# Patient Record
Sex: Female | Born: 1935 | Race: White | Hispanic: No | Smoking: Never smoker
Health system: Southern US, Community
[De-identification: ages and names within clinical notes are randomized; demographics above are authoritative.]

## PROBLEM LIST (undated history)

## (undated) DIAGNOSIS — R51 Headache: Secondary | ICD-10-CM

## (undated) DIAGNOSIS — F419 Anxiety disorder, unspecified: Secondary | ICD-10-CM

## (undated) DIAGNOSIS — E039 Hypothyroidism, unspecified: Secondary | ICD-10-CM

## (undated) DIAGNOSIS — Z9889 Other specified postprocedural states: Secondary | ICD-10-CM

## (undated) DIAGNOSIS — R42 Dizziness and giddiness: Secondary | ICD-10-CM

## (undated) DIAGNOSIS — R112 Nausea with vomiting, unspecified: Secondary | ICD-10-CM

## (undated) DIAGNOSIS — K219 Gastro-esophageal reflux disease without esophagitis: Secondary | ICD-10-CM

## (undated) DIAGNOSIS — J45909 Unspecified asthma, uncomplicated: Secondary | ICD-10-CM

## (undated) DIAGNOSIS — I10 Essential (primary) hypertension: Secondary | ICD-10-CM

## (undated) DIAGNOSIS — Z972 Presence of dental prosthetic device (complete) (partial): Secondary | ICD-10-CM

## (undated) DIAGNOSIS — M1990 Unspecified osteoarthritis, unspecified site: Secondary | ICD-10-CM

## (undated) HISTORY — PX: CARDIAC CATHETERIZATION: SHX172

## (undated) HISTORY — PX: TONSILLECTOMY: SUR1361

## (undated) HISTORY — PX: JOINT REPLACEMENT: SHX530

---

## 2004-03-23 ENCOUNTER — Ambulatory Visit: Payer: Self-pay | Admitting: Unknown Physician Specialty

## 2004-04-27 ENCOUNTER — Ambulatory Visit: Payer: Self-pay | Admitting: Unknown Physician Specialty

## 2004-04-28 ENCOUNTER — Ambulatory Visit: Payer: Self-pay | Admitting: Unknown Physician Specialty

## 2004-06-28 ENCOUNTER — Ambulatory Visit: Payer: Self-pay | Admitting: Unknown Physician Specialty

## 2004-09-29 ENCOUNTER — Ambulatory Visit: Payer: Self-pay | Admitting: Unknown Physician Specialty

## 2006-07-03 ENCOUNTER — Ambulatory Visit: Payer: Self-pay | Admitting: Specialist

## 2006-08-01 ENCOUNTER — Ambulatory Visit: Payer: Self-pay | Admitting: Unknown Physician Specialty

## 2006-11-05 ENCOUNTER — Ambulatory Visit: Payer: Self-pay | Admitting: Specialist

## 2006-11-05 ENCOUNTER — Other Ambulatory Visit: Payer: Self-pay

## 2006-11-20 ENCOUNTER — Inpatient Hospital Stay: Payer: Self-pay | Admitting: Specialist

## 2006-11-23 ENCOUNTER — Encounter: Payer: Self-pay | Admitting: Internal Medicine

## 2007-04-29 ENCOUNTER — Ambulatory Visit: Payer: Self-pay | Admitting: Unknown Physician Specialty

## 2007-05-29 ENCOUNTER — Ambulatory Visit: Payer: Self-pay | Admitting: Unknown Physician Specialty

## 2007-06-06 ENCOUNTER — Ambulatory Visit: Payer: Self-pay | Admitting: Unknown Physician Specialty

## 2007-08-06 ENCOUNTER — Ambulatory Visit: Payer: Self-pay | Admitting: Unknown Physician Specialty

## 2008-03-13 IMAGING — CR DG KNEE 1-2V*R*
1 series · 2 of 2 positions shown · non-contrast
Comparison: none

REASON FOR EXAM: post op
COMMENTS:   Bedside (portable):Y

PROCEDURE:     DXR - DXR KNEE RIGHT AP AND LATERAL  - November 20, 2006 [DATE]
RESULT:     Comparison: No available comparison exam.

[Series 1: view not recorded · 0.17mm/px · 2 of 2 slices shown]
[im 1/2]
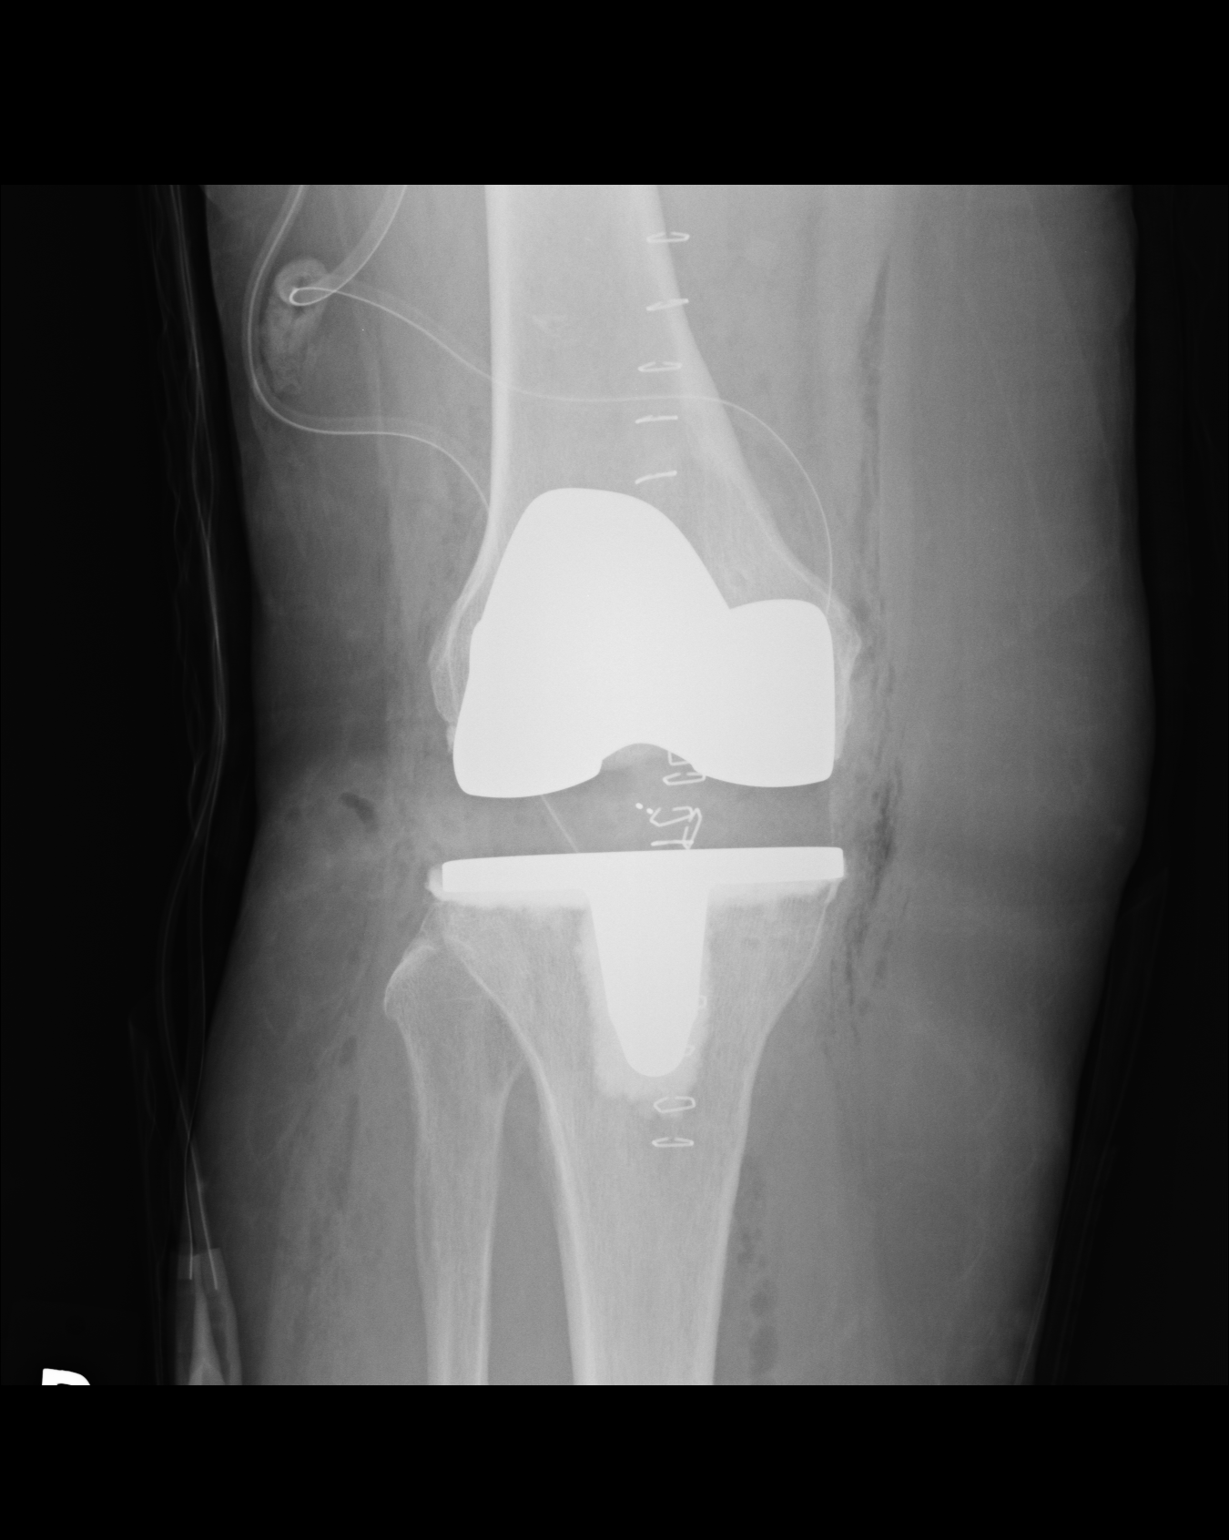
[im 2/2]
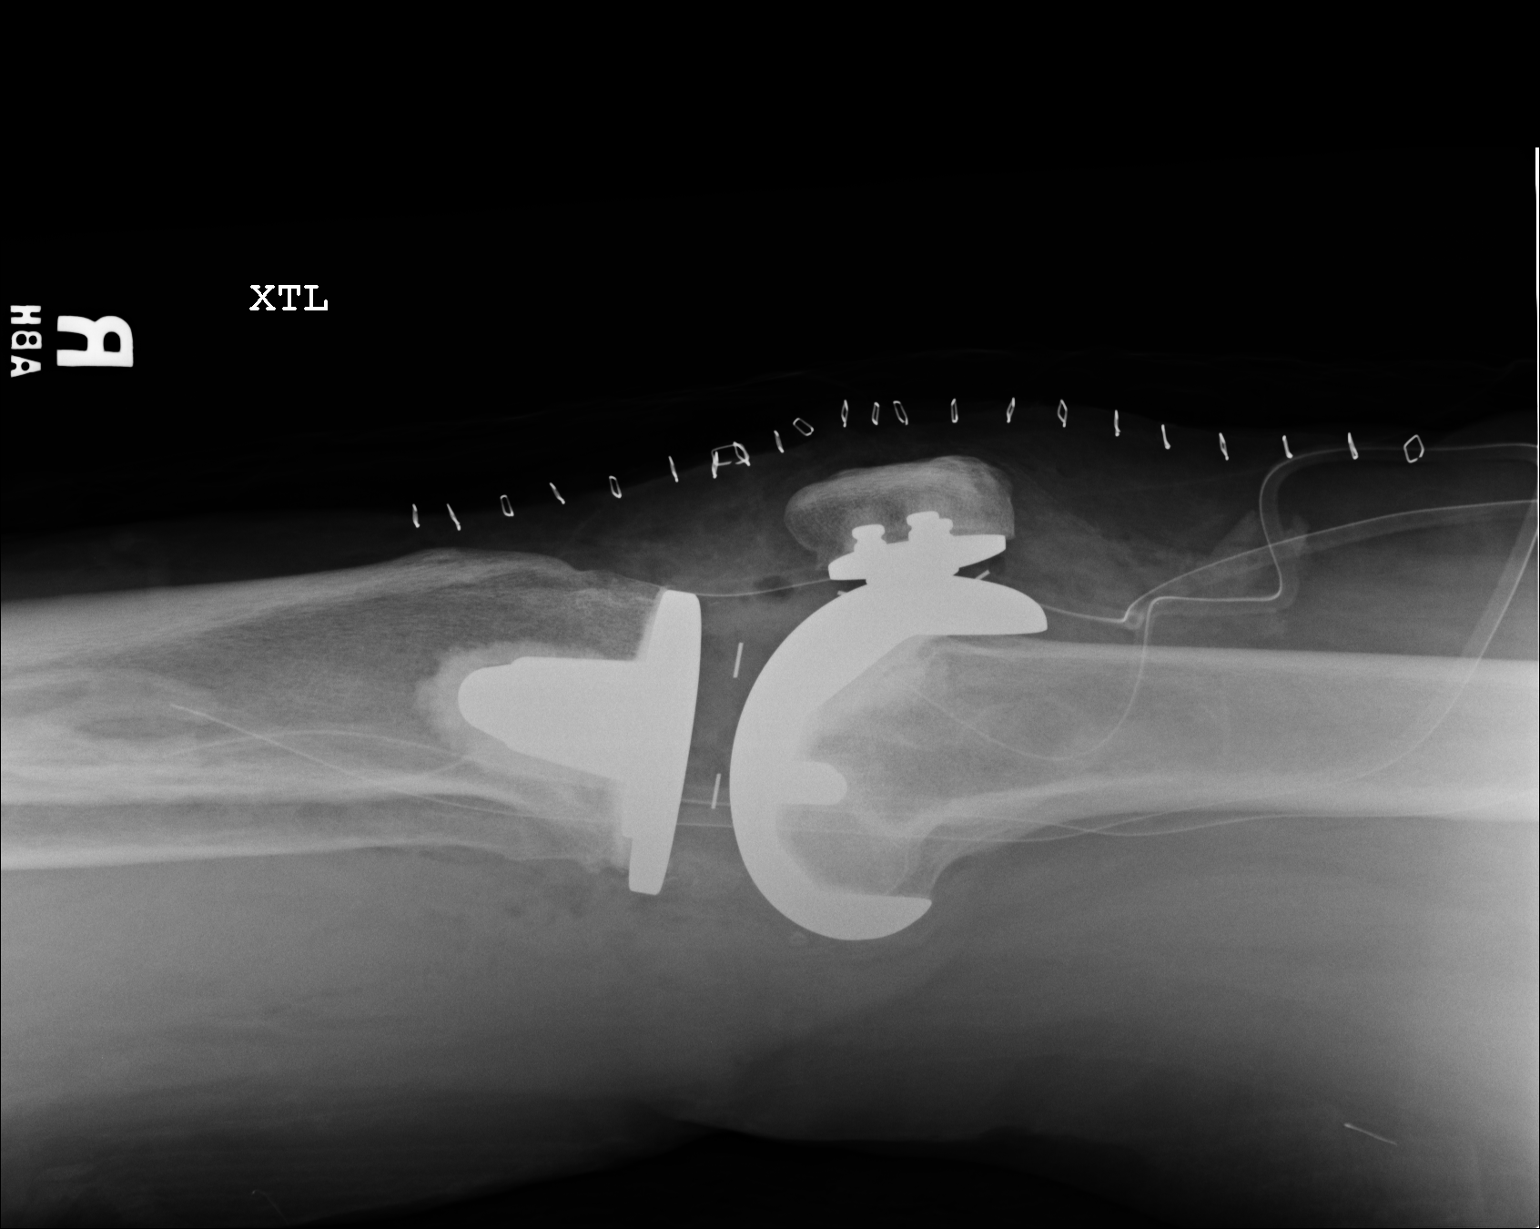

[2 of 2 positions shown; findings below may reference images not displayed]

FINDINGS: Two views of the right knee were obtained.

Right total knee arthroplasty is seen with hardware in good position and
alignment. Associated postoperative changes are noted with soft tissue gas,
2 surgical drains, and skin staples.
IMPRESSION: 1. Right total knee arthroplasty with associated post operative changes.

## 2008-08-11 ENCOUNTER — Ambulatory Visit: Payer: Self-pay | Admitting: Unknown Physician Specialty

## 2008-10-20 ENCOUNTER — Ambulatory Visit: Payer: Self-pay | Admitting: Unknown Physician Specialty

## 2008-10-22 ENCOUNTER — Ambulatory Visit: Payer: Self-pay | Admitting: Unknown Physician Specialty

## 2009-10-11 ENCOUNTER — Ambulatory Visit: Payer: Self-pay | Admitting: Unknown Physician Specialty

## 2010-10-18 ENCOUNTER — Ambulatory Visit: Payer: Self-pay | Admitting: Unknown Physician Specialty

## 2011-07-25 ENCOUNTER — Ambulatory Visit: Payer: Self-pay | Admitting: Physical Medicine and Rehabilitation

## 2011-10-19 ENCOUNTER — Ambulatory Visit: Payer: Self-pay | Admitting: Internal Medicine

## 2011-12-26 ENCOUNTER — Ambulatory Visit: Payer: Self-pay | Admitting: Specialist

## 2011-12-26 DIAGNOSIS — I10 Essential (primary) hypertension: Secondary | ICD-10-CM

## 2011-12-26 LAB — URINALYSIS, COMPLETE
Bacteria: NONE SEEN
Bilirubin,UR: NEGATIVE
Blood: NEGATIVE
Glucose,UR: NEGATIVE mg/dL (ref 0–75)
Ketone: NEGATIVE
Specific Gravity: 1.016 (ref 1.003–1.030)
Squamous Epithelial: 2
WBC UR: 22 /HPF (ref 0–5)

## 2011-12-26 LAB — CBC
HCT: 38.3 % (ref 35.0–47.0)
HGB: 13.2 g/dL (ref 12.0–16.0)
MCH: 33 pg (ref 26.0–34.0)
MCHC: 34.6 g/dL (ref 32.0–36.0)
MCV: 96 fL (ref 80–100)
RBC: 4.01 10*6/uL (ref 3.80–5.20)

## 2011-12-26 LAB — BASIC METABOLIC PANEL
Calcium, Total: 9.1 mg/dL (ref 8.5–10.1)
Chloride: 101 mmol/L (ref 98–107)
Co2: 29 mmol/L (ref 21–32)
Osmolality: 274 (ref 275–301)
Potassium: 4.1 mmol/L (ref 3.5–5.1)
Sodium: 135 mmol/L — ABNORMAL LOW (ref 136–145)

## 2012-01-02 ENCOUNTER — Inpatient Hospital Stay: Payer: Self-pay | Admitting: Specialist

## 2012-01-03 LAB — CBC WITH DIFFERENTIAL/PLATELET
Basophil #: 0 10*3/uL (ref 0.0–0.1)
Basophil %: 0.1 %
Eosinophil #: 0 10*3/uL (ref 0.0–0.7)
HCT: 30.3 % — ABNORMAL LOW (ref 35.0–47.0)
HGB: 10.4 g/dL — ABNORMAL LOW (ref 12.0–16.0)
Lymphocyte #: 1.5 10*3/uL (ref 1.0–3.6)
MCH: 32.6 pg (ref 26.0–34.0)
MCHC: 34.4 g/dL (ref 32.0–36.0)
MCV: 95 fL (ref 80–100)
Monocyte #: 1.2 x10 3/mm — ABNORMAL HIGH (ref 0.2–0.9)
Neutrophil #: 11.2 10*3/uL — ABNORMAL HIGH (ref 1.4–6.5)
RDW: 12.7 % (ref 11.5–14.5)

## 2012-01-03 LAB — BASIC METABOLIC PANEL
Anion Gap: 10 (ref 7–16)
Calcium, Total: 8.2 mg/dL — ABNORMAL LOW (ref 8.5–10.1)
Chloride: 97 mmol/L — ABNORMAL LOW (ref 98–107)
Glucose: 126 mg/dL — ABNORMAL HIGH (ref 65–99)
Osmolality: 269 (ref 275–301)
Potassium: 3.8 mmol/L (ref 3.5–5.1)
Sodium: 132 mmol/L — ABNORMAL LOW (ref 136–145)

## 2012-01-04 ENCOUNTER — Encounter: Payer: Self-pay | Admitting: Internal Medicine

## 2012-01-04 LAB — HEMOGLOBIN: HGB: 9.6 g/dL — ABNORMAL LOW (ref 12.0–16.0)

## 2012-01-14 ENCOUNTER — Encounter: Payer: Self-pay | Admitting: Internal Medicine

## 2012-03-22 ENCOUNTER — Ambulatory Visit: Payer: Self-pay | Admitting: Specialist

## 2012-04-01 ENCOUNTER — Ambulatory Visit: Payer: Self-pay | Admitting: Specialist

## 2012-04-01 LAB — CBC WITH DIFFERENTIAL/PLATELET
Basophil #: 0 10*3/uL (ref 0.0–0.1)
Basophil %: 0.6 %
Eosinophil #: 0.2 10*3/uL (ref 0.0–0.7)
HCT: 37.8 % (ref 35.0–47.0)
Lymphocyte %: 33 %
MCHC: 33.3 g/dL (ref 32.0–36.0)
Neutrophil %: 55.6 %
Platelet: 250 10*3/uL (ref 150–440)
RDW: 13.4 % (ref 11.5–14.5)
WBC: 7.4 10*3/uL (ref 3.6–11.0)

## 2012-04-01 LAB — BASIC METABOLIC PANEL
Anion Gap: 6 — ABNORMAL LOW (ref 7–16)
BUN: 20 mg/dL — ABNORMAL HIGH (ref 7–18)
Chloride: 102 mmol/L (ref 98–107)
Co2: 27 mmol/L (ref 21–32)
Creatinine: 1.28 mg/dL (ref 0.60–1.30)
EGFR (African American): 47 — ABNORMAL LOW
Sodium: 135 mmol/L — ABNORMAL LOW (ref 136–145)

## 2012-04-09 ENCOUNTER — Ambulatory Visit: Payer: Self-pay | Admitting: Specialist

## 2012-04-14 HISTORY — PX: SHOULDER ARTHROSCOPY WITH OPEN ROTATOR CUFF REPAIR: SHX6092

## 2012-10-21 ENCOUNTER — Ambulatory Visit: Payer: Self-pay | Admitting: Internal Medicine

## 2012-12-11 ENCOUNTER — Ambulatory Visit: Payer: Self-pay | Admitting: Physical Medicine and Rehabilitation

## 2012-12-25 ENCOUNTER — Other Ambulatory Visit: Payer: Self-pay | Admitting: Neurosurgery

## 2013-01-04 NOTE — Pre-Procedure Instructions (Signed)
Cynthia Mahoney  01/04/2013   Your procedure is scheduled on:  August 27  Report to [facility] at 06:30 AM.  Call this number if you have problems the morning of surgery: [phone]   Remember:   Do not eat food or drink liquids after midnight.   Take these medicines the morning of surgery with A SIP OF WATER: Eye drops, Gabapentin, Hydrocodone (if needed), Levothyroxine, Lorazepam (if needed), Metoprolol, Omeprazole,    STOP Multiple Vitamin, Meloxicam, Magnesium, Calcium, Asprin today  Do not take Aspirin, Aleve, Naproxen, Advil, Ibuprofen, Vitamin, Herbs, or Supplements starting today  Do not wear jewelry, make-up or nail polish.  Do not wear lotions, powders, or perfumes. You may wear deodorant.  Do not shave 48 hours prior to surgery. Men may shave face and neck.  Do not bring valuables to the hospital.  [facility] is not responsible                   for any belongings or valuables.  Contacts, dentures or bridgework may not be worn into surgery.  Leave suitcase in the car. After surgery it may be brought to your room.  For patients admitted to the hospital, checkout time is 11:00 AM the day of  discharge.   Special Instructions: Shower using CHG 2 nights before surgery and the night before surgery.  If you shower the day of surgery use CHG.  Use special wash - you have one bottle of CHG for all showers.  You should use approximately 1/3 of the bottle for each shower.   Please read over the following fact sheets that you were given: Pain Booklet, Coughing and Deep Breathing, Blood Transfusion Information and Surgical Site Infection Prevention

## 2013-01-06 ENCOUNTER — Encounter (HOSPITAL_COMMUNITY): Payer: Self-pay

## 2013-01-06 ENCOUNTER — Encounter (HOSPITAL_COMMUNITY)
Admission: RE | Admit: 2013-01-06 | Discharge: 2013-01-06 | Disposition: A | Discharge status: Home / Self Care | Payer: Medicare Other | Source: Ambulatory Visit | Attending: Neurosurgery | Admitting: Neurosurgery

## 2013-01-06 ENCOUNTER — Ambulatory Visit (HOSPITAL_COMMUNITY)
Admission: RE | Admit: 2013-01-06 | Discharge: 2013-01-06 | Disposition: A | Discharge status: Home / Self Care | Payer: Medicare Other | Source: Ambulatory Visit | Attending: Anesthesiology | Admitting: Anesthesiology

## 2013-01-06 DIAGNOSIS — I10 Essential (primary) hypertension: Secondary | ICD-10-CM | POA: Insufficient documentation

## 2013-01-06 HISTORY — DX: Anxiety disorder, unspecified: F41.9

## 2013-01-06 HISTORY — DX: Other specified postprocedural states: Z98.890

## 2013-01-06 HISTORY — DX: Hypothyroidism, unspecified: E03.9

## 2013-01-06 HISTORY — DX: Unspecified osteoarthritis, unspecified site: M19.90

## 2013-01-06 HISTORY — DX: Headache: R51

## 2013-01-06 HISTORY — DX: Nausea with vomiting, unspecified: R11.2

## 2013-01-06 HISTORY — DX: Gastro-esophageal reflux disease without esophagitis: K21.9

## 2013-01-06 HISTORY — DX: Essential (primary) hypertension: I10

## 2013-01-06 LAB — BASIC METABOLIC PANEL
CO2: 27 mEq/L (ref 19–32)
Calcium: 9.9 mg/dL (ref 8.4–10.5)
GFR calc Af Amer: 65 mL/min — ABNORMAL LOW (ref >90)
GFR calc non Af Amer: 56 mL/min — ABNORMAL LOW (ref >90)
Sodium: 131 mEq/L — ABNORMAL LOW (ref 135–145)

## 2013-01-06 LAB — TYPE AND SCREEN
ABO/RH(D): A POS
Antibody Screen: NEGATIVE

## 2013-01-06 LAB — CBC
MCV: 91.5 fL (ref 78.0–100.0)
Platelets: 324 10*3/uL (ref 150–400)
RBC: 4.11 MIL/uL (ref 3.87–5.11)
WBC: 8.1 10*3/uL (ref 4.0–10.5)

## 2013-01-06 LAB — SURGICAL PCR SCREEN: Staphylococcus aureus: POSITIVE — AB

## 2013-01-06 LAB — ABO/RH: ABO/RH(D): A POS

## 2013-01-07 ENCOUNTER — Other Ambulatory Visit (HOSPITAL_COMMUNITY): Payer: Medicare Other

## 2013-01-07 ENCOUNTER — Encounter (HOSPITAL_COMMUNITY): Payer: Self-pay | Admitting: Pharmacy Technician

## 2013-01-07 ENCOUNTER — Other Ambulatory Visit: Payer: Self-pay | Admitting: Neurosurgery

## 2013-01-07 MED ORDER — DEXTROSE 5 % IV SOLN: 2.0000 g | INTRAVENOUS | Status: DC

## 2013-01-08 ENCOUNTER — Inpatient Hospital Stay (HOSPITAL_COMMUNITY): Payer: Medicare Other | Admitting: *Deleted

## 2013-01-08 ENCOUNTER — Inpatient Hospital Stay (HOSPITAL_COMMUNITY): Payer: Medicare Other

## 2013-01-08 ENCOUNTER — Inpatient Hospital Stay (HOSPITAL_COMMUNITY)
Admission: RE | Admit: 2013-01-08 | Discharge: 2013-01-14 | DRG: 460 | Disposition: A | Discharge status: Skilled Nursing Facility | Payer: Medicare Other | Source: Ambulatory Visit | Attending: Neurosurgery | Admitting: Neurosurgery

## 2013-01-08 ENCOUNTER — Encounter (HOSPITAL_COMMUNITY): Admission: RE | Payer: Self-pay | Source: Ambulatory Visit

## 2013-01-08 ENCOUNTER — Encounter (HOSPITAL_COMMUNITY)
Admission: RE | Disposition: A | Discharge status: Skilled Nursing Facility | Payer: Self-pay | Source: Ambulatory Visit | Attending: Neurosurgery

## 2013-01-08 ENCOUNTER — Encounter (HOSPITAL_COMMUNITY): Payer: Self-pay | Admitting: *Deleted

## 2013-01-08 ENCOUNTER — Inpatient Hospital Stay (HOSPITAL_COMMUNITY): Admission: RE | Admit: 2013-01-08 | Payer: Medicare Other | Source: Ambulatory Visit | Admitting: Neurosurgery

## 2013-01-08 DIAGNOSIS — F411 Generalized anxiety disorder: Secondary | ICD-10-CM | POA: Diagnosis present

## 2013-01-08 DIAGNOSIS — M48062 Spinal stenosis, lumbar region with neurogenic claudication: Principal | ICD-10-CM | POA: Diagnosis present

## 2013-01-08 DIAGNOSIS — E039 Hypothyroidism, unspecified: Secondary | ICD-10-CM | POA: Diagnosis present

## 2013-01-08 DIAGNOSIS — I10 Essential (primary) hypertension: Secondary | ICD-10-CM | POA: Diagnosis present

## 2013-01-08 DIAGNOSIS — Z96659 Presence of unspecified artificial knee joint: Secondary | ICD-10-CM

## 2013-01-08 DIAGNOSIS — K219 Gastro-esophageal reflux disease without esophagitis: Secondary | ICD-10-CM | POA: Diagnosis present

## 2013-01-08 DIAGNOSIS — M129 Arthropathy, unspecified: Secondary | ICD-10-CM | POA: Diagnosis present

## 2013-01-08 DIAGNOSIS — Q762 Congenital spondylolisthesis: Secondary | ICD-10-CM

## 2013-01-08 SURGERY — POSTERIOR LUMBAR FUSION 1 LEVEL
Anesthesia: General

## 2013-01-08 SURGERY — POSTERIOR LUMBAR FUSION 1 LEVEL
Anesthesia: General | Site: Back | Wound class: Clean

## 2013-01-08 MED ORDER — ONDANSETRON HCL 4 MG/2ML IJ SOLN: 4.0000 mg | Freq: Four times a day (QID) | INTRAMUSCULAR | Status: DC | PRN

## 2013-01-08 MED ORDER — GABAPENTIN 300 MG PO CAPS: 300.0000 mg | ORAL_CAPSULE | Freq: Two times a day (BID) | ORAL | Status: DC

## 2013-01-08 MED ORDER — OXYCODONE-ACETAMINOPHEN 5-325 MG PO TABS
1.0000 | ORAL_TABLET | ORAL | Status: DC | PRN
  Administered 2013-01-09 – 2013-01-13 (×5): 2 via ORAL
  Administered 2013-01-13: 1 via ORAL
  Administered 2013-01-14 (×4): 2 via ORAL
  Filled 2013-01-08 (×11): qty 2

## 2013-01-08 MED ORDER — LACTATED RINGERS IV SOLN
INTRAVENOUS | Status: DC
  Administered 2013-01-08: 08:00:00 via INTRAVENOUS

## 2013-01-08 MED ORDER — TRIAMTERENE-HCTZ 37.5-25 MG PO CAPS
1.0000 | ORAL_CAPSULE | Freq: Every day | ORAL | Status: DC
  Administered 2013-01-08 – 2013-01-14 (×7): 1 via ORAL
  Filled 2013-01-08 (×7): qty 1

## 2013-01-08 MED ORDER — ARTIFICIAL TEARS OP OINT
TOPICAL_OINTMENT | OPHTHALMIC | Status: DC | PRN
  Administered 2013-01-08: 1 via OPHTHALMIC

## 2013-01-08 MED ORDER — ACETAMINOPHEN 650 MG RE SUPP: 650.0000 mg | RECTAL | Status: DC | PRN

## 2013-01-08 MED ORDER — HYDROMORPHONE HCL PF 1 MG/ML IJ SOLN
INTRAMUSCULAR | Status: DC | PRN
  Administered 2013-01-08: 1 mg via INTRAVENOUS

## 2013-01-08 MED ORDER — SODIUM CHLORIDE 0.9 % IJ SOLN: 9.0000 mL | INTRAMUSCULAR | Status: DC | PRN

## 2013-01-08 MED ORDER — MIDAZOLAM HCL 5 MG/5ML IJ SOLN
INTRAMUSCULAR | Status: DC | PRN
  Administered 2013-01-08: 2 mg via INTRAVENOUS

## 2013-01-08 MED ORDER — PHENYLEPHRINE HCL 10 MG/ML IJ SOLN
INTRAMUSCULAR | Status: DC | PRN
  Administered 2013-01-08: 40 ug via INTRAVENOUS
  Administered 2013-01-08: 80 ug via INTRAVENOUS

## 2013-01-08 MED ORDER — DIPHENHYDRAMINE HCL 12.5 MG/5ML PO ELIX: 12.5000 mg | ORAL_SOLUTION | Freq: Four times a day (QID) | ORAL | Status: DC | PRN

## 2013-01-08 MED ORDER — BACITRACIN ZINC 500 UNIT/GM EX OINT
TOPICAL_OINTMENT | CUTANEOUS | Status: DC | PRN
  Administered 2013-01-08: 1 via TOPICAL

## 2013-01-08 MED ORDER — CALCIUM CARBONATE 600 MG PO TABS
600.0000 mg | ORAL_TABLET | Freq: Every day | ORAL | Status: DC
  Filled 2013-01-08: qty 1

## 2013-01-08 MED ORDER — ZOLPIDEM TARTRATE 5 MG PO TABS
5.0000 mg | ORAL_TABLET | Freq: Every evening | ORAL | Status: DC | PRN
  Administered 2013-01-08 – 2013-01-13 (×6): 5 mg via ORAL
  Filled 2013-01-08 (×6): qty 1

## 2013-01-08 MED ORDER — ALUM & MAG HYDROXIDE-SIMETH 200-200-20 MG/5ML PO SUSP
30.0000 mL | Freq: Four times a day (QID) | ORAL | Status: DC | PRN
  Administered 2013-01-08 – 2013-01-13 (×4): 30 mL via ORAL
  Filled 2013-01-08 (×4): qty 30

## 2013-01-08 MED ORDER — BUPIVACAINE LIPOSOME 1.3 % IJ SUSP
20.0000 mL | Freq: Once | INTRAMUSCULAR | Status: DC
  Filled 2013-01-08: qty 20

## 2013-01-08 MED ORDER — LEVOTHYROXINE SODIUM 88 MCG PO TABS
88.0000 ug | ORAL_TABLET | Freq: Every day | ORAL | Status: DC
  Administered 2013-01-09 – 2013-01-14 (×6): 88 ug via ORAL
  Filled 2013-01-08 (×7): qty 1

## 2013-01-08 MED ORDER — GABAPENTIN 600 MG PO TABS
600.0000 mg | ORAL_TABLET | Freq: Every day | ORAL | Status: DC
  Administered 2013-01-09 – 2013-01-14 (×6): 600 mg via ORAL
  Filled 2013-01-08 (×6): qty 1

## 2013-01-08 MED ORDER — THROMBIN 20000 UNITS EX SOLR
CUTANEOUS | Status: DC | PRN
  Administered 2013-01-08 (×2): via TOPICAL

## 2013-01-08 MED ORDER — LACTATED RINGERS IV SOLN
INTRAVENOUS | Status: DC | PRN
  Administered 2013-01-08 (×2): via INTRAVENOUS

## 2013-01-08 MED ORDER — POTASSIUM CHLORIDE ER 10 MEQ PO TBCR
10.0000 meq | EXTENDED_RELEASE_TABLET | Freq: Every day | ORAL | Status: DC
  Administered 2013-01-09 – 2013-01-14 (×6): 10 meq via ORAL
  Filled 2013-01-08 (×7): qty 1

## 2013-01-08 MED ORDER — DORZOLAMIDE HCL-TIMOLOL MAL 2-0.5 % OP SOLN
1.0000 [drp] | Freq: Two times a day (BID) | OPHTHALMIC | Status: DC
  Administered 2013-01-08 – 2013-01-14 (×9): 1 [drp] via OPHTHALMIC
  Filled 2013-01-08: qty 10

## 2013-01-08 MED ORDER — LIDOCAINE HCL (CARDIAC) 20 MG/ML IV SOLN
INTRAVENOUS | Status: DC | PRN
  Administered 2013-01-08: 50 mg via INTRAVENOUS

## 2013-01-08 MED ORDER — SIMVASTATIN 20 MG PO TABS
20.0000 mg | ORAL_TABLET | Freq: Every day | ORAL | Status: DC
  Administered 2013-01-09 – 2013-01-13 (×5): 20 mg via ORAL
  Filled 2013-01-08 (×6): qty 1

## 2013-01-08 MED ORDER — DOCUSATE SODIUM 100 MG PO CAPS
100.0000 mg | ORAL_CAPSULE | Freq: Two times a day (BID) | ORAL | Status: DC
  Administered 2013-01-08 – 2013-01-14 (×12): 100 mg via ORAL
  Filled 2013-01-08 (×10): qty 1

## 2013-01-08 MED ORDER — DIAZEPAM 5 MG PO TABS
ORAL_TABLET | ORAL | Status: AC
  Filled 2013-01-08: qty 1

## 2013-01-08 MED ORDER — PHENOL 1.4 % MT LIQD
1.0000 | OROMUCOSAL | Status: DC | PRN
Start: 2013-01-08 — End: 2013-01-14

## 2013-01-08 MED ORDER — ONDANSETRON HCL 4 MG/2ML IJ SOLN
4.0000 mg | INTRAMUSCULAR | Status: DC | PRN
  Administered 2013-01-09: 4 mg via INTRAVENOUS
  Filled 2013-01-08: qty 2

## 2013-01-08 MED ORDER — HYDROMORPHONE HCL PF 1 MG/ML IJ SOLN
INTRAMUSCULAR | Status: AC
  Filled 2013-01-08: qty 1

## 2013-01-08 MED ORDER — FENTANYL CITRATE 0.05 MG/ML IJ SOLN
INTRAMUSCULAR | Status: DC | PRN
  Administered 2013-01-08: 100 ug via INTRAVENOUS
  Administered 2013-01-08 (×2): 50 ug via INTRAVENOUS
  Administered 2013-01-08: 100 ug via INTRAVENOUS
  Administered 2013-01-08 (×4): 50 ug via INTRAVENOUS
  Administered 2013-01-08: 100 ug via INTRAVENOUS

## 2013-01-08 MED ORDER — CALCIUM CARBONATE 1250 (500 CA) MG PO TABS
1.0000 | ORAL_TABLET | Freq: Every day | ORAL | Status: DC
  Administered 2013-01-09 – 2013-01-14 (×6): 500 mg via ORAL
  Filled 2013-01-08 (×7): qty 1

## 2013-01-08 MED ORDER — ADULT MULTIVITAMIN W/MINERALS CH
1.0000 | ORAL_TABLET | Freq: Every day | ORAL | Status: DC
  Administered 2013-01-09 – 2013-01-14 (×6): 1 via ORAL
  Filled 2013-01-08 (×7): qty 1

## 2013-01-08 MED ORDER — BUPIVACAINE LIPOSOME 1.3 % IJ SUSP
INTRAMUSCULAR | Status: DC | PRN
  Administered 2013-01-08: 20 mL

## 2013-01-08 MED ORDER — DEXAMETHASONE SODIUM PHOSPHATE 4 MG/ML IJ SOLN
INTRAMUSCULAR | Status: DC | PRN
  Administered 2013-01-08: 4 mg via INTRAVENOUS

## 2013-01-08 MED ORDER — VANCOMYCIN HCL IN DEXTROSE 1-5 GM/200ML-% IV SOLN
INTRAVENOUS | Status: AC
  Administered 2013-01-08: 1000 mg via INTRAVENOUS
  Filled 2013-01-08: qty 200

## 2013-01-08 MED ORDER — NALOXONE HCL 0.4 MG/ML IJ SOLN: 0.4000 mg | INTRAMUSCULAR | Status: DC | PRN

## 2013-01-08 MED ORDER — PANTOPRAZOLE SODIUM 40 MG PO TBEC
40.0000 mg | DELAYED_RELEASE_TABLET | Freq: Every day | ORAL | Status: DC
  Administered 2013-01-09 – 2013-01-14 (×6): 40 mg via ORAL
  Filled 2013-01-08 (×5): qty 1

## 2013-01-08 MED ORDER — GLYCOPYRROLATE 0.2 MG/ML IJ SOLN
INTRAMUSCULAR | Status: DC | PRN
  Administered 2013-01-08: 0.6 mg via INTRAVENOUS

## 2013-01-08 MED ORDER — 0.9 % SODIUM CHLORIDE (POUR BTL) OPTIME
TOPICAL | Status: DC | PRN
  Administered 2013-01-08: 1000 mL

## 2013-01-08 MED ORDER — DIAZEPAM 5 MG PO TABS
5.0000 mg | ORAL_TABLET | Freq: Four times a day (QID) | ORAL | Status: DC | PRN
  Administered 2013-01-08 – 2013-01-13 (×7): 5 mg via ORAL
  Filled 2013-01-08 (×6): qty 1

## 2013-01-08 MED ORDER — VANCOMYCIN HCL IN DEXTROSE 750-5 MG/150ML-% IV SOLN
750.0000 mg | Freq: Two times a day (BID) | INTRAVENOUS | Status: DC
  Administered 2013-01-08 – 2013-01-09 (×3): 750 mg via INTRAVENOUS
  Filled 2013-01-08 (×5): qty 150

## 2013-01-08 MED ORDER — SODIUM CHLORIDE 0.9 % IR SOLN
Status: DC | PRN
  Administered 2013-01-08: 12:00:00

## 2013-01-08 MED ORDER — LATANOPROST 0.005 % OP SOLN
1.0000 [drp] | Freq: Every day | OPHTHALMIC | Status: DC
  Administered 2013-01-08 – 2013-01-13 (×5): 1 [drp] via OPHTHALMIC
  Filled 2013-01-08: qty 2.5

## 2013-01-08 MED ORDER — GABAPENTIN 300 MG PO CAPS
300.0000 mg | ORAL_CAPSULE | Freq: Every day | ORAL | Status: DC
  Administered 2013-01-09 – 2013-01-13 (×5): 300 mg via ORAL
  Filled 2013-01-08 (×7): qty 1

## 2013-01-08 MED ORDER — ONDANSETRON HCL 4 MG/2ML IJ SOLN
INTRAMUSCULAR | Status: DC | PRN
  Administered 2013-01-08: 4 mg via INTRAVENOUS

## 2013-01-08 MED ORDER — METOPROLOL TARTRATE 25 MG PO TABS
25.0000 mg | ORAL_TABLET | Freq: Two times a day (BID) | ORAL | Status: DC
  Administered 2013-01-08 – 2013-01-14 (×11): 25 mg via ORAL
  Filled 2013-01-08 (×13): qty 1

## 2013-01-08 MED ORDER — MORPHINE SULFATE (PF) 1 MG/ML IV SOLN
INTRAVENOUS | Status: AC
  Filled 2013-01-08: qty 25

## 2013-01-08 MED ORDER — HYDROMORPHONE HCL PF 1 MG/ML IJ SOLN
0.2500 mg | INTRAMUSCULAR | Status: DC | PRN
  Administered 2013-01-08 (×4): 0.5 mg via INTRAVENOUS

## 2013-01-08 MED ORDER — ROCURONIUM BROMIDE 100 MG/10ML IV SOLN
INTRAVENOUS | Status: DC | PRN
  Administered 2013-01-08: 10 mg via INTRAVENOUS
  Administered 2013-01-08: 40 mg via INTRAVENOUS
  Administered 2013-01-08: 25 mg via INTRAVENOUS
  Administered 2013-01-08: 10 mg via INTRAVENOUS

## 2013-01-08 MED ORDER — MENTHOL 3 MG MT LOZG
1.0000 | LOZENGE | OROMUCOSAL | Status: DC | PRN
  Administered 2013-01-09: 3 mg via ORAL
  Filled 2013-01-08: qty 9

## 2013-01-08 MED ORDER — ACETAMINOPHEN 325 MG PO TABS: 650.0000 mg | ORAL_TABLET | ORAL | Status: DC | PRN

## 2013-01-08 MED ORDER — PROPOFOL 10 MG/ML IV BOLUS
INTRAVENOUS | Status: DC | PRN
  Administered 2013-01-08: 130 mg via INTRAVENOUS

## 2013-01-08 MED ORDER — DIPHENHYDRAMINE HCL 50 MG/ML IJ SOLN: 12.5000 mg | Freq: Four times a day (QID) | INTRAMUSCULAR | Status: DC | PRN

## 2013-01-08 MED ORDER — LACTATED RINGERS IV SOLN
INTRAVENOUS | Status: DC
  Administered 2013-01-08: 21:00:00 via INTRAVENOUS

## 2013-01-08 MED ORDER — MORPHINE SULFATE (PF) 1 MG/ML IV SOLN
INTRAVENOUS | Status: DC
  Administered 2013-01-08: 17:00:00 via INTRAVENOUS
  Administered 2013-01-08: 6 mg via INTRAVENOUS
  Administered 2013-01-08: 16.5 mg via INTRAVENOUS
  Administered 2013-01-09: 3 mg via INTRAVENOUS
  Administered 2013-01-09: 6 mg via INTRAVENOUS
  Administered 2013-01-09: 3 mg via INTRAVENOUS
  Filled 2013-01-08: qty 25

## 2013-01-08 MED ORDER — NEOSTIGMINE METHYLSULFATE 1 MG/ML IJ SOLN
INTRAMUSCULAR | Status: DC | PRN
  Administered 2013-01-08: 4 mg via INTRAVENOUS

## 2013-01-08 MED ORDER — HYDROCODONE-ACETAMINOPHEN 5-325 MG PO TABS
1.0000 | ORAL_TABLET | ORAL | Status: DC | PRN
  Administered 2013-01-08: 1 via ORAL
  Administered 2013-01-09 – 2013-01-12 (×11): 2 via ORAL
  Administered 2013-01-13: 1 via ORAL
  Administered 2013-01-13: 2 via ORAL
  Filled 2013-01-08 (×3): qty 2
  Filled 2013-01-08: qty 1
  Filled 2013-01-08 (×10): qty 2

## 2013-01-08 MED ORDER — MORPHINE SULFATE 2 MG/ML IJ SOLN
1.0000 mg | INTRAMUSCULAR | Status: DC | PRN
  Administered 2013-01-09: 2 mg via INTRAVENOUS
  Filled 2013-01-08: qty 1

## 2013-01-08 SURGICAL SUPPLY — 72 items
BAG DECANTER FOR FLEXI CONT (MISCELLANEOUS) ×2 IMPLANT
BENZOIN TINCTURE PRP APPL 2/3 (GAUZE/BANDAGES/DRESSINGS) ×2 IMPLANT
BLADE SURG ROTATE 9660 (MISCELLANEOUS) IMPLANT
BRUSH SCRUB EZ PLAIN DRY (MISCELLANEOUS) ×2 IMPLANT
BUR ACORN 6.0 (BURR) ×2 IMPLANT
BUR ACORN 7.5 PRECISION (BURR) ×2 IMPLANT
BUR MATCHSTICK NEURO 3.0 LAGG (BURR) ×2 IMPLANT
CANISTER SUCTION 2500CC (MISCELLANEOUS) ×2 IMPLANT
CAP REVERE LOCKING (Cap) ×8 IMPLANT
CLOTH BEACON ORANGE TIMEOUT ST (SAFETY) ×2 IMPLANT
CONT SPEC 4OZ CLIKSEAL STRL BL (MISCELLANEOUS) ×4 IMPLANT
COVER BACK TABLE 24X17X13 BIG (DRAPES) IMPLANT
COVER TABLE BACK 60X90 (DRAPES) ×2 IMPLANT
DRAPE C-ARM 42X72 X-RAY (DRAPES) ×4 IMPLANT
DRAPE LAPAROTOMY 100X72X124 (DRAPES) ×2 IMPLANT
DRAPE POUCH INSTRU U-SHP 10X18 (DRAPES) ×2 IMPLANT
DRAPE PROXIMA HALF (DRAPES) ×2 IMPLANT
DRAPE SURG 17X23 STRL (DRAPES) ×8 IMPLANT
ELECT BLADE 4.0 EZ CLEAN MEGAD (MISCELLANEOUS) ×2
ELECT REM PT RETURN 9FT ADLT (ELECTROSURGICAL) ×2
ELECTRODE BLDE 4.0 EZ CLN MEGD (MISCELLANEOUS) ×1 IMPLANT
ELECTRODE REM PT RTRN 9FT ADLT (ELECTROSURGICAL) ×1 IMPLANT
EVACUATOR 3/16  PVC DRAIN (DRAIN) ×1
EVACUATOR 3/16 PVC DRAIN (DRAIN) ×1 IMPLANT
GAUZE SPONGE 4X4 16PLY XRAY LF (GAUZE/BANDAGES/DRESSINGS) ×2 IMPLANT
GLOVE BIO SURGEON STRL SZ8.5 (GLOVE) ×4 IMPLANT
GLOVE BIOGEL M 6.5 STRL (GLOVE) ×2 IMPLANT
GLOVE BIOGEL PI IND STRL 6.5 (GLOVE) ×1 IMPLANT
GLOVE BIOGEL PI IND STRL 8.5 (GLOVE) ×1 IMPLANT
GLOVE BIOGEL PI INDICATOR 6.5 (GLOVE) ×1
GLOVE BIOGEL PI INDICATOR 8.5 (GLOVE) ×1
GLOVE ECLIPSE 6.5 STRL STRAW (GLOVE) ×2 IMPLANT
GLOVE EXAM NITRILE LRG STRL (GLOVE) IMPLANT
GLOVE EXAM NITRILE MD LF STRL (GLOVE) ×2 IMPLANT
GLOVE EXAM NITRILE XL STR (GLOVE) IMPLANT
GLOVE EXAM NITRILE XS STR PU (GLOVE) IMPLANT
GLOVE SS BIOGEL STRL SZ 8 (GLOVE) ×2 IMPLANT
GLOVE SUPERSENSE BIOGEL SZ 8 (GLOVE) ×2
GLOVE SURG SS PI 8.0 STRL IVOR (GLOVE) ×6 IMPLANT
GOWN BRE IMP SLV AUR LG STRL (GOWN DISPOSABLE) ×4 IMPLANT
GOWN BRE IMP SLV AUR XL STRL (GOWN DISPOSABLE) ×4 IMPLANT
GOWN STRL REIN 2XL LVL4 (GOWN DISPOSABLE) ×2 IMPLANT
KIT BASIN OR (CUSTOM PROCEDURE TRAY) ×2 IMPLANT
KIT ROOM TURNOVER OR (KITS) ×2 IMPLANT
NEEDLE HYPO 21X1.5 SAFETY (NEEDLE) ×2 IMPLANT
NEEDLE HYPO 22GX1.5 SAFETY (NEEDLE) ×2 IMPLANT
NS IRRIG 1000ML POUR BTL (IV SOLUTION) ×2 IMPLANT
PACK FOAM VITOSS 10CC (Orthopedic Implant) ×2 IMPLANT
PACK LAMINECTOMY NEURO (CUSTOM PROCEDURE TRAY) ×2 IMPLANT
PAD ARMBOARD 7.5X6 YLW CONV (MISCELLANEOUS) ×6 IMPLANT
PATTIES SURGICAL .5 X1 (DISPOSABLE) IMPLANT
PATTIES SURGICAL 1X1 (DISPOSABLE) ×2 IMPLANT
PUTTY 10ML ACTIFUSE ABX (Putty) ×2 IMPLANT
ROD CURVED REVERE 6.35X50MM (Rod) ×4 IMPLANT
SCREW 7.5X50MM (Screw) IMPLANT
SCREW REVERE 6.5X50MM (Screw) ×8 IMPLANT
SPACER SUSTAIN O 10X26 8MM (Spacer) ×4 IMPLANT
SPONGE GAUZE 4X4 12PLY (GAUZE/BANDAGES/DRESSINGS) ×2 IMPLANT
SPONGE LAP 4X18 X RAY DECT (DISPOSABLE) IMPLANT
SPONGE NEURO XRAY DETECT 1X3 (DISPOSABLE) IMPLANT
SPONGE SURGIFOAM ABS GEL 100 (HEMOSTASIS) ×2 IMPLANT
STRIP CLOSURE SKIN 1/2X4 (GAUZE/BANDAGES/DRESSINGS) ×2 IMPLANT
SUT VIC AB 1 CT1 18XBRD ANBCTR (SUTURE) ×2 IMPLANT
SUT VIC AB 1 CT1 8-18 (SUTURE) ×2
SUT VIC AB 2-0 CP2 18 (SUTURE) ×4 IMPLANT
SYR 20CC LL (SYRINGE) ×2 IMPLANT
SYR 20ML ECCENTRIC (SYRINGE) ×2 IMPLANT
TAPE CLOTH SURG 4X10 WHT LF (GAUZE/BANDAGES/DRESSINGS) ×2 IMPLANT
TOWEL OR 17X24 6PK STRL BLUE (TOWEL DISPOSABLE) ×2 IMPLANT
TOWEL OR 17X26 10 PK STRL BLUE (TOWEL DISPOSABLE) ×2 IMPLANT
TRAY FOLEY CATH 14FRSI W/METER (CATHETERS) ×2 IMPLANT
WATER STERILE IRR 1000ML POUR (IV SOLUTION) ×2 IMPLANT

## 2013-01-08 NOTE — Transfer of Care (Signed)
Immediate Anesthesia Transfer of Care Note  Patient: Cynthia Mahoney  Procedure(s) Performed: Procedure(s) with comments: Lumbar two-three, Lumbar three-four, Lumbar four-five laminectomies with Lumbar four-five posterior lumbar interbody fusion with interbody prothesis posterior lateral arthrodesis and posterior nonsegmental instrumentation (N/A) - Lumbar two-three, Lumbar three-four, Lumbar four-five laminectomies with Lumbar four-five posterior lumbar interbody fusion with interbody prothesis posterior lateral arthrodesis and posterior nonsegmental instrumentation  Patient Location: PACU  Anesthesia Type:General  Level of Consciousness: patient cooperative and responds to stimulation  Airway & Oxygen Therapy: Patient Spontanous Breathing and Patient connected to nasal cannula oxygen  Post-op Assessment: Report given to PACU RN and Post -op Vital signs reviewed and stable  Post vital signs: Reviewed and stable  Complications: No apparent anesthesia complications

## 2013-01-08 NOTE — Op Note (Signed)
Brief history: The patient is a 78 year old white female who complains of back and leg pain consistent with neurogenic claudication. She was worked up with a lumbar MRI and lumbar x-rays which demonstrated multilevel spinal stenosis with a spondylolisthesis at L4-5. I discussed the various treatment options with the patient including surgery. The patient has weighed the risks, benefits, and alternatives surgery and decided proceed with a lumbar decompression, instrumentation, and fusion.  Preoperative diagnosis: L2-3, L3-4 and L4-5 spinal stenosis, L4-5 spondylolisthesis, L4-5 Degenerative disc disease, spinal stenosis compressing both the L3, L4 and L5 nerve roots; lumbago; lumbar radiculopathy, neurogenic claudication  Postoperative diagnosis: The same  Procedure: L2, L3 and L4 laminectomy/foraminotomies to decompress the bilateral L3, L4 and L5 nerve roots(the work required to do this was in addition to the work required to do the posterior lumbar interbody fusion because of the patient's spinal stenosis, facet arthropathy. Etc. requiring a wide decompression of the nerve roots.); L4-5 posterior lumbar interbody fusion with local morselized autograft bone and Actifusebone graft extender; insertion of interbody prosthesis at L4-5 (globus peek interbody prosthesis); posterior nonsegmental instrumentation from L4 to L5 with globus titanium pedicle screws and rods; posterior lateral arthrodesis at L4-5 with local morselized autograft bone and Vitoss bone graft extender.  Surgeon: Dr. Delma Officer  Asst.: None  Anesthesia: Gen. endotracheal  Estimated blood loss: 300 cc  Drains: One medium Hemovac  Complications: None  Description of procedure: The patient was brought to the operating room by the anesthesia team. General endotracheal anesthesia was induced. The patient was turned to the prone position on the Wilson frame. The patient's lumbosacral region was then prepared with Betadine scrub and  Betadine solution. Sterile drapes were applied.  I then injected the area to be incised with Marcaine with epinephrine solution. I then used the scalpel to make a linear midline incision over the L2-3, L3-4 and L4-5 interspace. I then used electrocautery to perform a bilateral subperiosteal dissection exposing the spinous process and lamina of L2-L5. We then obtained intraoperative radiograph to confirm our location. We then inserted the Verstrac retractor to provide exposure. I then used the scalpel to incise the interspinous ligament at L1-2, L2-3, L3-4 and L4-5. I then used Leksell nodule were to remove the spinous process of L2, L3 and L4.  I began the decompression by using the high speed drill to perform laminotomies at L2, L3 and L4 bilaterally. We then used the Kerrison punches to widen the laminotomy and removed the ligamentum flavum at L2-3, L3-4, and L4-5. We used the Kerrison punches to remove the medial facets at L2-3, L3-4 and L4-5. We performed wide foraminotomies about the bilateral L3, L4 and L5 nerve roots completing the decompression.  We now turned our attention to the posterior lumbar interbody fusion. I used a scalpel to incise the intervertebral disc at L4-5. I then performed a partial intervertebral discectomy at L4-5 using the pituitary forceps. We prepared the vertebral endplates at L4-5 for the fusion by removing the soft tissues with the curettes. We then used the trial spacers to pick the appropriate sized interbody prosthesis. We prefilled his prosthesis with a combination of local morselized autograft bone that we obtained during the decompression as well as Actifuse bone graft extender. We inserted the prefilled prosthesis into the interspace at L4-5. There was a good snug fit of the prosthesis in the interspace. We then filled and the remainder of the intervertebral disc space with local morselized autograft bone and Actifuse. This completed the posterior lumbar interbody  arthrodesis.  We now turned attention to the instrumentation. Under fluoroscopic guidance we cannulated the bilateral L4 and L5 pedicles with the bone probe. We then removed the bone probe. We then tapped the pedicle with a 5.5 millimeter tap. We then removed the tap. We probed inside the tapped pedicle with a ball probe to rule out cortical breaches. We then inserted a 6.5 x 50 millimeter pedicle screw into the L4 and L5 pedicles bilaterally under fluoroscopic guidance. We then palpated along the medial aspect of the pedicles to rule out cortical breaches. There were none. The nerve roots were not injured. We then connected the unilateral pedicle screws with a lordotic rod. We compressed the construct and secured the rod in place with the caps. We then tightened the caps appropriately. This completed the instrumentation from L4-5.  We now turned our attention to the posterior lateral arthrodesis at L4-5. We used the high-speed drill to decorticate the remainder of the facets, pars, transverse process at L4-5. We then applied a combination of local morselized autograft bone and Vitoss bone graft extender over these decorticated posterior lateral structures. This completed the posterior lateral arthrodesis.  We then obtained hemostasis using bipolar electrocautery. We irrigated the wound out with bacitracin solution. We inspected the thecal sac and nerve roots and noted they were well decompressed. We then removed the retractor. We placed a medium Hemovac drain in the epidural space and tunneled out through separate stab wound. We reapproximated patient's thoracolumbar fascia with interrupted #1 Vicryl suture. We reapproximated patient's subcutaneous tissue with interrupted 2-0 Vicryl suture. The reapproximated patient's skin with Steri-Strips and benzoin. The wound was then coated with bacitracin ointment. A sterile dressing was applied. The drapes were removed. The patient was subsequently returned to the  supine position where they were extubated by the anesthesia team. He was then transported to the post anesthesia care unit in stable condition. All sponge instrument and needle counts were reportedly correct at the end of this case.

## 2013-01-08 NOTE — Progress Notes (Signed)
Patient ID: Cynthia Mahoney, female   DOB: [birth year], 78 y.o.   MRN: [id number] Subjective:  The patient is mildly somnolent but easily arousable. She looks well. She is in no apparent distress.  Objective: Vital signs in last 24 hours: Temp:  [97.2 F (36.2 C)-98.8 F (37.1 C)] 98.8 F (37.1 C) (08/27 1542) Pulse Rate:  [66-82] 82 (08/27 1542) Resp:  [16-18] 18 (08/27 1542) BP: (135-141)/(70-78) 135/70 mmHg (08/27 1542) SpO2:  [100 %] 100 % (08/27 1542)  Intake/Output from previous day:   Intake/Output this shift: Total I/O In: 2200 [I.V.:2200] Out: 525 [Urine:325; Blood:200]  Physical exam the patient's, but easily arousable. She is moving her lower extremities well.  Lab Results:  Recent Labs  01/06/13 1418  WBC 8.1  HGB 13.1  HCT 37.6  PLT 324   BMET  Recent Labs  01/06/13 1418  NA 131*  K 4.0  CL 95*  CO2 27  GLUCOSE 89  BUN 14  CREATININE 0.97  CALCIUM 9.9    Studies/Results: Dg Lumbar Spine 2-3 Views  01/08/2013   *RADIOLOGY REPORT*  Clinical Data: Lumbar fusion  DG C-ARM 1-60 MIN,LUMBAR SPINE - 2-3 VIEW  Comparison: 12/24/2012  Findings: Two fluoroscopic spot images.  Bilateral pedicle screws at L4 and L5.  Graft markers project in the L4-5 interspace. Alignment is preserved.  IMPRESSION:  PLIF L4-5.   Original Report Authenticated By: D. Andria Rhein, MD   Dg Lumbar Spine 1 View  01/08/2013   *RADIOLOGY REPORT*  Clinical Data: L4-L5 PLIF.  L2-L5 laminectomy.  LUMBAR SPINE - 1 VIEW  Comparison: Lumbar spine 12/24/2012.  Findings: A single intraoperative cross-table lateral view of the lumbar spine is submitted for evaluation.  This demonstrates the soft tissue retractors in place posterior to L2-L3 and L3-L4.  A single surgical probe is in position with the tip posterior to the L4 pedicle.  IMPRESSION: 1.  Intraoperative localization, as above.   Original Report Authenticated By: Trudie Reed, M.D.   Dg C-arm 1-60 Min  01/08/2013   *RADIOLOGY REPORT*   Clinical Data: Lumbar fusion  DG C-ARM 1-60 MIN,LUMBAR SPINE - 2-3 VIEW  Comparison: 12/24/2012  Findings: Two fluoroscopic spot images.  Bilateral pedicle screws at L4 and L5.  Graft markers project in the L4-5 interspace. Alignment is preserved.  IMPRESSION:  PLIF L4-5.   Original Report Authenticated By: D. Andria Rhein, MD    Assessment/Plan: The patient is doing well.  LOS: 0 days     Cynthia Mahoney D 01/08/2013, 3:54 PM

## 2013-01-08 NOTE — H&P (Signed)
Subjective: Recent is a 78 year old white female who complains of back and leg pain consistent with neurogenic claudication. She has failed medical management and was worked up with a lumbar MRI. The MRI demonstrated multilevel spinal stenosis as well as a spondylolisthesis at L4-5. I discussed the various treatment options with the patient including surgery. She has weighed the risks, benefits, and alternatives surgery decided proceed with a decompressive laminectomy with fusion at an instrumentation L4-5.  Past Medical History  Diagnosis Date  . PONV (postoperative nausea and vomiting)   . Hypertension   . Hypothyroidism   . GERD (gastroesophageal reflux disease)   . Anxiety   . Headache(784.0)     stress  . Arthritis     Past Surgical History  Procedure Laterality Date  . Joint replacement Bilateral 2008, 2013    knee  . Shoulder arthroscopy with open rotator cuff repair Right 04/2012  . Tonsillectomy    . Cardiac catheterization      [facility]    Allergies  Allergen Reactions  . Amoxicillin Rash  . Erythromycin Rash    History  Substance Use Topics  . Smoking status: Never Smoker   . Smokeless tobacco: Never Used  . Alcohol Use: No    History reviewed. No pertinent family history. Prior to Admission medications   Medication Sig Start Date End Date Taking? Authorizing Provider  acetaminophen (TYLENOL) 325 MG tablet Take 650 mg by mouth daily as needed for pain.   Yes Historical Provider, MD  aspirin 81 MG tablet Take 81 mg by mouth daily.   Yes Historical Provider, MD  calcium carbonate (OS-CAL) 600 MG TABS tablet Take 600 mg by mouth daily.   Yes Historical Provider, MD  dorzolamide-timolol (COSOPT) 22.3-6.8 MG/ML ophthalmic solution Place 1 drop into both eyes 2 (two) times daily.   Yes Historical Provider, MD  fentaNYL (DURAGESIC - DOSED MCG/HR) 12 MCG/HR Place 1 patch onto the skin every 3 (three) days.   Yes Historical Provider, MD  gabapentin  (NEURONTIN) 300 MG capsule Take 300-600 mg by mouth 2 (two) times daily. Takes 600 mg in the morning and 300 mg in the evening   Yes Historical Provider, MD  HYDROcodone-acetaminophen (NORCO) 7.5-325 MG per tablet Take 1 tablet by mouth 2 (two) times daily as needed for pain. For pain   Yes Historical Provider, MD  latanoprost (XALATAN) 0.005 % ophthalmic solution Place 1 drop into both eyes at bedtime.   Yes Historical Provider, MD  levothyroxine (SYNTHROID, LEVOTHROID) 88 MCG tablet Take 88 mcg by mouth daily before breakfast.   Yes Historical Provider, MD  LORazepam (ATIVAN) 0.5 MG tablet Take 0.5 mg by mouth daily as needed. For anxiety   Yes Historical Provider, MD  magnesium oxide (MAG-OX) 400 MG tablet Take 400 mg by mouth daily.   Yes Historical Provider, MD  meloxicam (MOBIC) 7.5 MG tablet Take 7.5 mg by mouth 2 (two) times daily.   Yes Historical Provider, MD  metoprolol tartrate (LOPRESSOR) 25 MG tablet Take 25 mg by mouth 2 (two) times daily.   Yes Historical Provider, MD  Multiple Vitamin (MULTIVITAMIN WITH MINERALS) TABS tablet Take 1 tablet by mouth daily.   Yes Historical Provider, MD  Multiple Vitamins-Minerals (VISION FORMULA/LUTEIN PO) Take 1 tablet by mouth daily.   Yes Historical Provider, MD  omeprazole (PRILOSEC) 20 MG capsule Take 20 mg by mouth daily.   Yes Historical Provider, MD  potassium chloride (K-DUR) 10 MEQ tablet Take 10 mEq by mouth daily.  Yes Historical Provider, MD  pravastatin (PRAVACHOL) 40 MG tablet Take 40 mg by mouth at bedtime.    Yes Historical Provider, MD  triamterene-hydrochlorothiazide (DYAZIDE) 37.5-25 MG per capsule Take 1 capsule by mouth every morning.   Yes Historical Provider, MD  zolpidem (AMBIEN) 5 MG tablet Take 5 mg by mouth at bedtime as needed for sleep. For sleep   Yes Historical Provider, MD     Review of Systems  Positive ROS: As above  All other systems have been reviewed and were otherwise negative with the exception of those  mentioned in the HPI and as above.  Objective: Vital signs in last 24 hours: Temp:  [97.2 F (36.2 C)] 97.2 F (36.2 C) (08/27 0743) Pulse Rate:  [66] 66 (08/27 0743) Resp:  [16] 16 (08/27 0743) BP: (141)/(78) 141/78 mmHg (08/27 0743) SpO2:  [100 %] 100 % (08/27 0743)  General Appearance: Alert, cooperative, no distress, appears stated age Head: Normocephalic, without obvious abnormality, atraumatic Eyes: PERRL, conjunctiva/corneas clear, EOM's intact, fundi benign, both eyes      Ears: Normal TM's and external ear canals, both ears Throat: Lips, mucosa, and tongue normal; teeth and gums normal Neck: Supple, symmetrical, trachea midline, no adenopathy; thyroid: No enlargement/tenderness/nodules; no carotid bruit or JVD Back: Symmetric, no curvature, ROM normal, no CVA tenderness Lungs: Clear to auscultation bilaterally, respirations unlabored Heart: Regular rate and rhythm, S1 and S2 normal, no murmur, rub or gallop Abdomen: Soft, non-tender, bowel sounds active all four quadrants, no masses, no organomegaly Extremities: Extremities normal, atraumatic, no cyanosis or edema Pulses: 2+ and symmetric all extremities Skin: Skin color, texture, turgor normal, no rashes or lesions  NEUROLOGIC:   Mental status: alert and oriented, no aphasia, good attention span, Fund of knowledge/ memory ok Motor Exam - grossly normal Sensory Exam - grossly normal Reflexes:  Coordination - grossly normal Gait - grossly normal Balance - grossly normal Cranial Nerves: I: smell Not tested  II: visual acuity  OS: Normal    OD: Normal   II: visual fields Full to confrontation  II: pupils Equal, round, reactive to light  III,VII: ptosis None  III,IV,VI: extraocular muscles  Full ROM  V: mastication Normal  V: facial light touch sensation  Normal  V,VII: corneal reflex  Present  VII: facial muscle function - upper  Normal  VII: facial muscle function - lower Normal  VIII: hearing Not tested  IX:  soft palate elevation  Normal  IX,X: gag reflex Present  XI: trapezius strength  5/5  XI: sternocleidomastoid strength 5/5  XI: neck flexion strength  5/5  XII: tongue strength  Normal    Data Review Lab Results  Component Value Date   WBC 8.1 01/06/2013   HGB 13.1 01/06/2013   HCT 37.6 01/06/2013   MCV 91.5 01/06/2013   PLT 324 01/06/2013   Lab Results  Component Value Date   NA 131* 01/06/2013   K 4.0 01/06/2013   CL 95* 01/06/2013   CO2 27 01/06/2013   BUN 14 01/06/2013   CREATININE 0.97 01/06/2013   GLUCOSE 89 01/06/2013   No results found for this basename: INR, PROTIME    Assessment/Plan: L2-3, L3-4 and L4-5 spinal stenosis, L4-5 spinal listhesis lumbago lumbar radiculopathy neurogenic claudication: I discussed the situation with the patient. I reviewed her MR scan with her and pointed out the abnormalities. We have discussed the various treatment options including surgery. I described the surgical treatment option of a decompressive laminectomy with a agitation and fusion at  L4-5. I've shown her surgical models. We have discussed the risks, benefits, alternatives, and likelihood of achieving our goals with surgery. I've answered all the patient's questions. She has decided to proceed with surgery.   Katy Brickell D 01/08/2013 10:35 AM

## 2013-01-08 NOTE — Preoperative (Signed)
Beta Blockers   Reason not to administer Beta Blockers:Not Applicable 

## 2013-01-08 NOTE — Progress Notes (Signed)
The patient gets a rash with amoxicillin...Marland KitchenMarland Kitchenthere was an order for Ancef 2gm, but it looks like it was d/c.  No new order for antibiotic noted....  DA

## 2013-01-08 NOTE — Progress Notes (Signed)
ANTIBIOTIC CONSULT NOTE - INITIAL  Pharmacy Consult for Vancomycin  Indication: post-op prophlaxis, pt with active drain  Allergies  Allergen Reactions  . Amoxicillin Rash  . Erythromycin Rash   Wt: 77.2 kg  Vital Signs: Temp: 96.8 F (36 C) (08/27 1709) BP: 133/78 mmHg (08/27 1702) Pulse Rate: 73 (08/27 1702) Intake/Output from previous day:   Intake/Output from this shift:  Total I/O In: 2200 [I.V.:2200] Out: 525 [Urine:325; Blood:200]  Labs:  Recent Labs  01/06/13 1418  WBC 8.1  HGB 13.1  PLT 324  CREATININE 0.97   The CrCl is unknown because both a height and weight (above a minimum accepted value) are required for this calculation. No results found for this basename: VANCOTROUGH, Leodis Binet, VANCORANDOM, GENTTROUGH, GENTPEAK, GENTRANDOM, TOBRATROUGH, TOBRAPEAK, TOBRARND, AMIKACINPEAK, AMIKACINTROU, AMIKACIN,  in the last 72 hours   Microbiology: Recent Results (from the past 720 hour(s))  SURGICAL PCR SCREEN     Status: Abnormal   Collection Time    01/06/13  2:18 PM      Result Value Range Status   MRSA, PCR POSITIVE (*) NEGATIVE Final   Staphylococcus aureus POSITIVE (*) NEGATIVE Final   Comment:            The Xpert SA Assay (FDA     approved for NASAL specimens     in patients over 79 years of age),     is one component of     a comprehensive surveillance     program.  Test performance has     been validated by [employer] for patients greater     than or equal to 66 year old.     It is not intended     to diagnose infection nor to     guide or monitor treatment.    Medical History: Past Medical History  Diagnosis Date  . PONV (postoperative nausea and vomiting)   . Hypertension   . Hypothyroidism   . GERD (gastroesophageal reflux disease)   . Anxiety   . Headache(784.0)     stress  . Arthritis     Assessment: 78 y/o F post-op from spinal surgery to get vancomycin for surgical prophylaxis. Pt does have drain in place so vancomycin  will be continued until DC'd by the MD. WBC 8.1, Scr 0.97 with CrCl ~ 50-60. Afebrile.   Received Vancomycin 1000 mg IV x 1 at 1114 today  Goal of Therapy:  Vancomycin trough level 15-20 mcg/ml Prevent post-op infection  Plan:  -Vancomycin 750 mg IV q12h starting tonight at 2300 -Continue until MD stops/drain is removed -Trend WBC, temp, renal function  Thank you for allowing me to take part in this patient's care,  Abran Duke, PharmD Clinical Pharmacist Phone: [phone] Pager: [phone] 01/08/2013 6:38 PM

## 2013-01-08 NOTE — Anesthesia Postprocedure Evaluation (Signed)
  Anesthesia Post Note  Patient: Cynthia Mahoney  Procedure(s) Performed: Procedure(s) (LRB): Lumbar two-three, Lumbar three-four, Lumbar four-five laminectomies with Lumbar four-five posterior lumbar interbody fusion with interbody prothesis posterior lateral arthrodesis and posterior nonsegmental instrumentation (N/A)  Anesthesia type: GA  Patient location: PACU  Post pain: Pain level controlled  Post assessment: Post-op Vital signs reviewed  Last Vitals:  Filed Vitals:   01/08/13 1542  BP: 135/70  Pulse: 82  Temp: 37.1 C  Resp: 18    Post vital signs: Reviewed  Level of consciousness: sedated  Complications: No apparent anesthesia complications

## 2013-01-08 NOTE — Anesthesia Preprocedure Evaluation (Addendum)
Anesthesia Evaluation  Patient identified by MRN, date of birth, ID band Patient awake    Reviewed: Allergy & Precautions, H&P , NPO status , Patient's Chart, lab work & pertinent test results, reviewed documented beta blocker date and time   History of Anesthesia Complications (+) PONV  Airway Mallampati: II      Dental   Pulmonary neg pulmonary ROS,  breath sounds clear to auscultation        Cardiovascular hypertension, Pt. on medications and Pt. on home beta blockers Rhythm:Regular Rate:Normal     Neuro/Psych  Headaches, Anxiety    GI/Hepatic GERD-  ,  Endo/Other  Hypothyroidism   Renal/GU negative Renal ROS     Musculoskeletal   Abdominal   Peds  Hematology   Anesthesia Other Findings   Reproductive/Obstetrics                         Anesthesia Physical Anesthesia Plan  ASA: III  Anesthesia Plan: General   Post-op Pain Management:    Induction: Intravenous  Airway Management Planned: Oral ETT  Additional Equipment:   Intra-op Plan:   Post-operative Plan: Extubation in OR  Informed Consent: I have reviewed the patients History and Physical, chart, labs and discussed the procedure including the risks, benefits and alternatives for the proposed anesthesia with the patient or authorized representative who has indicated his/her understanding and acceptance.   Dental advisory given  Plan Discussed with: CRNA, Anesthesiologist and Surgeon  Anesthesia Plan Comments:         Anesthesia Quick Evaluation

## 2013-01-09 LAB — CBC
HCT: 27.3 % — ABNORMAL LOW (ref 36.0–46.0)
Hemoglobin: 9.5 g/dL — ABNORMAL LOW (ref 12.0–15.0)
MCV: 91 fL (ref 78.0–100.0)
RDW: 13 % (ref 11.5–15.5)
WBC: 8.6 10*3/uL (ref 4.0–10.5)

## 2013-01-09 LAB — BASIC METABOLIC PANEL
BUN: 16 mg/dL (ref 6–23)
Chloride: 97 mEq/L (ref 96–112)
Creatinine, Ser: 1.02 mg/dL (ref 0.50–1.10)
Glucose, Bld: 149 mg/dL — ABNORMAL HIGH (ref 70–99)
Potassium: 4.1 mEq/L (ref 3.5–5.1)

## 2013-01-09 MED ORDER — MUPIROCIN 2 % EX OINT
1.0000 "application " | TOPICAL_OINTMENT | Freq: Two times a day (BID) | CUTANEOUS | Status: AC
  Administered 2013-01-09 – 2013-01-13 (×10): 1 via NASAL
  Filled 2013-01-09: qty 22

## 2013-01-09 MED ORDER — CHLORHEXIDINE GLUCONATE CLOTH 2 % EX PADS
6.0000 | MEDICATED_PAD | Freq: Every day | CUTANEOUS | Status: AC
  Administered 2013-01-09 – 2013-01-13 (×3): 6 via TOPICAL

## 2013-01-09 MED ORDER — MORPHINE SULFATE 2 MG/ML IJ SOLN: 2.0000 mg | INTRAMUSCULAR | Status: DC | PRN

## 2013-01-09 MED FILL — Heparin Sodium (Porcine) Inj 1000 Unit/ML: INTRAMUSCULAR | Qty: 30 | Status: AC

## 2013-01-09 MED FILL — Sodium Chloride IV Soln 0.9%: INTRAVENOUS | Qty: 1000 | Status: AC

## 2013-01-09 NOTE — Progress Notes (Signed)
Orthopedic Tech Progress Note Patient Details:  Cynthia Mahoney 11-08-1934 [id number]  Patient ID: Cynthia Mahoney, female   DOB: [birth year], 78 y.o.   MRN: [id number]   Cynthia Mahoney 01/09/2013, 9:59 AMLumbar corset completed by bio-tech.

## 2013-01-09 NOTE — Evaluation (Signed)
Physical Therapy Evaluation Patient Details Name: Cynthia Mahoney MRN: [id number] DOB: 16-May-1934 Today's Date: 01/09/2013 Time: 7829-5621 PT Time Calculation (min): 33 min  PT Assessment / Plan / Recommendation History of Present Illness  Lumbar two-three, Lumbar three-four, Lumbar four-five laminectomies with Lumbar four-five posterior lumbar interbody fusion   Clinical Impression  Patient is s/p surgery resulting in the deficits listed below (see PT Problem List). Patient will benefit from skilled PT to increase their independence and safety with mobility (while adhering to their precautions) to allow discharge to SNF.       PT Assessment  Patient needs continued PT services    Follow Up Recommendations  SNF    Does the patient have the potential to tolerate intense rehabilitation      Barriers to Discharge Decreased caregiver support      Equipment Recommendations  None recommended by PT    Recommendations for Other Services     Frequency Min 5X/week    Precautions / Restrictions Precautions Precautions: Fall;Back Lumbar corset brace  Restrictions Weight Bearing Restrictions: No   Pertinent Vitals/Pain Initially 4/10 prior to mobility, this increased to 10/10; she had been premedicated with morphine and PCA button, pain decreased when we donned the brace      Mobility  Bed Mobility Bed Mobility: Rolling Left;Left Sidelying to Sit Rolling Left: 4: Min guard;With rail Left Sidelying to Sit: 4: Min guard;HOB flat;With rails Details for Bed Mobility Assistance: cues for sequencing Transfers Transfers: Sit to Stand;Stand to Sit Sit to Stand: 4: Min assist;With upper extremity assist;From bed Stand to Sit: 4: Min assist;With upper extremity assist;To chair/3-in-1;With armrests Details for Transfer Assistance: cues for technique and body positioning as well as hand placement, minA for follow through Ambulation/Gait Ambulation/Gait Assistance: 4: Min guard;4: Min  assist Ambulation Distance (Feet): 110 Feet Assistive device: Rolling walker Ambulation/Gait Assistance Details: cues for tall posture and safe proximity to RW Gait Pattern: Step-through pattern;Decreased stride length Gait velocity: decr Stairs: No    Exercises     PT Diagnosis: Difficulty walking;Generalized weakness;Acute pain  PT Problem List: Decreased strength;Decreased activity tolerance;Decreased mobility;Pain;Decreased knowledge of precautions PT Treatment Interventions: DME instruction;Gait training;Functional mobility training;Therapeutic activities;Therapeutic exercise;Patient/family education     PT Goals(Current goals can be found in the care plan section) Acute Rehab PT Goals Patient Stated Goal: stronger, independent, no pain PT Goal Formulation: With patient Time For Goal Achievement: 01/16/13 Potential to Achieve Goals: Good  Visit Information  Last PT Received On: 01/09/13 Assistance Needed: +1 History of Present Illness: Lumbar two-three, Lumbar three-four, Lumbar four-five laminectomies with Lumbar four-five posterior lumbar interbody fusion        Prior Functioning  Home Living Family/patient expects to be discharged to:: Skilled nursing facility Living Arrangements: Alone Home Equipment: Dan Humphreys - 2 wheels;Cane - single point Prior Function Level of Independence: Independent Communication Communication: No difficulties    Cognition  Cognition Arousal/Alertness: Awake/alert Behavior During Therapy: WFL for tasks assessed/performed Overall Cognitive Status: Within Functional Limits for tasks assessed    Extremity/Trunk Assessment Upper Extremity Assessment Upper Extremity Assessment: Defer to OT evaluation Lower Extremity Assessment Lower Extremity Assessment: Generalized weakness   Balance    End of Session PT - End of Session Equipment Utilized During Treatment: Gait belt Activity Tolerance: Patient tolerated treatment well Patient left: in  chair;with call bell/phone within reach Nurse Communication: Mobility status  GP     Ludger Nutting 01/09/2013, 9:38 AM

## 2013-01-09 NOTE — Progress Notes (Signed)
Orthopedic Tech Progress Note Patient Details:  Cynthia Mahoney Sep 28, 1934 [id number]  Patient ID: [name], female   DOB: August 18, 1934, 78 y.o.   MRN: [id number]   Shawnie Pons 01/09/2013, 9:51 [facility] bio-tech for lumbar corset.

## 2013-01-09 NOTE — Evaluation (Signed)
Occupational Therapy Evaluation Patient Details Name: Cynthia Mahoney MRN: [id number] DOB: 12-Dec-1934 Today's Date: 01/09/2013 Time: 0912-0933 OT Time Calculation (min): 21 min  OT Assessment / Plan / Recommendation History of present illness 78 yo female s/p L2-5 laminectomies with L4-5 fusion currently with Aspen corset back brace   Clinical Impression   Pt with difficulty donning brace due to tight fit. Pt reports "it fit before surgery" Pt currently plans to d/c SNF due to lack of caregivers at d/c. Pt progressing well and limited by activity tolerance.    OT Assessment  Patient needs continued OT Services    Follow Up Recommendations  SNF ([facility])    Barriers to Discharge Decreased caregiver support    Equipment Recommendations  3 in 1 bedside comode    Recommendations for Other Services    Frequency  Min 2X/week    Precautions / Restrictions Precautions Precautions: Fall;Back Precaution Comments: back hand out provided in room Required Braces or Orthoses: Spinal Brace Spinal Brace: Lumbar corset;Applied in sitting position Restrictions Weight Bearing Restrictions: No   Pertinent Vitals/Pain Discomfort reported but minimal pain    ADL  Eating/Feeding: Modified independent Where Assessed - Eating/Feeding: Chair Grooming: Wash/dry hands;Teeth care;Wash/dry face;Supervision/safety Where Assessed - Grooming: Supported sitting Upper Body Dressing: Maximal assistance (don brace) Where Assessed - Upper Body Dressing: Unsupported sitting Lower Body Dressing: +1 Total assistance Where Assessed - Lower Body Dressing: Supported sit to Pharmacist, hospital: Minimal assistance Toilet Transfer Method: Sit to Barista: Regular height toilet Toileting - Clothing Manipulation and Hygiene: Minimal assistance Where Assessed - Engineer, mining and Hygiene: Sit to stand from 3-in-1 or toilet Equipment Used: Gait belt;Back brace;Rolling  walker Transfers/Ambulation Related to ADLs: Pt completed transfer with RW and verbal cues for hand placement . pt with kyphotic posture and cues for upright posture ADL Comments: pt completed bed mobilty and needed (A) to don brace brace. pt with very tight fitting brace at this time. Due to fit pt needed higher level of (A). Brace is  unable to be expanded. Pt reports brace feelinig good once applied and not too tight. Pt completed oral care in chair due to fatigue s/p short ambulation with RW education . Pt educated on back precautions throughout session    OT Diagnosis: Generalized weakness;Acute pain  OT Problem List: Decreased strength;Decreased activity tolerance;Impaired balance (sitting and/or standing);Decreased safety awareness;Decreased knowledge of use of DME or AE;Decreased knowledge of precautions;Pain OT Treatment Interventions: Self-care/ADL training;Therapeutic exercise;DME and/or AE instruction;Therapeutic activities;Patient/family education;Balance training   OT Goals(Current goals can be found in the care plan section) Acute Rehab OT Goals Patient Stated Goal: stronger, independent, no pain OT Goal Formulation: With patient Time For Goal Achievement: 01/23/13 Potential to Achieve Goals: Good ADL Goals Pt Will Perform Grooming: with modified independence;standing Pt Will Perform Upper Body Bathing: with modified independence;standing Pt Will Perform Lower Body Bathing: with modified independence;sit to/from stand;with adaptive equipment Pt Will Transfer to Toilet: with modified independence;regular height toilet  Visit Information  Last OT Received On: 01/09/13 Assistance Needed: +1 PT/OT Co-Evaluation/Treatment: Yes History of Present Illness: 78 yo female s/p L2-5 laminectomies with L4-5 fusion currently with Aspen corset back brace       Prior Functioning     Home Living Family/patient expects to be discharged to:: Skilled nursing facility Living Arrangements:  Alone Home Equipment: Walker - 2 wheels;Cane - single point Prior Function Level of Independence: Independent Communication Communication: No difficulties  Vision/Perception Vision - History Baseline Vision: Wears glasses all the time Patient Visual Report: No change from baseline   Cognition  Cognition Arousal/Alertness: Awake/alert Behavior During Therapy: WFL for tasks assessed/performed Overall Cognitive Status: Within Functional Limits for tasks assessed    Extremity/Trunk Assessment Upper Extremity Assessment Upper Extremity Assessment: Overall WFL for tasks assessed Lower Extremity Assessment Lower Extremity Assessment: Defer to PT evaluation Cervical / Trunk Assessment Cervical / Trunk Assessment: Kyphotic     Mobility Bed Mobility Bed Mobility: Rolling Left;Left Sidelying to Sit Rolling Left: 4: Min guard;With rail Left Sidelying to Sit: 4: Min guard;HOB flat;With rails Details for Bed Mobility Assistance: cues for sequencing Transfers Sit to Stand: 4: Min assist;With upper extremity assist;From bed Stand to Sit: 4: Min assist;With upper extremity assist;To chair/3-in-1;With armrests Details for Transfer Assistance: cues for technique and body positioning as well as hand placement, minA for follow through     Exercise     Balance     End of Session OT - End of Session Activity Tolerance: Patient tolerated treatment well Patient left: in chair;with call bell/phone within reach Nurse Communication: Mobility status;Precautions  GO     Harolyn Rutherford 01/09/2013, 10:36 AM Pager: [phone]

## 2013-01-09 NOTE — Progress Notes (Signed)
Patient ID: Cynthia Mahoney, female   DOB: 1935/02/27, 78 y.o.   MRN: [id number] Subjective:  The patient is alert and pleasant. She looks well. Her back is appropriately sore.  Objective: Vital signs in last 24 hours: Temp:  [96.8 F (36 C)-98.8 F (37.1 C)] 98.6 F (37 C) (08/28 0541) Pulse Rate:  [67-114] 98 (08/28 0541) Resp:  [14-36] 19 (08/28 0823) BP: (100-136)/(44-92) 100/47 mmHg (08/28 0541) SpO2:  [99 %-100 %] 99 % (08/28 0823) FiO2 (%):  [67 %] 67 % (08/28 0823) Weight:  [78.79 kg (173 lb 11.2 oz)] 78.79 kg (173 lb 11.2 oz) (08/27 2100)  Intake/Output from previous day: 08/27 0701 - 08/28 0700 In: 2200 [I.V.:2200] Out: 1165 [Urine:875; Drains:90; Blood:200] Intake/Output this shift:    Physical exam the patient is alert and oriented. Her lower extremity strength is grossly normal. Her dressing is clean and dry.  Lab Results:  Recent Labs  01/06/13 1418 01/09/13 0712  WBC 8.1 8.6  HGB 13.1 9.5*  HCT 37.6 27.3*  PLT 324 256   BMET  Recent Labs  01/06/13 1418 01/09/13 0712  NA 131* 133*  K 4.0 4.1  CL 95* 97  CO2 27 28  GLUCOSE 89 149*  BUN 14 16  CREATININE 0.97 1.02  CALCIUM 9.9 8.9    Studies/Results: Dg Lumbar Spine 2-3 Views  01/08/2013   *RADIOLOGY REPORT*  Clinical Data: Lumbar fusion  DG C-ARM 1-60 MIN,LUMBAR SPINE - 2-3 VIEW  Comparison: 12/24/2012  Findings: Two fluoroscopic spot images.  Bilateral pedicle screws at L4 and L5.  Graft markers project in the L4-5 interspace. Alignment is preserved.  IMPRESSION:  PLIF L4-5.   Original Report Authenticated By: Mahoney. Andria Rhein, MD   Dg Lumbar Spine 1 View  01/08/2013   *RADIOLOGY REPORT*  Clinical Data: L4-L5 PLIF.  L2-L5 laminectomy.  LUMBAR SPINE - 1 VIEW  Comparison: Lumbar spine 12/24/2012.  Findings: A single intraoperative cross-table lateral view of the lumbar spine is submitted for evaluation.  This demonstrates the soft tissue retractors in place posterior to L2-L3 and L3-L4.  A single  surgical probe is in position with the tip posterior to the L4 pedicle.  IMPRESSION: 1.  Intraoperative localization, as above.   Original Report Authenticated By: Trudie Reed, M.Mahoney.   Dg C-arm 1-60 Min  01/08/2013   *RADIOLOGY REPORT*  Clinical Data: Lumbar fusion  DG C-ARM 1-60 MIN,LUMBAR SPINE - 2-3 VIEW  Comparison: 12/24/2012  Findings: Two fluoroscopic spot images.  Bilateral pedicle screws at L4 and L5.  Graft markers project in the L4-5 interspace. Alignment is preserved.  IMPRESSION:  PLIF L4-5.   Original Report Authenticated By: Mahoney. Andria Rhein, MD    Assessment/Plan: Postop day #1: The patient is doing well. We will mobilize her with PT and OT. I'll discontinue the PCA pump and Foley catheter. We'll plan to remove the Hemovac tomorrow.  LOS: 1 day     Cynthia Mahoney 01/09/2013, 9:16 AM

## 2013-01-10 MED ORDER — OXYCODONE-ACETAMINOPHEN 10-325 MG PO TABS: 1.0000 | ORAL_TABLET | ORAL | Status: DC | PRN

## 2013-01-10 MED ORDER — DSS 100 MG PO CAPS: 100.0000 mg | ORAL_CAPSULE | Freq: Two times a day (BID) | ORAL | Status: DC

## 2013-01-10 MED ORDER — DIAZEPAM 5 MG PO TABS: 5.0000 mg | ORAL_TABLET | Freq: Four times a day (QID) | ORAL | Status: DC | PRN

## 2013-01-10 NOTE — Progress Notes (Signed)
Occupational Therapy Treatment Patient Details Name: Cynthia Mahoney MRN: [id number] DOB: 01/04/1935 Today's Date: 01/10/2013 Time: 4098-1191 OT Time Calculation (min): 35 min  OT Assessment / Plan / Recommendation  History of present illness 78 yo female s/p L2-5 laminectomies with L4-5 fusion currently with Aspen corset back brace   OT comments  Pt now hoping to return home with intermittent assistance.  Worked with pt on use of adaptive equipment to complete LB ADLs.  Pt demonstratesbsignificant difficulties with new learning.  She required max, step by step verbal instruction to don socks safely, and was unable to demonstrate safe technique, or use of AE even after just being shown/instructed.  Pt required five repetitions of practice donning socks to progress to needing min verbal cues.  Was not able to instruct her on other ADL activities due to amount of time and repetition this one task consumed.  She will likely continue to need SNF level rehab, although she is still hoping to discharge home at beginning of week.  She will need practice, reinforcement to be able to generalize info from hospital to home.   She is unsafe to discharge home at this time, with anything less than 24 hour assistance.   Follow Up Recommendations  SNF;Supervision/Assistance - 24 hour    Barriers to Discharge       Equipment Recommendations  3 in 1 bedside comode    Recommendations for Other Services    Frequency Min 2X/week   Progress towards OT Goals Progress towards OT goals: Progressing toward goals  Plan Discharge plan remains appropriate    Precautions / Restrictions Precautions Precautions: Fall;Back Precaution Comments: back hand out provided in room Required Braces or Orthoses: Spinal Brace Spinal Brace: Lumbar corset;Applied in sitting position Restrictions Weight Bearing Restrictions: No   Pertinent Vitals/Pain     ADL  ADL Comments: Pt now hoping to discharge home over weekend with  family's intermittent assist.  Pt able to recall 3/3 back precautions and demonstrates good awareness of precautions.  Pt. instructed in use of sock aid; however, was unable retain how to use it.  Required 5 trials of repetition donning/doffing socks with reacher and sock aid.  Initially pt required step by step cues (max verbal cues and instruction), progressing to min.  Pt making same errors repetetively, and unable to problem solve through options, nor recall what instruction she had just received from therapist.  Long discussion with she and sister that I do not feel she is able to go home at this time without 24 hour assistance, due to need for extensive practice with ADLs in order for her to retain information taught.   She does not have this level of assistance and agrees that she may likely need SNF.     OT Diagnosis:    OT Problem List:   OT Treatment Interventions:     OT Goals(current goals can now be found in the care plan section) ADL Goals Pt Will Perform Grooming: with modified independence;standing Pt Will Perform Upper Body Bathing: with modified independence;standing Pt Will Perform Lower Body Bathing: with modified independence;sit to/from stand;with adaptive equipment Pt Will Transfer to Toilet: with modified independence;regular height toilet  Visit Information  Last OT Received On: 01/10/13 Assistance Needed: +1 History of Present Illness: 78 yo female s/p L2-5 laminectomies with L4-5 fusion currently with Aspen corset back brace    Subjective Data      Prior Functioning       Cognition  Cognition Arousal/Alertness: Awake/alert Behavior  During Therapy: WFL for tasks assessed/performed Overall Cognitive Status: Impaired/Different from baseline Area of Impairment: Memory;Problem solving Memory: Decreased short-term memory Problem Solving: Difficulty sequencing;Requires verbal cues General Comments: Pt demonstrates significant difficulty learning new information -  requires max verbal cuing and significant amount of repetition to ensure carry over.     Mobility  Bed Mobility Bed Mobility: Not assessed Transfers Sit to Stand: 4: Min assist;With upper extremity assist;From chair/3-in-1;With armrests Stand to Sit: To chair/3-in-1;With upper extremity assist Details for Transfer Assistance: cues for technique and body positioning as well as hand placement, minA for follow through    Exercises      Balance     End of Session OT - End of Session Equipment Utilized During Treatment: Other (comment);Back brace (AE) Activity Tolerance: Patient tolerated treatment well Patient left: in chair;with call bell/phone within reach;with family/visitor present  GO     Monica Zahler, Ursula Alert M 01/10/2013, 5:40 PM

## 2013-01-10 NOTE — Progress Notes (Signed)
Physical Therapy Treatment Patient Details Name: Cynthia Mahoney MRN: [id number] DOB: 1934/11/17 Today's Date: 01/10/2013 Time: 1191-4782 PT Time Calculation (min): 31 min  PT Assessment / Plan / Recommendation  History of Present Illness 78 yo female s/p L2-5 laminectomies with L4-5 fusion currently with Aspen corset back brace   PT Comments   Progressing nicely today. Limited initially by incontinence needing assist to sit on 3in1 and with hygiene.   Follow Up Recommendations  SNF     Does the patient have the potential to tolerate intense rehabilitation     Barriers to Discharge        Equipment Recommendations  None recommended by PT    Recommendations for Other Services    Frequency Min 5X/week   Progress towards PT Goals Progress towards PT goals: Progressing toward goals  Plan Current plan remains appropriate    Precautions / Restrictions Precautions Precautions: Fall;Back Precaution Comments: back hand out provided in room Required Braces or Orthoses: Spinal Brace Spinal Brace: Lumbar corset;Applied in sitting position   Pertinent Vitals/Pain Reports minimal soreness    Mobility  Bed Mobility Bed Mobility: Not assessed Transfers Transfers: Sit to Stand;Stand to Sit Sit to Stand: 4: Min assist;With upper extremity assist;From chair/3-in-1;With armrests Stand to Sit: To chair/3-in-1;With upper extremity assist Details for Transfer Assistance: cues for technique and body positioning as well as hand placement, minA for follow through Ambulation/Gait Ambulation/Gait Assistance: 4: Min guard Ambulation Distance (Feet): 600 Feet Assistive device: Rolling walker Ambulation/Gait Assistance Details: cues for tall posture and no twisting when turning with RW Gait Pattern: Step-through pattern;Decreased stride length Gait velocity: decr      PT Goals (current goals can now be found in the care plan section)    Visit Information  Last PT Received On:  01/10/13 Assistance Needed: +1 History of Present Illness: 78 yo female s/p L2-5 laminectomies with L4-5 fusion currently with Aspen corset back brace    Subjective Data      Cognition  Cognition Arousal/Alertness: Awake/alert Behavior During Therapy: WFL for tasks assessed/performed Overall Cognitive Status: Within Functional Limits for tasks assessed    Balance     End of Session PT - End of Session Equipment Utilized During Treatment: Gait belt Activity Tolerance: Patient tolerated treatment well Patient left: in chair;with call bell/phone within reach;with family/visitor present Nurse Communication: Mobility status   GP     Ludger Nutting 01/10/2013, 3:38 PM

## 2013-01-10 NOTE — Plan of Care (Signed)
Dr Lovell Sheehan called in regards to iv vancomycin. Pt has no iv and refuses an iv. Can iv med be d/c or switch to oral. Waiting for md to call back.

## 2013-01-10 NOTE — Clinical Social Work Placement (Signed)
Clinical Social Work Department CLINICAL SOCIAL WORK PLACEMENT NOTE 01/10/2013  Patient:  KELTIE, LABELL  Account Number:  [id number] Admit date:  01/08/2013  Clinical Social Worker:  Macario Golds, LCSW  Date/time:  01/10/2013 03:00 PM  Clinical Social Work is seeking post-discharge placement for this patient at the following level of care:   SKILLED NURSING   (*CSW will update this form in Epic as items are completed)   01/10/2013  Patient/family provided with [facility] Department of Clinical Social Work's list of facilities offering this level of care within the geographic area requested by the patient (or if unable, by the patient's family).  01/10/2013  Patient/family informed of their freedom to choose among providers that offer the needed level of care, that participate in Medicare, Medicaid or managed care program needed by the patient, have an available bed and are willing to accept the patient.  01/10/2013  Patient/family informed of [facility]' ownership interest in [facility], as well as of the fact that they are under no obligation to receive care at this facility.  PASARR submitted to EDS on 01/10/2013 PASARR number received from [employer] on 01/10/2013  FL2 transmitted to all facilities in geographic area requested by pt/family on  01/10/2013 FL2 transmitted to all facilities within larger geographic area on   Patient informed that his/her managed care company has contracts with or will negotiate with  certain facilities, including the following:     Patient/family informed of bed offers received:  01/10/2013 Patient chooses bed at [facility] Physician recommends and patient chooses bed at    Patient to be transferred to  on   Patient to be transferred to facility by   The following physician request were entered in Epic:   Additional Comments: 08/29 - Patient accepted to [facility] for Tuesday pending insurance authorization

## 2013-01-10 NOTE — Clinical Social Work Note (Signed)
Clinical Social Work Department BRIEF PSYCHOSOCIAL ASSESSMENT 01/10/2013  Patient:  Cynthia Mahoney, Cynthia Mahoney     Account Number:  [id number]     Admit date:  01/08/2013  Clinical Social Worker:  Verl Blalock  Date/Time:  01/10/2013 04:00 PM  Referred by:  Physician  Date Referred:  01/09/2013 Referred for  SNF Placement   Other Referral:   Interview type:  Patient Other interview type:   Patient family at bedside    PSYCHOSOCIAL DATA Living Status:  ALONE Admitted from facility:   Level of care:   Primary support name:  Vanderschaaf,James  [phone] Primary support relationship to patient:  CHILD, ADULT Degree of support available:   Strong    CURRENT CONCERNS Current Concerns  Post-Acute Placement   Other Concerns:    SOCIAL WORK ASSESSMENT / PLAN Clinical Social Worker met with patient and family member at bedside to offer support and discuss patient plans at discharge.  Patient states that she lives at home alone and feels that she will only need a few days at [facility] but does not feel certain she is steady enough to return home alone. Patient family can assist over the weekend, however they are working during the week.  Patient has been to [facility] twice in the past and would like to return for a short stay if possible.  CSW initiated referral and spoke with facility regarding patient wishes.  [facility] has extended a bed offer.    Patient with [facility], in which authorization is needed prior to admission.  CSW has submitted all clinical information, however no return phone call today and the office is closed for the holiday weekend.  CSW has made patient aware of the possibility of a discharge as late as Tuesday if SNF remains the plan.  PT plans to visit with patient again tomorrow to see improvements.  Patient states that if she can return with [facility] she would like to use [place] and has all her equipment already at home - CM aware.  CSW will remain available for support and  to facilitate patient discharge needs once insurance authorization is obtained.   Assessment/plan status:  Psychosocial Support/Ongoing Assessment of Needs Other assessment/ plan:   Information/referral to community resources:   Clinical Social Worker offered patient additional options for discharge, however patient is aware of bed offer and option to return home - will notify staff on Saturday.    PATIENT'S/FAMILY'S RESPONSE TO PLAN OF CARE: Patient alert and oriented x3 sitting up in the chair. Patient with good family support and the motivation to get home as soon as possible.  Patient is understanding of social work role and is hopeful that she will progress enough over the weekend to return home.  Patient and her family appreciative of CSW support and concern.

## 2013-01-10 NOTE — Discharge Summary (Signed)
Physician Discharge Summary  Patient ID: Cynthia Mahoney MRN: [id number] DOB/AGE: 76-Apr-1936 76 y.o.  Admit date: 01/08/2013 Discharge date: 01/10/2013  Admission Diagnoses: L2-3, L3-4 and L4-5 spinal stenosis, lumbago, lumbar radiculopathy, neurogenic claudication, L4-5 spondylolisthesis  Discharge Diagnoses: The same Active Problems:   * No active hospital problems. *   Discharged Condition: good  Hospital Course: I performed at L2-3, L3-4 and L4-5 decompressive laminectomy with the L4-5 instrumentation and fusion on the patient on 01/08/2013. The surgery went well.  The patient's postoperative course was unremarkable. We had PT and OT to mobilize the patient. The patient requested transfer to [facility] facility. Arrangements were made. The patient was given oral and written discharge instructions. All her questions were answered.  Consults: PT, OT Significant Diagnostic Studies: None Treatments: L2-3, L3-4 and L4-5 decompressive laminectomy, L4-5 instrumentation and fusion. Discharge Exam: Blood pressure 107/55, pulse 84, temperature 98.4 F (36.9 C), temperature source Oral, resp. rate 20, height 5\' 3"  (1.6 m), weight 78.79 kg (173 lb 11.2 oz), SpO2 99.00%. Patient is alert and pleasant. Her strength is grossly normal in her lower extremity. She looks well.  Disposition: [facility]  Discharge Orders   Future Orders Complete By Expires   Call MD for:  difficulty breathing, headache or visual disturbances  As directed    Call MD for:  extreme fatigue  As directed    Call MD for:  hives  As directed    Call MD for:  persistant dizziness or light-headedness  As directed    Call MD for:  persistant nausea and vomiting  As directed    Call MD for:  redness, tenderness, or signs of infection (pain, swelling, redness, odor or green/yellow discharge around incision site)  As directed    Call MD for:  severe uncontrolled pain  As directed    Call MD for:   temperature >100.4  As directed    Diet - low sodium heart healthy  As directed    Discharge instructions  As directed    Comments:     Call [phone] for a followup appointment. Take a stool softener while you are using pain medications.   Driving Restrictions  As directed    Comments:     Do not drive for 2 weeks.   Increase activity slowly  As directed    Lifting restrictions  As directed    Comments:     Do not lift more than 5 pounds. No excessive bending or twisting.   May shower / Bathe  As directed    Comments:     He may shower after the pain she is removed 3 days after surgery. Leave the incision alone.   Remove dressing in 24 hours  As directed        Medication List    STOP taking these medications       acetaminophen 325 MG tablet  Commonly known as:  TYLENOL     fentaNYL 12 MCG/HR  Commonly known as:  DURAGESIC - dosed mcg/hr     HYDROcodone-acetaminophen 7.5-325 MG per tablet  Commonly known as:  NORCO     LORazepam 0.5 MG tablet  Commonly known as:  ATIVAN     meloxicam 7.5 MG tablet  Commonly known as:  MOBIC      TAKE these medications       aspirin 81 MG tablet  Take 81 mg by mouth daily.     calcium carbonate 600 MG Tabs tablet  Commonly known  as:  OS-CAL  Take 600 mg by mouth daily.     diazepam 5 MG tablet  Commonly known as:  VALIUM  Take 1 tablet (5 mg total) by mouth every 6 (six) hours as needed.     dorzolamide-timolol 22.3-6.8 MG/ML ophthalmic solution  Commonly known as:  COSOPT  Place 1 drop into both eyes 2 (two) times daily.     DSS 100 MG Caps  Take 100 mg by mouth 2 (two) times daily.     gabapentin 300 MG capsule  Commonly known as:  NEURONTIN  Take 300-600 mg by mouth 2 (two) times daily. Takes 600 mg in the morning and 300 mg in the evening     latanoprost 0.005 % ophthalmic solution  Commonly known as:  XALATAN  Place 1 drop into both eyes at bedtime.     levothyroxine 88 MCG tablet  Commonly known as:   SYNTHROID, LEVOTHROID  Take 88 mcg by mouth daily before breakfast.     magnesium oxide 400 MG tablet  Commonly known as:  MAG-OX  Take 400 mg by mouth daily.     metoprolol tartrate 25 MG tablet  Commonly known as:  LOPRESSOR  Take 25 mg by mouth 2 (two) times daily.     multivitamin with minerals Tabs tablet  Take 1 tablet by mouth daily.     omeprazole 20 MG capsule  Commonly known as:  PRILOSEC  Take 20 mg by mouth daily.     oxyCODONE-acetaminophen 10-325 MG per tablet  Commonly known as:  PERCOCET  Take 1 tablet by mouth every 4 (four) hours as needed for pain.     potassium chloride 10 MEQ tablet  Commonly known as:  K-DUR  Take 10 mEq by mouth daily.     pravastatin 40 MG tablet  Commonly known as:  PRAVACHOL  Take 40 mg by mouth at bedtime.     triamterene-hydrochlorothiazide 37.5-25 MG per capsule  Commonly known as:  DYAZIDE  Take 1 capsule by mouth every morning.     VISION FORMULA/LUTEIN PO  Take 1 tablet by mouth daily.     zolpidem 5 MG tablet  Commonly known as:  AMBIEN  Take 5 mg by mouth at bedtime as needed for sleep. For sleep         Signed: Cristi Loron 01/10/2013, 7:46 AM

## 2013-01-11 MED ORDER — BISACODYL 10 MG RE SUPP
10.0000 mg | Freq: Every day | RECTAL | Status: DC | PRN
  Administered 2013-01-11: 10 mg via RECTAL
  Filled 2013-01-11: qty 1

## 2013-01-11 MED ORDER — FLEET ENEMA 7-19 GM/118ML RE ENEM: 1.0000 | ENEMA | Freq: Every day | RECTAL | Status: DC | PRN

## 2013-01-11 NOTE — Progress Notes (Signed)
I ambulated the patient. Pt walked up and down the hallway. Returned to room, sitting in chair. Pt tolerated well. Call bell within reach. Fraser Din, RN 01/11/2013 8:57 AM

## 2013-01-11 NOTE — Progress Notes (Signed)
Doing well. Increasing activity  Temp:  [98.1 F (36.7 C)-99 F (37.2 C)] 98.2 F (36.8 C) (08/30 4540) Pulse Rate:  [82-91] 84 (08/30 0608) Resp:  [18-20] 20 (08/30 0608) BP: (105-141)/(41-74) 141/59 mmHg (08/30 0608) SpO2:  [95 %-100 %] 99 % (08/30 0608) Good strength and sensation Incision CDI  Plan: Await SNF

## 2013-01-11 NOTE — Progress Notes (Signed)
Physical Therapy Treatment Patient Details Name: Cynthia Mahoney MRN: [id number] DOB: 11-Nov-1934 Today's Date: 01/11/2013 Time: 1709-1750 PT Time Calculation (min): 41 min  PT Assessment / Plan / Recommendation  History of Present Illness 78 yo female s/p L2-5 laminectomies with L4-5 fusion currently with Aspen corset back brace   PT Comments   Pt completing dinner on arrival. Lengthy discussion re: discharge plans and pt continues to feel she will need time at SNF prior to home alone. She does continue to require education and frequent cues related to back precautions. Obtained information re: home layout and entered in [facility] in case home from acute becomes the goal.   Follow Up Recommendations  SNF (awaiting approval from insurance ?Tues)     Does the patient have the potential to tolerate intense rehabilitation     Barriers to Discharge        Equipment Recommendations  None recommended by PT    Recommendations for Other Services    Frequency Min 5X/week   Progress towards PT Goals Progress towards PT goals: Progressing toward goals  Plan Current plan remains appropriate    Precautions / Restrictions Precautions Precautions: Fall;Back Precaution Comments: pt able to state 2/3 back precautions; reviewed back handout Required Braces or Orthoses: Spinal Brace Spinal Brace: Lumbar corset;Applied in sitting position   Pertinent Vitals/Pain 6/10 back pain; patient repositioned for comfort     Mobility  Bed Mobility Bed Mobility: Rolling Left;Left Sidelying to Sit;Sitting - Scoot to [place] of Bed;Sit to Sidelying Left Rolling Left: 5: Supervision Left Sidelying to Sit: 4: Min guard;HOB flat Sitting - Scoot to Edge of Bed: 5: Supervision Sit to Sidelying Left: 4: Min guard;HOB flat Details for Bed Mobility Assistance: bed set at height to simulate her bed at home; practiced entering and exiting bed on her left side (as she would do at home); vc for sequencing and technique to  maintain back precuations Transfers Transfers: Sit to Stand;Stand to Sit Sit to Stand: 4: Min guard;With upper extremity assist;From elevated surface Stand to Sit: 4: Min guard;With upper extremity assist;To elevated surface Details for Transfer Assistance: x 3; bed & elevated commode; long discussion re: ways to modify her chair and sofa to make them higher (especially different materials she can add under the seat cushion to elevate) Ambulation/Gait Ambulation/Gait Assistance: 5: Supervision Ambulation Distance (Feet): 500 Feet Assistive device: Rolling walker Ambulation/Gait Assistance Details: cues for safe use of RW and upright posture; cues for no twisting to look for items behind her Gait Pattern: Step-through pattern;Antalgic Gait velocity: decr    Exercises     PT Diagnosis:    PT Problem List:   PT Treatment Interventions:     PT Goals (current goals can now be found in the care plan section) Acute Rehab PT Goals Patient Stated Goal: stronger, independent, no pain  Visit Information  Last PT Received On: 01/11/13 Assistance Needed: +1 History of Present Illness: 78 yo female s/p L2-5 laminectomies with L4-5 fusion currently with Aspen corset back brace    Subjective Data  Subjective: pt reports she hopes to go to SNF prior to d/c home. Patient Stated Goal: stronger, independent, no pain   Cognition  Cognition Arousal/Alertness: Awake/alert Behavior During Therapy: WFL for tasks assessed/performed Overall Cognitive Status: Within Functional Limits for tasks assessed    Balance     End of Session PT - End of Session Equipment Utilized During Treatment: Gait belt Activity Tolerance: Patient tolerated treatment well Patient left: in chair;with call bell/phone  within reach Nurse Communication: Mobility status   GP     Cynthia Mahoney 01/11/2013, 6:18 PM Pager [phone]

## 2013-01-12 NOTE — Progress Notes (Signed)
No new problems. Pain better controlled. Mobility improving. Patient considering home discharge with therapy versus skilled nursing facility.  Afebrile. Vital stable. Wound clean and dry. Motor and sensory function stable.  Progressing well. Continue efforts at mobilization. Possible discharge tomorrow if she wants to go home with home health versus probable skilled nursing facility Tuesday.

## 2013-01-13 NOTE — Progress Notes (Signed)
Patient was having lot of pain on lower extremities during the night. Pain meds, valium and warm packs helped some. Pt is upset that she couldn't sleep well.

## 2013-01-13 NOTE — Progress Notes (Signed)
Occupational Therapy Treatment Patient Details Name: Cynthia Mahoney MRN: [id number] DOB: 08/05/1934 Today's Date: 01/13/2013 Time: 6213-0865 OT Time Calculation (min): 9 min  OT Assessment / Plan / Recommendation  History of present illness 78 yo female s/p L2-5 laminectomies with L4-5 fusion currently with Aspen corset back brace   OT comments  Pt demonstrates ability to cross bil LE and ability to use AE. Pt must purchase AE prior to d/c home. Pt fatigues from AM activities and requesting to return to bed. Pt states I have been sitting for 1 hour and I need to lay down now to change positions. Pt demonstrates good carry over from mobility standpoint. Pt lives alone and continues to need education for reinforcement of precautions.    Follow Up Recommendations  SNF;Supervision/Assistance - 24 hour    Barriers to Discharge       Equipment Recommendations  3 in 1 bedside comode    Recommendations for Other Services    Frequency Min 2X/week   Progress towards OT Goals Progress towards OT goals: Progressing toward goals  Plan Discharge plan remains appropriate    Precautions / Restrictions Precautions Precautions: Fall;Back Precaution Comments: pt able to state 2/3 precautions and 3rd with cues Required Braces or Orthoses: Spinal Brace Spinal Brace: Lumbar corset;Applied in sitting position   Pertinent Vitals/Pain 5 out 10  RN in room providing pain medication    ADL  Lower Body Dressing: Modified independent Where Assessed - Lower Body Dressing: Supported sitting (able to cross bil LE) Toilet Transfer: Modified independent Toilet Transfer Method: Sit to stand Toilet Transfer Equipment: Raised toilet seat with arms (or 3-in-1 over toilet) Equipment Used: Back brace;Rolling walker Transfers/Ambulation Related to ADLs: Pt completed chair to bed transfer. Pt with poor recall of don doff back brace. Pt attempting to doff brace in standing.  ADL Comments: Pt in chair requesting to  return to supine. pt with excellent return demo of sock aid and demonstrates abiltiy to cross bil LE at this time. OT removed trial equipment from room. Pt completed chair to bed transfer. pt reports ambulating to and from bathroom alone per MD recommendations. Pt advised to continue to ask for (A) from staff. pt doff brace 100% correct. Pt completed bed mobility mod I. Pt positioned in side lying    OT Diagnosis:    OT Problem List:   OT Treatment Interventions:     OT Goals(current goals can now be found in the care plan section) Acute Rehab OT Goals Patient Stated Goal: stronger, independent, no pain OT Goal Formulation: With patient Time For Goal Achievement: 01/23/13 Potential to Achieve Goals: Good ADL Goals Pt Will Perform Grooming: with modified independence;standing Pt Will Perform Upper Body Bathing: with modified independence;standing Pt Will Perform Lower Body Bathing: with modified independence;sit to/from stand;with adaptive equipment Pt Will Transfer to Toilet: with modified independence;regular height toilet  Visit Information  Last OT Received On: 01/13/13 Assistance Needed: +1 History of Present Illness: 78 yo female s/p L2-5 laminectomies with L4-5 fusion currently with Aspen corset back brace    Subjective Data      Prior Functioning       Cognition  Cognition Arousal/Alertness: Awake/alert Behavior During Therapy: WFL for tasks assessed/performed Overall Cognitive Status: Impaired/Different from baseline Area of Impairment: Safety/judgement Memory: Decreased recall of precautions Safety/Judgement: Decreased awareness of safety General Comments: pt needs continued reinforcement of back brace and precautions    Mobility  Bed Mobility Bed Mobility: Sit to Supine Rolling Right: 5: Supervision Rolling  Left: 4: Min assist Right Sidelying to Sit: 4: Min assist;HOB flat Sitting - Scoot to Edge of Bed: 6: Modified independent (Device/Increase time) Sit to  Supine: 6: Modified independent (Device/Increase time);With rail Details for Bed Mobility Assistance: pt using bed rail to help with descend to bed surface Transfers Sit to Stand: 6: Modified independent (Device/Increase time);With upper extremity assist;From chair/3-in-1 Stand to Sit: 6: Modified independent (Device/Increase time);With upper extremity assist;To bed Details for Transfer Assistance: cues to keep brace don until completely sitting    Exercises      Balance     End of Session OT - End of Session Activity Tolerance: Patient tolerated treatment well Patient left: in chair;with call bell/phone within reach;with bed alarm set Nurse Communication: Mobility status;Precautions  GO     Harolyn Rutherford 01/13/2013, 1:45 PM Pager: [phone]

## 2013-01-13 NOTE — Progress Notes (Signed)
Physical Therapy Treatment Patient Details Name: Cynthia Mahoney MRN: [id number] DOB: 1935/04/21 Today's Date: 01/13/2013 Time: 8295-6213 PT Time Calculation (min): 29 min  PT Assessment / Plan / Recommendation  History of Present Illness 78 yo female s/p L2-5 laminectomies with L4-5 fusion currently with Aspen corset back brace   PT Comments   Pt very upset after a long, painful night with little sleep. Unable to tolerate practicing bed mobility as she will have to do it at home. (RN notified of need for pain meds). Ambulation makes her back feel better, yet educated on the risk of overdoing activity and causing spasms. Also re-educated on limiting her time in sitting as she reported she sat up for several hours prior to going to bed last night. Pt having difficulty remembering her back precautions and how to safely mobilize (pt also forgot she had seen this therapist yesterday and that she had shown me she was able to use her sock aid--? Forgetting due to pain meds or decr memory PTA?) Feel pt will require further education/PT prior to d/c home alone.   Follow Up Recommendations  SNF (pt lives alone)     Does the patient have the potential to tolerate intense rehabilitation     Barriers to Discharge        Equipment Recommendations  None recommended by PT    Recommendations for Other Services    Frequency Min 5X/week   Progress towards PT Goals Progress towards PT goals: Progressing toward goals  Plan Current plan remains appropriate    Precautions / Restrictions Precautions Precautions: Fall;Back Precaution Comments: pt able to state 2/3 precautions and 3rd with cues Required Braces or Orthoses: Spinal Brace Spinal Brace: Lumbar corset;Applied in sitting position   Pertinent Vitals/Pain 20/10 while in bed; decr to 5/10 with walking; patient repositioned for comfort; RN provided medication to assist with pain control     Mobility  Bed Mobility Bed Mobility: Rolling  Left;Sitting - Scoot to Edge of Bed;Rolling Right;Right Sidelying to Sit Rolling Right: 5: Supervision Rolling Left: 4: Min assist Right Sidelying to Sit: 4: Min assist;HOB flat Sitting - Scoot to Edge of Bed: 6: Modified independent (Device/Increase time) Details for Bed Mobility Assistance: pt Rt sidelying on arrival; attempted rolling to Lt to exit on the Lt (as she does at home); pt with severe pain when trying to roll across her incision "20/10" and ultimately refused to fully roll to her Lt because it was too painful (even with assist and despite explanation that she needs to practice as she'll do things at home). Pt had lots of pain and spasms over night and required incr assist today Transfers Transfers: Sit to Stand;Stand to Sit Sit to Stand: 4: Min guard;With upper extremity assist Stand to Sit: 4: Min guard;With upper extremity assist Details for Transfer Assistance: x 2; bed & elevated commode; vc for safe use of RW Ambulation/Gait Ambulation/Gait Assistance: 5: Supervision Ambulation Distance (Feet): 500 Feet Assistive device: Rolling walker Ambulation/Gait Assistance Details: cues for upright posture and stay closer to RW; educated pt on taking shorter but more frequent walks to not "overdo" and cause incr pain/spasms. She insisted on walking the same distance as yesterday.  Gait Pattern: Step-through pattern Gait velocity: decr             PT Goals (current goals can now be found in the care plan section) Acute Rehab PT Goals Patient Stated Goal: stronger, independent, no pain  Visit Information  Last PT Received On: 01/13/13  Assistance Needed: +1 History of Present Illness: 78 yo female s/p L2-5 laminectomies with L4-5 fusion currently with Aspen corset back brace    Subjective Data  Subjective: pt reports she hopes to go to SNF prior to d/c home alone Patient Stated Goal: stronger, independent, no pain   Cognition  Cognition Arousal/Alertness:  Awake/alert Behavior During Therapy: WFL for tasks assessed/performed Overall Cognitive Status: Within Functional Limits for tasks assessed    Balance     End of Session PT - End of Session Equipment Utilized During Treatment: Gait belt;Back brace Activity Tolerance: Patient limited by pain Patient left: in chair;with call bell/phone within reach Nurse Communication: Mobility status;Other (comment);Patient requests pain meds (pt requesting heartburn meds)   GP     Chord Takahashi 01/13/2013, 12:18 PM Pager [phone]

## 2013-01-13 NOTE — Progress Notes (Signed)
Doing well today. Pain recently well-controlled. Mobility continues to improve. Patient has decided that she wishes to be discharged to a skilled nursing facility. Better arrangements are pending.  Afebrile. Vital stable. Neuro exam stable. Chest and abdomen benign.  Doing well following lumbar decompression and fusion. Continue current activity level. Discharge pending bed availability at skilled nursing facility.

## 2013-01-14 ENCOUNTER — Encounter: Payer: Self-pay | Admitting: Internal Medicine

## 2013-01-14 NOTE — Clinical Social Work Note (Addendum)
Pt expressed desire to discharge home rather than wait for insurance authorization for SNF placement. CSW consulted with Case Management, MD, and RN. Pt to be discharged home with home health. CSW notified [facility] of pt's change in discharge disposition.  Darlyn Chamber, MSW, LCSWA Clinical Social Work [phone]  Unit CSW contacted [facility] to inquire about authorization for d/c to SNF.  CSW faxed updated clinicals to [facility] including PT, OT and MD progress notes.  Vickii Penna, LCSWA [phone]  Clinical Social Work

## 2013-01-14 NOTE — Progress Notes (Signed)
Patient ID: ALLEXA ACOFF, female   DOB: 1935-01-30, 78 y.o.   MRN: [id number] Subjective:  The patient is alert and pleasant. She looks well. She is ready to go to [facility] rehabilitation.  Objective: Vital signs in last 24 hours: Temp:  [98.1 F (36.7 C)-98.6 F (37 C)] 98.4 F (36.9 C) (09/02 0600) Pulse Rate:  [79-90] 85 (09/02 0600) Resp:  [16-18] 16 (09/02 0600) BP: (100-125)/(54-71) 116/71 mmHg (09/02 0600) SpO2:  [98 %-100 %] 100 % (09/02 0600)  Intake/Output from previous day:   Intake/Output this shift:    Physical exam patient is alert and oriented. Her strength is normal in her lower extremities. She looks well.  Lab Results: No results found for this basename: WBC, HGB, HCT, PLT,  in the last 72 hours BMET No results found for this basename: NA, K, CL, CO2, GLUCOSE, BUN, CREATININE, CALCIUM,  in the last 72 hours  Studies/Results: No results found.  Assessment/Plan: Postop day #6: We are awaiting skilled nursing facility/[facility] rehabilitation placement. I have answered all her questions. They have been no changes in her discharge summary is as per her 01/10/2013 discharge summary.  LOS: 6 days     Joneric Streight D 01/14/2013, 7:41 AM

## 2013-01-14 NOTE — Progress Notes (Signed)
Finally pt is all set to go home with [facility] PT & OT, informed Dr.Jenkins. Given all pt education, and prescriptions. Will be rolled down in wheel chair.  Johny,RN.

## 2013-01-14 NOTE — Progress Notes (Signed)
Physical Therapy Treatment Patient Details Name: Cynthia Mahoney MRN: [id number] DOB: 1935-04-10 Today's Date: 01/14/2013 Time: 0921-0938 PT Time Calculation (min): 17 min  PT Assessment / Plan / Recommendation  History of Present Illness 78 yo female s/p L2-5 laminectomies with L4-5 fusion currently with Aspen corset back brace   PT Comments   Pt hopeful for d/c to SNF today, states she is waiting on word from CSW.  Pt ambulated around unit with supervision.   Follow Up Recommendations  SNF     Does the patient have the potential to tolerate intense rehabilitation     Barriers to Discharge        Equipment Recommendations  None recommended by PT    Recommendations for Other Services    Frequency Min 5X/week   Progress towards PT Goals Progress towards PT goals: Progressing toward goals  Plan Current plan remains appropriate    Precautions / Restrictions Precautions Precautions: Fall;Back Precaution Comments: pt able to state all back precautions today Required Braces or Orthoses: Spinal Brace Spinal Brace: Lumbar corset;Applied in sitting position   Pertinent Vitals/Pain 8/10 back pain during ambulation, RN notified by call bell, pt repositioned in sidelying with pillow between legs    Mobility  Bed Mobility Bed Mobility: Sit to Sidelying Right Sit to Sidelying Right: 6: Modified independent (Device/Increase time);With rail;HOB flat Details for Bed Mobility Assistance: had pt verbalize log roll technique prior to mobility Transfers Transfers: Sit to Stand;Stand to Sit Sit to Stand: 6: Modified independent (Device/Increase time);With upper extremity assist;From chair/3-in-1 Stand to Sit: 6: Modified independent (Device/Increase time);With upper extremity assist;To bed Details for Transfer Assistance: increased time to doff brace upon sitting EOB to return to supine Ambulation/Gait Ambulation/Gait Assistance: 5: Supervision Ambulation Distance (Feet): 500 Feet Assistive  device: Rolling walker Ambulation/Gait Assistance Details: pt reported 8/10 back pain however agreeble to ambulate full unit Gait Pattern: Step-through pattern;Decreased stride length Gait velocity: decr    Exercises     PT Diagnosis:    PT Problem List:   PT Treatment Interventions:     PT Goals (current goals can now be found in the care plan section)    Visit Information  Last PT Received On: 01/14/13 Assistance Needed: +1 History of Present Illness: 78 yo female s/p L2-5 laminectomies with L4-5 fusion currently with Aspen corset back brace    Subjective Data      Cognition  Cognition Arousal/Alertness: Awake/alert Behavior During Therapy: WFL for tasks assessed/performed Overall Cognitive Status: Impaired/Different from baseline Area of Impairment: Safety/judgement;Memory Safety/Judgement: Decreased awareness of safety General Comments: pt reports being unable to find her room yesterday, and difficulty remembering number today which she stated initially with ambulation however unable to remember without prompting towards end    Balance     End of Session PT - End of Session Equipment Utilized During Treatment: Back brace Activity Tolerance: Patient tolerated treatment well Patient left: in bed;with call bell/phone within reach Nurse Communication: Patient requests pain meds   GP     Taye Cato,KATHrine E 01/14/2013, 9:56 AM Zenovia Jarred, PT, DPT 01/14/2013 Pager: [phone]

## 2013-01-14 NOTE — Care Management Note (Signed)
    Page 1 of 1   01/14/2013     4:41:35 PM   CARE MANAGEMENT NOTE 01/14/2013  Patient:  RAGNA, KRAMLICH   Account Number:  [id number]  Date Initiated:  01/14/2013  Documentation initiated by:  Elmer Bales  Subjective/Objective Assessment:   Patient admitted for lumbar surgery     Action/Plan:   Will follow for discharge needs.   Anticipated DC Date:  01/14/2013   Anticipated DC Plan:  HOME W HOME HEALTH SERVICES      DC Planning Services  CM consult      Choice offered to / List presented to:          [facility] arranged  HH-3 OT  HH-2 PT      [facility] agency  [facility]   Status of service:  Completed, signed off Medicare Important Message given?   (If response is "NO", the following Medicare IM given date fields will be blank) Date Medicare IM given:   Date Additional Medicare IM given:    Discharge Disposition:  HOME W HOME HEALTH SERVICES  Per UR Regulation:  Reviewed for med. necessity/level of care/duration of stay  If discussed at Long Length of Stay Meetings, dates discussed:    Comments:  01/14/13 1615 Elmer Bales RN, MSN, CM- Met with patient and sister to discuss home health needs. Pt has chosen to use [place] for PT/OT, which she has used in the past. Corrie Dandy with [facility] was notified and accepted the referral.

## 2013-02-11 ENCOUNTER — Other Ambulatory Visit (HOSPITAL_COMMUNITY): Payer: Self-pay | Admitting: Neurosurgery

## 2013-02-11 ENCOUNTER — Ambulatory Visit (HOSPITAL_COMMUNITY)
Admission: RE | Admit: 2013-02-11 | Discharge: 2013-02-11 | Disposition: A | Discharge status: Home / Self Care | Payer: Medicare Other | Source: Ambulatory Visit | Attending: Neurosurgery | Admitting: Neurosurgery

## 2013-02-11 DIAGNOSIS — R609 Edema, unspecified: Secondary | ICD-10-CM | POA: Insufficient documentation

## 2013-02-11 DIAGNOSIS — M79604 Pain in right leg: Secondary | ICD-10-CM

## 2013-02-11 DIAGNOSIS — R600 Localized edema: Secondary | ICD-10-CM

## 2013-02-11 DIAGNOSIS — M7989 Other specified soft tissue disorders: Secondary | ICD-10-CM

## 2013-02-11 NOTE — Progress Notes (Signed)
VASCULAR LAB PRELIMINARY  PRELIMINARY  PRELIMINARY  PRELIMINARY  Bilateral lower extremity venous duplex completed.    Preliminary report:  Bilateral:  No evidence of DVT, superficial thrombosis, or Baker's Cyst.   Devario Bucklew, RVS 02/11/2013, 3:59 PM

## 2013-02-12 ENCOUNTER — Encounter: Payer: Self-pay | Admitting: Internal Medicine

## 2013-12-08 ENCOUNTER — Ambulatory Visit: Payer: Self-pay | Admitting: Unknown Physician Specialty

## 2013-12-17 ENCOUNTER — Ambulatory Visit: Payer: Self-pay | Admitting: Family Medicine

## 2014-05-27 DIAGNOSIS — H40033 Anatomical narrow angle, bilateral: Secondary | ICD-10-CM | POA: Diagnosis not present

## 2014-06-18 DIAGNOSIS — J209 Acute bronchitis, unspecified: Secondary | ICD-10-CM | POA: Diagnosis not present

## 2014-06-18 DIAGNOSIS — H6981 Other specified disorders of Eustachian tube, right ear: Secondary | ICD-10-CM | POA: Diagnosis not present

## 2014-06-24 DIAGNOSIS — H40033 Anatomical narrow angle, bilateral: Secondary | ICD-10-CM | POA: Diagnosis not present

## 2014-09-01 NOTE — Op Note (Signed)
PATIENT NAME:  Cynthia Mahoney, KAIN MR#:  [id number] DATE OF BIRTH:  [birth year]  DATE OF PROCEDURE:  04/09/2012  PREOPERATIVE DIAGNOSES:  1. Severe mass of right rotator cuff tear with retraction. 2. Right AC joint arthritis.  3. Severe impingement and acromial spurring, right shoulder.  4. Moderate glenohumeral arthritic change. 5. Old rupture of the biceps tendon.   POSTOPERATIVE DIAGNOSES:  1. Severe mass of right rotator cuff tear with retraction. 2. Right AC joint arthritis.  3. Severe impingement and acromial spurring, right shoulder.  4. Moderate glenohumeral arthritic change. 5. Old rupture of the biceps tendon.   OPERATIONS:  1. Arthroscopic right rotator cuff repair.  2. Arthroscopic right distal clavicle excision.  3. Arthroscopic subacromial decompression with partial acromioplasty.  4. Arthroscopic extensive debridement right shoulder.   SURGEON: Park Breed, MD  ANESTHESIA: General endotracheal plus interscalene block.   COMPLICATIONS: None intraoperatively. Patient had significant edema in chest wall and neck. Anesthesia planned to take her to the recovery room with the endotracheal tube in place as a precaution.   ESTIMATED BLOOD LOSS: Minimal.   REPLACEMENTS: None.   OPERATIVE FINDINGS: Patient had a massive retracted fragmented rotator cuff tear. Posterior portion was very friable. The anterior portion was delaminated. There was significant AC arthritis and there was significant anterior acromial spurring. The glenohumeral joint showed grade 3 to 4 chondromalacia. The biceps tendon had been ruptured remotely. There was significant synovitis.    DESCRIPTION OF PROCEDURE: The patient was brought to the Operating Room where she underwent satisfactory general endotracheal anesthesia in supine position after an interscalene block had been instilled. She was turned to the left lateral decubitus position and padded appropriately on the beanbag. The right shoulder was  prepped and draped in sterile fashion and arthroscopy carried out through standard portals. The above findings were encountered upon entering the joint. The soft tissues were debrided. Took extensive amount of time to expose the rotator cuff anterior and posteriorly and medially due to dense adhesions and bursitis. Once this was accomplished the joint was thoroughly examined and debrided. The biceps was missing but the labrum was intact otherwise. The bur was used to perform an anterior acromioplasty for a large anterior bone spur. The distal clavicle was removed in its entirety through an anterior portal. The cuff was then evaluated. I felt that we could pull the anterior and posterior flaps back together somewhat and then attach it to the footprint of the tuberosity. Several #2 Magnum wires were passed through the anterior and posterior flaps of the cuff and tied in a convergence fashion, bringing the cuff together covering the articular surface of the humeral head. Another suture was placed in the anterior flap of the cuff and a 6.5 speed screw inserted and the sutures tightened to bring this portion of the cuff down to the bone. Two additional Magnum wires were inserted in anterior and posterior flaps to tightness this up more. Complete closure of the cuff was not possible due to the severe retraction and friability of the posterior tissues. Once I had repaired as much as I felt was possible the instruments removed and stab wounds closed with 3-0 nylon suture. Dry sterile dressing and TENS pads and a sling were applied. Once the drapes removed it was evident that the patient had a significant amount of soft tissue edema from fluid in the chest wall and right side of the neck. Anesthesia planned to wait on extubation until she was in the recovery room and they  were sure that she was stable and this will be safe. Plan is to discharge her home if everything is satisfactory. Otherwise we will keep her here in the  hospital overnight for observation as needed.   ____________________________ Park Breed, MD hem:cms D: 04/09/2012 12:53:38 ET T: 04/09/2012 13:15:49 ET JOB#: [id number]  cc: Park Breed, MD, <Dictator> Park Breed MD ELECTRONICALLY SIGNED 04/10/2012 15:59

## 2014-09-01 NOTE — H&P (Signed)
    Subjective/Chief Complaint Left knee pain    History of Present Illness 80 year old female has had progressive osteoarthritis left knee for several years.  She no longer responds to NSAIDs, injections, bracing, or rest.  X-rays show advanced osteoarthritis of the knee with valgus and spurring and cysts in all compartments.  She has had a right total knee replacement and wishes to proceed with the left.  Risks and benefits of surgery were discussed at length including but not limited to infection, non union, nerve or blood vessed damage, non union, need for repeat surgery, blood clots and lung emboli, and death.   Past Med/Surgical Hx:  esophogeal spasms:   Arthritis:   left total knee [id number]:   hysterectomy:   ALLERGIES:  Amoxicillin: Rash  Erythromycin: Itching, Rash  Codeine: N/V/Diarrhea  Family and Social History:   Family History Non-Contributory    Social History negative tobacco, negative ETOH    Place of Living Home   Review of Systems:   Fever/Chills No    Cough No    Sputum No    Abdominal Pain No   Physical Exam:   GEN well developed, well nourished, no acute distress    HEENT pink conjunctivae    NECK supple    RESP normal resp effort    CARD regular rate    ABD denies tenderness    LYMPH negative neck    EXTR negative edema, Left knee with 10* valgus. range of motion 5-105*.  circulation/sensation/motor function good distally.  skin intact.  joint line pain.    SKIN normal to palpation    NEURO motor/sensory function intact    PSYCH alert, A+O to time, place, person, good insight     Assessment/Admission Diagnosis Advanced osteoarthritis left knee    Plan left total knee replacement   Electronic Signatures: Park Breed (MD)  (Signed 19-Aug-13 17:58)  Authored: CHIEF COMPLAINT and HISTORY, PAST MEDICAL/SURGIAL HISTORY, ALLERGIES, FAMILY AND SOCIAL HISTORY, REVIEW OF SYSTEMS, PHYSICAL EXAM, ASSESSMENT AND PLAN   Last Updated:  19-Aug-13 17:58 by Park Breed (MD)

## 2014-09-01 NOTE — Op Note (Signed)
PATIENT NAME:  Cynthia Mahoney, Cynthia Mahoney MR#:  [id number] DATE OF BIRTH:  1935/03/22  DATE OF PROCEDURE:  01/02/2012  PREOPERATIVE DIAGNOSIS: Advanced osteoarthritis, left knee, with valgus angulation.   POSTOPERATIVE DIAGNOSIS: Advanced osteoarthritis, left knee, with valgus angulation.   SURGEON: Park Breed, MD   ASSISTANT: Christophe Louis, MD   ANESTHESIA: Spinal with narcotics.   OPERATION: Left DePuy LCS cemented rotating platform total knee replacement (standard femur/patella, 10 mm polyethylene bearing, #3 keeled tibia).   COMPLICATIONS: None.   DRAINS: Two Autovacs.   ESTIMATED BLOOD LOSS: Minimal.   REPLACEMENT: None.   DESCRIPTION OF PROCEDURE: The patient was brought to the Operating Room where she underwent satisfactory spinal anesthesia with narcotics. The left leg was prepped and draped in sterile fashion and the Esmarch applied. The tourniquet was inflated to 350 mmHg. Tourniquet time was 93 minutes. An anterior midline incision was made and dissection carried out sharply through subcutaneous tissue and medial arthrotomy carried out. Soft tissue dissection was carried out, and the tibial alignment jig was inserted. This was pinned in place and the proximal tibial cut made. The ligaments were balanced, and distal femur was standard as was on the other knee. A centering hole was made and the anterior cutting block and rotation guide inserted. This fit well and showed good alignment. The anterior and posterior cuts were made, and the distal 4-degree left distal femoral cutting guide was inserted. The distal cuts were made. The extension block was inserted, and the extension was good with good alignment. The finishing guide was inserted and the cuts made. The tibial guide was inserted through a #3 trial. The polyethylene trial was inserted along with the femur. These articulated well and had excellent motion and alignment. The patella was cut and drilled and the patellar trial tracked  well. These components were removed, and the knee was thoroughly irrigated and dried while the cement was mixed. The #3 keel tibia, standard femur and patella, were all cemented in place with a 10 mm rotating platform polyethylene. Excess cement was removed, and the cement was allowed to harden. The knee was irrigated and final cleanup done. The soft tissues were infiltrated with 60 mL of 0.25% Marcaine with epinephrine, 30 mg of Toradol, and 4 mg of morphine. Bone wax was placed on raw bony surfaces. Two Autovacs were inserted, and the capsule was closed with #2 quill suture, and the subcutaneous tissue was closed with 2-0 quill after further irrigation. The skin was closed with staples. A dry sterile dressing with TENS pads and Polar Care were applied. A knee immobilizer was applied. The tourniquet was deflated with good return of blood flow. The patient was transferred to her hospital bed and taken to recovery in good condition.   ____________________________ Park Breed, MD hem:cbb D: 01/02/2012 10:21:50 ET T: 01/02/2012 11:26:58 ET JOB#: [id number]  cc: Park Breed, MD, <Dictator> Park Breed MD ELECTRONICALLY SIGNED 01/02/2012 17:09

## 2014-09-01 NOTE — Discharge Summary (Signed)
PATIENT NAME:  Cynthia Mahoney, Cynthia Mahoney MR#:  [id number] DATE OF BIRTH:  May 08, 1935  DATE OF ADMISSION:  01/02/2012 DATE OF DISCHARGE:  01/05/2012   FINAL DIAGNOSES:  1. Advanced osteoarthritis, left knee. 2. Esophageal spasms.   OPERATION: 01/02/2012 cemented DePuy rotating platform LCS total knee replacement.   COMPLICATIONS: None.   CONSULTATIONS: None.   DISCHARGE MEDICATIONS:  1. Synthroid 0.008 mg daily.  2. Lopressor 25 mg b.i.d.  3. Vision Formula lutein tablets b.i.d.  4. Prilosec 20 mg q.a.m.  5. Potassium chloride 10 mEq daily.  6. Pravachol 40 mg at bedtime.  7. Timolol drops both eyes b.i.d.  8. Ambien 5 mg p.r.n. at night. 9. Norco 5/325 1 to 2 p.o. q.4 hours p.r.n. pain.  10. Enteric-coated aspirin one p.o. b.i.d. for six weeks.  11. Iron 1 p.o. daily.  12. Stool softeners as needed.   HISTORY OF PRESENT ILLNESS: The patient is a 80 year old female with advanced osteoarthritis of the left knee. She has had treatment for several years including NSAIDs, cortisone shots, <hyaluronic >> acid injections, bracing, and rest. She's had exercise program. She continued to have persistent pain and wished to proceed with left knee replacement. She has had a prior right knee replacement with which she has done well. X-rays showed advanced osteoarthritis of the left knee with arthritis in all compartments and some increased valgus. There was spurs and cyst formation.   PAST MEDICAL HISTORY: ILLNESSES: As above.   MEDICATIONS: As above.   ALLERGIES: Amoxicillin, codeine, erythromycin.  REVIEW OF SYSTEMS: Unremarkable.   SOCIAL HISTORY: The patient lives with her husband. She does not smoke or drink.   PHYSICAL EXAMINATION: The patient is alert and cooperative, fully oriented. Vital signs are normal. The left knee showed increased valgus with motion from 5 to 100 degrees. There was pain with movement. Spurs were palpable. There was joint line pain. Ligaments are stable. Neurovascular  status is good distally. The skin was intact.   LABORATORY DATA: Laboratory data on admission satisfactory.   HOSPITAL COURSE: On 01/02/2012 the patient underwent left cemented LCS total knee replacement. She did well postoperatively with no significant pain or problems. Hemoglobin remained stable and was 9.1 on the second postop day. She made satisfactory progress with physical therapy. She is to be discharged to skilled nursing facility on 01/05/2012.   REHABILITATION POTENTIAL: Good.   FOLLOW-UP: She will be seen in my office in two weeks for exam and x-rays of the knee.   ____________________________ Park Breed, MD hem:drc D: 01/04/2012 13:10:00 ET T: 01/04/2012 13:22:49 ET JOB#: [id number]  cc: Park Breed, MD, <Dictator> Park Breed MD ELECTRONICALLY SIGNED 01/06/2012 16:01

## 2014-09-17 DIAGNOSIS — H402233 Chronic angle-closure glaucoma, bilateral, severe stage: Secondary | ICD-10-CM | POA: Diagnosis not present

## 2014-10-08 DIAGNOSIS — E780 Pure hypercholesterolemia: Secondary | ICD-10-CM | POA: Diagnosis not present

## 2014-10-08 DIAGNOSIS — Z79899 Other long term (current) drug therapy: Secondary | ICD-10-CM | POA: Diagnosis not present

## 2014-10-08 DIAGNOSIS — E039 Hypothyroidism, unspecified: Secondary | ICD-10-CM | POA: Diagnosis not present

## 2014-10-09 DIAGNOSIS — J069 Acute upper respiratory infection, unspecified: Secondary | ICD-10-CM | POA: Diagnosis not present

## 2014-10-19 DIAGNOSIS — Z0000 Encounter for general adult medical examination without abnormal findings: Secondary | ICD-10-CM | POA: Diagnosis not present

## 2014-10-19 DIAGNOSIS — E039 Hypothyroidism, unspecified: Secondary | ICD-10-CM | POA: Diagnosis not present

## 2014-10-19 DIAGNOSIS — Z79899 Other long term (current) drug therapy: Secondary | ICD-10-CM | POA: Diagnosis not present

## 2014-10-19 DIAGNOSIS — E780 Pure hypercholesterolemia: Secondary | ICD-10-CM | POA: Diagnosis not present

## 2014-11-03 DIAGNOSIS — Z6832 Body mass index (BMI) 32.0-32.9, adult: Secondary | ICD-10-CM | POA: Diagnosis not present

## 2014-11-03 DIAGNOSIS — M545 Low back pain: Secondary | ICD-10-CM | POA: Diagnosis not present

## 2014-11-30 DIAGNOSIS — L821 Other seborrheic keratosis: Secondary | ICD-10-CM | POA: Diagnosis not present

## 2014-11-30 DIAGNOSIS — L578 Other skin changes due to chronic exposure to nonionizing radiation: Secondary | ICD-10-CM | POA: Diagnosis not present

## 2014-11-30 DIAGNOSIS — L570 Actinic keratosis: Secondary | ICD-10-CM | POA: Diagnosis not present

## 2014-11-30 DIAGNOSIS — D229 Melanocytic nevi, unspecified: Secondary | ICD-10-CM | POA: Diagnosis not present

## 2014-12-12 DIAGNOSIS — J069 Acute upper respiratory infection, unspecified: Secondary | ICD-10-CM | POA: Diagnosis not present

## 2015-01-27 DIAGNOSIS — H402233 Chronic angle-closure glaucoma, bilateral, severe stage: Secondary | ICD-10-CM | POA: Diagnosis not present

## 2015-02-04 DIAGNOSIS — I872 Venous insufficiency (chronic) (peripheral): Secondary | ICD-10-CM | POA: Diagnosis not present

## 2015-03-04 DIAGNOSIS — L570 Actinic keratosis: Secondary | ICD-10-CM | POA: Diagnosis not present

## 2015-03-04 DIAGNOSIS — L720 Epidermal cyst: Secondary | ICD-10-CM | POA: Diagnosis not present

## 2015-03-04 DIAGNOSIS — L578 Other skin changes due to chronic exposure to nonionizing radiation: Secondary | ICD-10-CM | POA: Diagnosis not present

## 2015-03-04 DIAGNOSIS — L821 Other seborrheic keratosis: Secondary | ICD-10-CM | POA: Diagnosis not present

## 2015-03-23 DIAGNOSIS — Z79899 Other long term (current) drug therapy: Secondary | ICD-10-CM | POA: Diagnosis not present

## 2015-03-23 DIAGNOSIS — E039 Hypothyroidism, unspecified: Secondary | ICD-10-CM | POA: Diagnosis not present

## 2015-03-23 DIAGNOSIS — E7800 Pure hypercholesterolemia, unspecified: Secondary | ICD-10-CM | POA: Diagnosis not present

## 2015-03-30 DIAGNOSIS — Z79899 Other long term (current) drug therapy: Secondary | ICD-10-CM | POA: Diagnosis not present

## 2015-03-30 DIAGNOSIS — K219 Gastro-esophageal reflux disease without esophagitis: Secondary | ICD-10-CM | POA: Diagnosis not present

## 2015-03-30 DIAGNOSIS — N183 Chronic kidney disease, stage 3 (moderate): Secondary | ICD-10-CM | POA: Diagnosis not present

## 2015-03-30 DIAGNOSIS — E7800 Pure hypercholesterolemia, unspecified: Secondary | ICD-10-CM | POA: Diagnosis not present

## 2015-03-30 DIAGNOSIS — E039 Hypothyroidism, unspecified: Secondary | ICD-10-CM | POA: Diagnosis not present

## 2015-03-30 DIAGNOSIS — I10 Essential (primary) hypertension: Secondary | ICD-10-CM | POA: Diagnosis not present

## 2015-03-30 DIAGNOSIS — I872 Venous insufficiency (chronic) (peripheral): Secondary | ICD-10-CM | POA: Diagnosis not present

## 2015-04-13 DIAGNOSIS — J208 Acute bronchitis due to other specified organisms: Secondary | ICD-10-CM | POA: Diagnosis not present

## 2015-04-24 DIAGNOSIS — K529 Noninfective gastroenteritis and colitis, unspecified: Secondary | ICD-10-CM | POA: Diagnosis not present

## 2015-04-28 DIAGNOSIS — H402233 Chronic angle-closure glaucoma, bilateral, severe stage: Secondary | ICD-10-CM | POA: Diagnosis not present

## 2015-06-09 DIAGNOSIS — H402233 Chronic angle-closure glaucoma, bilateral, severe stage: Secondary | ICD-10-CM | POA: Diagnosis not present

## 2015-07-13 DIAGNOSIS — M545 Low back pain: Secondary | ICD-10-CM | POA: Diagnosis not present

## 2015-07-13 DIAGNOSIS — Z6832 Body mass index (BMI) 32.0-32.9, adult: Secondary | ICD-10-CM | POA: Diagnosis not present

## 2015-07-13 DIAGNOSIS — I10 Essential (primary) hypertension: Secondary | ICD-10-CM | POA: Diagnosis not present

## 2015-10-13 DIAGNOSIS — H402233 Chronic angle-closure glaucoma, bilateral, severe stage: Secondary | ICD-10-CM | POA: Diagnosis not present

## 2015-10-19 DIAGNOSIS — Z79899 Other long term (current) drug therapy: Secondary | ICD-10-CM | POA: Diagnosis not present

## 2015-10-19 DIAGNOSIS — E039 Hypothyroidism, unspecified: Secondary | ICD-10-CM | POA: Diagnosis not present

## 2015-10-19 DIAGNOSIS — E7800 Pure hypercholesterolemia, unspecified: Secondary | ICD-10-CM | POA: Diagnosis not present

## 2015-10-19 DIAGNOSIS — N183 Chronic kidney disease, stage 3 (moderate): Secondary | ICD-10-CM | POA: Diagnosis not present

## 2015-10-26 ENCOUNTER — Other Ambulatory Visit: Payer: Self-pay | Admitting: Family Medicine

## 2015-10-26 DIAGNOSIS — R739 Hyperglycemia, unspecified: Secondary | ICD-10-CM | POA: Diagnosis not present

## 2015-10-26 DIAGNOSIS — E7800 Pure hypercholesterolemia, unspecified: Secondary | ICD-10-CM | POA: Diagnosis not present

## 2015-10-26 DIAGNOSIS — Z0000 Encounter for general adult medical examination without abnormal findings: Secondary | ICD-10-CM | POA: Diagnosis not present

## 2015-10-26 DIAGNOSIS — E039 Hypothyroidism, unspecified: Secondary | ICD-10-CM | POA: Diagnosis not present

## 2015-10-26 DIAGNOSIS — Z1231 Encounter for screening mammogram for malignant neoplasm of breast: Secondary | ICD-10-CM

## 2015-10-26 DIAGNOSIS — Z79899 Other long term (current) drug therapy: Secondary | ICD-10-CM | POA: Diagnosis not present

## 2015-11-10 ENCOUNTER — Ambulatory Visit
Admission: RE | Admit: 2015-11-10 | Discharge: 2015-11-10 | Disposition: A | Discharge status: Home / Self Care | Payer: Commercial Managed Care - HMO | Source: Ambulatory Visit | Attending: Family Medicine | Admitting: Family Medicine

## 2015-11-10 DIAGNOSIS — Z1231 Encounter for screening mammogram for malignant neoplasm of breast: Secondary | ICD-10-CM | POA: Diagnosis not present

## 2015-11-25 DIAGNOSIS — L820 Inflamed seborrheic keratosis: Secondary | ICD-10-CM | POA: Diagnosis not present

## 2015-11-25 DIAGNOSIS — L570 Actinic keratosis: Secondary | ICD-10-CM | POA: Diagnosis not present

## 2015-11-25 DIAGNOSIS — L578 Other skin changes due to chronic exposure to nonionizing radiation: Secondary | ICD-10-CM | POA: Diagnosis not present

## 2016-01-11 DIAGNOSIS — Z6833 Body mass index (BMI) 33.0-33.9, adult: Secondary | ICD-10-CM | POA: Diagnosis not present

## 2016-01-11 DIAGNOSIS — M545 Low back pain: Secondary | ICD-10-CM | POA: Diagnosis not present

## 2016-01-11 DIAGNOSIS — I10 Essential (primary) hypertension: Secondary | ICD-10-CM | POA: Diagnosis not present

## 2016-02-10 DIAGNOSIS — Z23 Encounter for immunization: Secondary | ICD-10-CM | POA: Diagnosis not present

## 2016-02-10 DIAGNOSIS — J0100 Acute maxillary sinusitis, unspecified: Secondary | ICD-10-CM | POA: Diagnosis not present

## 2016-02-16 DIAGNOSIS — H402233 Chronic angle-closure glaucoma, bilateral, severe stage: Secondary | ICD-10-CM | POA: Diagnosis not present

## 2016-03-14 DIAGNOSIS — R252 Cramp and spasm: Secondary | ICD-10-CM | POA: Diagnosis not present

## 2016-04-25 DIAGNOSIS — Z79899 Other long term (current) drug therapy: Secondary | ICD-10-CM | POA: Diagnosis not present

## 2016-04-25 DIAGNOSIS — E039 Hypothyroidism, unspecified: Secondary | ICD-10-CM | POA: Diagnosis not present

## 2016-04-25 DIAGNOSIS — R739 Hyperglycemia, unspecified: Secondary | ICD-10-CM | POA: Diagnosis not present

## 2016-04-25 DIAGNOSIS — I10 Essential (primary) hypertension: Secondary | ICD-10-CM | POA: Diagnosis not present

## 2016-04-25 DIAGNOSIS — E7800 Pure hypercholesterolemia, unspecified: Secondary | ICD-10-CM | POA: Diagnosis not present

## 2016-04-25 DIAGNOSIS — K219 Gastro-esophageal reflux disease without esophagitis: Secondary | ICD-10-CM | POA: Diagnosis not present

## 2016-05-09 DIAGNOSIS — J208 Acute bronchitis due to other specified organisms: Secondary | ICD-10-CM | POA: Diagnosis not present

## 2016-05-09 DIAGNOSIS — B9689 Other specified bacterial agents as the cause of diseases classified elsewhere: Secondary | ICD-10-CM | POA: Diagnosis not present

## 2016-06-15 DIAGNOSIS — H402233 Chronic angle-closure glaucoma, bilateral, severe stage: Secondary | ICD-10-CM | POA: Diagnosis not present

## 2016-06-21 DIAGNOSIS — H402233 Chronic angle-closure glaucoma, bilateral, severe stage: Secondary | ICD-10-CM | POA: Diagnosis not present

## 2016-07-04 DIAGNOSIS — Z6832 Body mass index (BMI) 32.0-32.9, adult: Secondary | ICD-10-CM | POA: Diagnosis not present

## 2016-07-04 DIAGNOSIS — M545 Low back pain: Secondary | ICD-10-CM | POA: Diagnosis not present

## 2016-07-04 DIAGNOSIS — I10 Essential (primary) hypertension: Secondary | ICD-10-CM | POA: Diagnosis not present

## 2016-08-16 DIAGNOSIS — R05 Cough: Secondary | ICD-10-CM | POA: Diagnosis not present

## 2016-08-17 DIAGNOSIS — R05 Cough: Secondary | ICD-10-CM | POA: Diagnosis not present

## 2016-10-02 ENCOUNTER — Other Ambulatory Visit: Payer: Self-pay | Admitting: Family Medicine

## 2016-10-02 DIAGNOSIS — Z1231 Encounter for screening mammogram for malignant neoplasm of breast: Secondary | ICD-10-CM

## 2016-10-30 DIAGNOSIS — Z79899 Other long term (current) drug therapy: Secondary | ICD-10-CM | POA: Diagnosis not present

## 2016-10-30 DIAGNOSIS — E039 Hypothyroidism, unspecified: Secondary | ICD-10-CM | POA: Diagnosis not present

## 2016-10-30 DIAGNOSIS — E7800 Pure hypercholesterolemia, unspecified: Secondary | ICD-10-CM | POA: Diagnosis not present

## 2016-11-06 DIAGNOSIS — Z79899 Other long term (current) drug therapy: Secondary | ICD-10-CM | POA: Diagnosis not present

## 2016-11-06 DIAGNOSIS — Z0000 Encounter for general adult medical examination without abnormal findings: Secondary | ICD-10-CM | POA: Diagnosis not present

## 2016-11-06 DIAGNOSIS — E039 Hypothyroidism, unspecified: Secondary | ICD-10-CM | POA: Diagnosis not present

## 2016-11-06 DIAGNOSIS — E7800 Pure hypercholesterolemia, unspecified: Secondary | ICD-10-CM | POA: Diagnosis not present

## 2016-11-06 DIAGNOSIS — I10 Essential (primary) hypertension: Secondary | ICD-10-CM | POA: Diagnosis not present

## 2016-11-13 ENCOUNTER — Ambulatory Visit
Admission: RE | Admit: 2016-11-13 | Discharge: 2016-11-13 | Disposition: A | Discharge status: Home / Self Care | Payer: Commercial Managed Care - HMO | Source: Ambulatory Visit | Attending: Family Medicine | Admitting: Family Medicine

## 2016-11-13 DIAGNOSIS — Z1231 Encounter for screening mammogram for malignant neoplasm of breast: Secondary | ICD-10-CM | POA: Insufficient documentation

## 2016-12-18 DIAGNOSIS — R05 Cough: Secondary | ICD-10-CM | POA: Diagnosis not present

## 2016-12-18 DIAGNOSIS — W57XXXA Bitten or stung by nonvenomous insect and other nonvenomous arthropods, initial encounter: Secondary | ICD-10-CM | POA: Diagnosis not present

## 2016-12-18 DIAGNOSIS — J309 Allergic rhinitis, unspecified: Secondary | ICD-10-CM | POA: Diagnosis not present

## 2016-12-27 DIAGNOSIS — H402233 Chronic angle-closure glaucoma, bilateral, severe stage: Secondary | ICD-10-CM | POA: Diagnosis not present

## 2016-12-28 ENCOUNTER — Observation Stay
Admission: EM | Admit: 2016-12-28 | Discharge: 2016-12-29 | Disposition: A | Discharge status: Home / Self Care | Payer: Medicare HMO | Attending: Internal Medicine | Admitting: Internal Medicine

## 2016-12-28 ENCOUNTER — Encounter: Payer: Self-pay | Admitting: Emergency Medicine

## 2016-12-28 ENCOUNTER — Inpatient Hospital Stay: Payer: Medicare HMO

## 2016-12-28 ENCOUNTER — Emergency Department: Payer: Medicare HMO

## 2016-12-28 DIAGNOSIS — Z96653 Presence of artificial knee joint, bilateral: Secondary | ICD-10-CM | POA: Insufficient documentation

## 2016-12-28 DIAGNOSIS — E876 Hypokalemia: Secondary | ICD-10-CM | POA: Insufficient documentation

## 2016-12-28 DIAGNOSIS — E039 Hypothyroidism, unspecified: Secondary | ICD-10-CM | POA: Insufficient documentation

## 2016-12-28 DIAGNOSIS — E860 Dehydration: Secondary | ICD-10-CM | POA: Diagnosis not present

## 2016-12-28 DIAGNOSIS — Z7982 Long term (current) use of aspirin: Secondary | ICD-10-CM | POA: Insufficient documentation

## 2016-12-28 DIAGNOSIS — K219 Gastro-esophageal reflux disease without esophagitis: Secondary | ICD-10-CM | POA: Diagnosis not present

## 2016-12-28 DIAGNOSIS — D72829 Elevated white blood cell count, unspecified: Secondary | ICD-10-CM | POA: Diagnosis not present

## 2016-12-28 DIAGNOSIS — G629 Polyneuropathy, unspecified: Secondary | ICD-10-CM | POA: Diagnosis not present

## 2016-12-28 DIAGNOSIS — E871 Hypo-osmolality and hyponatremia: Secondary | ICD-10-CM | POA: Diagnosis not present

## 2016-12-28 DIAGNOSIS — H409 Unspecified glaucoma: Secondary | ICD-10-CM | POA: Insufficient documentation

## 2016-12-28 DIAGNOSIS — R55 Syncope and collapse: Secondary | ICD-10-CM | POA: Diagnosis not present

## 2016-12-28 DIAGNOSIS — I10 Essential (primary) hypertension: Secondary | ICD-10-CM | POA: Diagnosis not present

## 2016-12-28 DIAGNOSIS — N179 Acute kidney failure, unspecified: Secondary | ICD-10-CM | POA: Insufficient documentation

## 2016-12-28 DIAGNOSIS — Z79899 Other long term (current) drug therapy: Secondary | ICD-10-CM | POA: Diagnosis not present

## 2016-12-28 DIAGNOSIS — I6601 Occlusion and stenosis of right middle cerebral artery: Secondary | ICD-10-CM | POA: Diagnosis not present

## 2016-12-28 DIAGNOSIS — R1111 Vomiting without nausea: Secondary | ICD-10-CM | POA: Diagnosis not present

## 2016-12-28 DIAGNOSIS — R05 Cough: Secondary | ICD-10-CM | POA: Insufficient documentation

## 2016-12-28 DIAGNOSIS — J90 Pleural effusion, not elsewhere classified: Secondary | ICD-10-CM | POA: Diagnosis not present

## 2016-12-28 LAB — BASIC METABOLIC PANEL
ANION GAP: 10 (ref 5–15)
BUN: 22 mg/dL — ABNORMAL HIGH (ref 6–20)
CO2: 26 mmol/L (ref 22–32)
Calcium: 9.1 mg/dL (ref 8.9–10.3)
Chloride: 91 mmol/L — ABNORMAL LOW (ref 101–111)
Creatinine, Ser: 1.31 mg/dL — ABNORMAL HIGH (ref 0.44–1.00)
GFR calc non Af Amer: 38 mL/min — ABNORMAL LOW (ref >60)
GFR, EST AFRICAN AMERICAN: 44 mL/min — AB (ref >60)
GLUCOSE: 129 mg/dL — AB (ref 65–99)
POTASSIUM: 3.4 mmol/L — AB (ref 3.5–5.1)
Sodium: 127 mmol/L — ABNORMAL LOW (ref 135–145)

## 2016-12-28 LAB — TROPONIN I: Troponin I: <0.03 ng/mL (ref <0.03)

## 2016-12-28 LAB — CBC
HEMATOCRIT: 39.6 % (ref 35.0–47.0)
HEMOGLOBIN: 13.5 g/dL (ref 12.0–16.0)
MCH: 32.2 pg (ref 26.0–34.0)
MCHC: 34.1 g/dL (ref 32.0–36.0)
MCV: 94.5 fL (ref 80.0–100.0)
Platelets: 278 10*3/uL (ref 150–440)
RBC: 4.19 MIL/uL (ref 3.80–5.20)
RDW: 12.9 % (ref 11.5–14.5)
WBC: 13.2 10*3/uL — ABNORMAL HIGH (ref 3.6–11.0)

## 2016-12-28 MED ORDER — LEVOTHYROXINE SODIUM 88 MCG PO TABS
88.0000 ug | ORAL_TABLET | Freq: Every day | ORAL | Status: DC
  Administered 2016-12-29: 88 ug via ORAL
  Filled 2016-12-28: qty 1

## 2016-12-28 MED ORDER — IPRATROPIUM-ALBUTEROL 0.5-2.5 (3) MG/3ML IN SOLN
3.0000 mL | Freq: Four times a day (QID) | RESPIRATORY_TRACT | Status: DC
  Administered 2016-12-28: 3 mL via RESPIRATORY_TRACT
  Filled 2016-12-28: qty 3

## 2016-12-28 MED ORDER — GABAPENTIN 300 MG PO CAPS
300.0000 mg | ORAL_CAPSULE | Freq: Every day | ORAL | Status: DC
  Administered 2016-12-28: 300 mg via ORAL
  Filled 2016-12-28 (×2): qty 1

## 2016-12-28 MED ORDER — GABAPENTIN 300 MG PO CAPS
600.0000 mg | ORAL_CAPSULE | Freq: Every morning | ORAL | Status: DC
  Administered 2016-12-29: 600 mg via ORAL
  Filled 2016-12-28: qty 2

## 2016-12-28 MED ORDER — SODIUM CHLORIDE 0.9 % IV BOLUS (SEPSIS)
1000.0000 mL | Freq: Once | INTRAVENOUS | Status: AC
  Administered 2016-12-28: 1000 mL via INTRAVENOUS

## 2016-12-28 MED ORDER — LATANOPROST 0.005 % OP SOLN: 1.0000 [drp] | Freq: Every day | OPHTHALMIC | Status: DC

## 2016-12-28 MED ORDER — ACETAMINOPHEN 325 MG PO TABS: 650.0000 mg | ORAL_TABLET | Freq: Four times a day (QID) | ORAL | Status: DC | PRN

## 2016-12-28 MED ORDER — SODIUM CHLORIDE 0.9 % IV SOLN
INTRAVENOUS | Status: DC
  Administered 2016-12-28: 22:00:00 via INTRAVENOUS

## 2016-12-28 MED ORDER — IPRATROPIUM-ALBUTEROL 0.5-2.5 (3) MG/3ML IN SOLN: 3.0000 mL | Freq: Four times a day (QID) | RESPIRATORY_TRACT | Status: DC | PRN

## 2016-12-28 MED ORDER — DOCUSATE SODIUM 100 MG PO CAPS
100.0000 mg | ORAL_CAPSULE | Freq: Two times a day (BID) | ORAL | Status: DC
  Administered 2016-12-29: 100 mg via ORAL
  Filled 2016-12-28: qty 1

## 2016-12-28 MED ORDER — ASPIRIN EC 81 MG PO TBEC
81.0000 mg | DELAYED_RELEASE_TABLET | Freq: Every day | ORAL | Status: DC
  Administered 2016-12-29: 81 mg via ORAL
  Filled 2016-12-28: qty 1

## 2016-12-28 MED ORDER — ONDANSETRON HCL 4 MG PO TABS: 4.0000 mg | ORAL_TABLET | Freq: Four times a day (QID) | ORAL | Status: DC | PRN

## 2016-12-28 MED ORDER — DORZOLAMIDE HCL-TIMOLOL MAL 2-0.5 % OP SOLN
1.0000 [drp] | Freq: Two times a day (BID) | OPHTHALMIC | Status: DC
  Filled 2016-12-28: qty 10

## 2016-12-28 MED ORDER — ONDANSETRON HCL 4 MG/2ML IJ SOLN: 4.0000 mg | Freq: Four times a day (QID) | INTRAMUSCULAR | Status: DC | PRN

## 2016-12-28 MED ORDER — GABAPENTIN 300 MG PO CAPS: 300.0000 mg | ORAL_CAPSULE | Freq: Two times a day (BID) | ORAL | Status: DC

## 2016-12-28 MED ORDER — MAGNESIUM OXIDE 400 (241.3 MG) MG PO TABS
400.0000 mg | ORAL_TABLET | Freq: Every day | ORAL | Status: DC
  Administered 2016-12-29: 400 mg via ORAL
  Filled 2016-12-28: qty 1

## 2016-12-28 MED ORDER — ACETAMINOPHEN 650 MG RE SUPP: 650.0000 mg | Freq: Four times a day (QID) | RECTAL | Status: DC | PRN

## 2016-12-28 MED ORDER — PANTOPRAZOLE SODIUM 40 MG PO TBEC
40.0000 mg | DELAYED_RELEASE_TABLET | Freq: Every day | ORAL | Status: DC
Start: 2016-12-28 — End: 2016-12-29
  Administered 2016-12-29: 40 mg via ORAL
  Filled 2016-12-28: qty 1

## 2016-12-28 MED ORDER — HEPARIN SODIUM (PORCINE) 5000 UNIT/ML IJ SOLN
5000.0000 [IU] | Freq: Three times a day (TID) | INTRAMUSCULAR | Status: DC
  Administered 2016-12-28 – 2016-12-29 (×2): 5000 [IU] via SUBCUTANEOUS
  Filled 2016-12-28 (×2): qty 1

## 2016-12-28 MED ORDER — PRAVASTATIN SODIUM 40 MG PO TABS
40.0000 mg | ORAL_TABLET | Freq: Every day | ORAL | Status: DC
  Administered 2016-12-28: 40 mg via ORAL
  Filled 2016-12-28: qty 1

## 2016-12-28 MED ORDER — POTASSIUM CHLORIDE ER 10 MEQ PO TBCR
10.0000 meq | EXTENDED_RELEASE_TABLET | Freq: Every day | ORAL | Status: DC
  Administered 2016-12-29: 10 meq via ORAL
  Filled 2016-12-28: qty 1

## 2016-12-28 MED ORDER — METOPROLOL TARTRATE 25 MG PO TABS
25.0000 mg | ORAL_TABLET | Freq: Two times a day (BID) | ORAL | Status: DC
  Administered 2016-12-28 – 2016-12-29 (×2): 25 mg via ORAL
  Filled 2016-12-28 (×2): qty 1

## 2016-12-28 MED ORDER — TRAZODONE HCL 50 MG PO TABS
50.0000 mg | ORAL_TABLET | Freq: Every day | ORAL | Status: DC
  Administered 2016-12-28: 50 mg via ORAL
  Filled 2016-12-28: qty 1

## 2016-12-28 NOTE — ED Provider Notes (Signed)
[facility] Emergency Department Provider Note   ____________________________________________   I have reviewed the triage vital signs and the nursing notes.   HISTORY  Chief Complaint Loss of Consciousness   History limited by: Not Limited   HPI Cynthia Mahoney is a 82 y.o. female who presents to the emergency department today via EMS after passing out. The patient states that she has not felt well all day. She is in fact been ill for the past roughly 2 weeks. Primarily complaining of cough and congestion type symptoms. She had seen her primary care doctor for this and recently finished a steroid pack. Sporting she states she continued to feel not great. She did have a headache. She is continuing to have a cough. She ate [employer] for lunch today and stated that afterwards she felt sick to her stomach. She felt nauseous. She was waiting to pick up her granddaughter so she was sitting in her car. She turned on her air-conditioning. She then passed out. By the time I examination she states she is feeling better. She denies any chest pain or fluttering.    Past Medical History:  Diagnosis Date  . Anxiety   . Arthritis   . GERD (gastroesophageal reflux disease)   . Headache(784.0)    stress  . Hypertension   . Hypothyroidism   . PONV (postoperative nausea and vomiting)     There are no active problems to display for this patient.   Past Surgical History:  Procedure Laterality Date  . [place] Bilateral 2008, 2013   knee  . SHOULDER ARTHROSCOPY WITH OPEN ROTATOR CUFF REPAIR Right 04/2012  . TONSILLECTOMY      Prior to Admission medications   Medication Sig Start Date End Date Taking? Authorizing Provider  aspirin 81 MG tablet Take 81 mg by mouth daily.    [provider]  calcium carbonate (OS-CAL) 600 MG TABS tablet Take 600 mg by mouth daily.    [provider]   diazepam (VALIUM) 5 MG tablet Take 1 tablet (5 mg total) by mouth every 6 (six) hours as needed. 01/10/13   Newman Pies, MD  docusate sodium 100 MG CAPS Take 100 mg by mouth 2 (two) times daily. 01/10/13   Newman Pies, MD  dorzolamide-timolol (COSOPT) 22.3-6.8 MG/ML ophthalmic solution Place 1 drop into both eyes 2 (two) times daily.    [provider]  gabapentin (NEURONTIN) 300 MG capsule Take 300-600 mg by mouth 2 (two) times daily. Takes 600 mg in the morning and 300 mg in the evening    [provider]  latanoprost (XALATAN) 0.005 % ophthalmic solution Place 1 drop into both eyes at bedtime.    [provider]  levothyroxine (SYNTHROID, LEVOTHROID) 88 MCG tablet Take 88 mcg by mouth daily before breakfast.    [provider]  magnesium oxide (MAG-OX) 400 MG tablet Take 400 mg by mouth daily.    [provider]  metoprolol tartrate (LOPRESSOR) 25 MG tablet Take 25 mg by mouth 2 (two) times daily.    [provider]  Multiple Vitamin (MULTIVITAMIN WITH MINERALS) TABS tablet Take 1 tablet by mouth daily.    [provider]  Multiple Vitamins-Minerals (VISION FORMULA/LUTEIN PO) Take 1 tablet by mouth daily.    [provider]  omeprazole (PRILOSEC) 20 MG capsule Take 20 mg by mouth daily.    [provider]  oxyCODONE-acetaminophen (PERCOCET) 10-325 MG  per tablet Take 1 tablet by mouth every 4 (four) hours as needed for pain. 01/10/13   Newman Pies, MD  potassium chloride (K-DUR) 10 MEQ tablet Take 10 mEq by mouth daily.    [provider]  pravastatin (PRAVACHOL) 40 MG tablet Take 40 mg by mouth at bedtime.     [provider]  triamterene-hydrochlorothiazide (DYAZIDE) 37.5-25 MG per capsule Take 1 capsule by mouth every morning.    [provider]  zolpidem (AMBIEN) 5 MG tablet Take 5 mg by mouth at bedtime as needed for sleep. For sleep    [provider]     Allergies Amoxicillin and Erythromycin  Family History  Problem Relation Age of Onset  . Breast cancer Neg Hx     Social History Social History  Substance Use Topics  . Smoking status: Never Smoker  . Smokeless tobacco: Never Used  . Alcohol use No    Review of Systems Constitutional: No fever/chills Eyes: No visual changes. ENT: No sore throat. Cardiovascular: Denies chest pain. Respiratory: Positive for cough Gastrointestinal: Positive for nausea  Genitourinary: Negative for dysuria. Musculoskeletal: Negative for back pain. Skin: Negative for rash. Neurological: Positive for headache  ____________________________________________   PHYSICAL EXAM:  VITAL SIGNS: ED Triage Vitals [12/28/16 1455]  Enc Vitals Group     BP (!) 158/73     Pulse Rate 67     Resp 16     Temp 97.8 F (36.6 C)     Temp Source Oral     SpO2 100 %     Weight 189 lb (85.7 kg)     Height 5\' 5"  (1.651 m)     Head Circumference      Peak Flow      Pain Score 0   Constitutional: Alert and oriented. Well appearing and in no distress. Eyes: Conjunctivae are normal.  ENT   Head: Normocephalic and atraumatic.   Nose: No congestion/rhinnorhea.   Mouth/Throat: Mucous membranes are moist.   Neck: No stridor. Hematological/Lymphatic/Immunilogical: No cervical lymphadenopathy. Cardiovascular: Normal rate, regular rhythm.  No murmurs, rubs, or gallops.  Respiratory: Normal respiratory effort without tachypnea nor retractions. Breath sounds are clear and equal bilaterally. No wheezes/rales/rhonchi. Gastrointestinal: Soft and non tender. No rebound. No guarding.  Genitourinary: Deferred Musculoskeletal: Normal range of motion in all extremities. No lower extremity edema. Neurologic:  Normal speech and language. No gross focal neurologic deficits are appreciated.  Skin:  Skin is warm, dry and intact. No rash noted. Psychiatric: Mood and affect are normal. Speech and behavior are  normal. Patient exhibits appropriate insight and judgment.  ____________________________________________    LABS (pertinent positives/negatives)  Labs Reviewed  BASIC METABOLIC PANEL - Abnormal; Notable for the following:       Result Value   Sodium 127 (*)    Potassium 3.4 (*)    Chloride 91 (*)    Glucose, Bld 129 (*)    BUN 22 (*)    Creatinine, Ser 1.31 (*)    GFR calc non Af Amer 38 (*)    GFR calc Af Amer 44 (*)    All other components within normal limits  CBC - Abnormal; Notable for the following:    WBC 13.2 (*)    All other components within normal limits  TROPONIN I  URINALYSIS, COMPLETE (UACMP) WITH MICROSCOPIC  CBC  CREATININE, SERUM  BASIC METABOLIC PANEL  CBC  CBG MONITORING, ED     ____________________________________________   EKG  I, Nance Pear, attending physician,  personally viewed and interpreted this EKG  EKG Time: 1501 Rate: 66 Rhythm: normal sinus rhythm Axis: normal Intervals: qtc 444 QRS: narrow ST changes: no st elevation Impression: normal ekg   ____________________________________________    RADIOLOGY  CXR   IMPRESSION:  Bronchitic changes without infiltrate.    ___________________________________________   PROCEDURES  Procedures  ____________________________________________   INITIAL IMPRESSION / ASSESSMENT AND PLAN / ED COURSE  Pertinent labs & imaging results that were available during my care of the patient were reviewed by me and considered in my medical decision making (see chart for details).  Patient presented to the emergency department today after a syncopal episode. Patient's sodium was found to be 127. Will plan on admission.  ____________________________________________   FINAL CLINICAL IMPRESSION(S) / ED DIAGNOSES  Final diagnoses:  Syncope, unspecified syncope type  Hyponatremia     Note: This dictation was prepared with Dragon dictation. Any transcriptional errors that result  from this process are unintentional     Nance Pear, MD 12/28/16 [phone]

## 2016-12-28 NOTE — H&P (Signed)
[place] [facility] [place] NAME: Cynthia Mahoney    MR#:  [id number]  DATE OF BIRTH:  Jul 31, 1934  DATE OF ADMISSION:  12/28/2016  PRIMARY CARE PHYSICIAN: Derinda Late, MD   REQUESTING/REFERRING PHYSICIAN: Dr. Archie Balboa  CHIEF COMPLAINT: Syncope    Chief Complaint  Patient presents with  . Loss of Consciousness    HISTORY OF PRESENT ILLNESS:  Cynthia Mahoney  is a 82 y.o. female with a known history of Hypertension, glaucoma, had syncope in the car/as noted in the car, witnessed by granddaughter. Maybe she lost consciousness for a few seconds. She was in the hot car waiting for her garden granddaughter to come from work. She says that she had chicken at [facility] that she felt nauseous she felt hot and she passed out. Denies any chest pain. No shortness of breath. Recently treated with prednisone for the allergy symptoms with primary doctor. She still has cough. Denies dysuria. WBC up at 13.2. Found to have hyponatremia with sodium 127, potassium 3.4, creatinine 1.31. PAST MEDICAL HISTORY:   Past Medical History:  Diagnosis Date  . Anxiety   . Arthritis   . GERD (gastroesophageal reflux disease)   . Headache(784.0)    stress  . Hypertension   . Hypothyroidism   . PONV (postoperative nausea and vomiting)     PAST SURGICAL HISTOIRY:   Past Surgical History:  Procedure Laterality Date  . [place] Bilateral 2008, 2013   knee  . SHOULDER ARTHROSCOPY WITH OPEN ROTATOR CUFF REPAIR Right 04/2012  . TONSILLECTOMY      SOCIAL HISTORY:   Social History  Substance Use Topics  . Smoking status: Never Smoker  . Smokeless tobacco: Never Used  . Alcohol use No    FAMILY HISTORY:   Family History  Problem Relation Age of Onset  . Breast cancer Neg Hx     DRUG ALLERGIES:   Allergies  Allergen Reactions  . Amoxicillin Hives and Rash  . Erythromycin  Hives and Rash    REVIEW OF SYSTEMS:  CONSTITUTIONAL: Has positional vertigo sometimes.  EYES: No blurred or double vision.  EARS, NOSE, AND THROAT: No tinnitus or ear pain.  RESPIRATORY: No cough, shortness of breath, wheezing or hemoptysis.  CARDIOVASCULAR: No chest pain, orthopnea, edema.  GASTROINTESTINAL: No nausea, vomiting, diarrhea or abdominal pain.  GENITOURINARY: No dysuria, hematuria.  ENDOCRINE: No polyuria, nocturia,  HEMATOLOGY: No anemia, easy bruising or bleeding SKIN: No rash or lesion. MUSCULOSKELETAL: No joint pain or arthritis.   NEUROLOGIC: No tingling, numbness, weakness.  PSYCHIATRY:T anxious asking so many questions about hyponatremia  MEDICATIONS AT HOME:   Prior to Admission medications   Medication Sig Start Date End Date Taking? Authorizing Provider  aspirin 81 MG tablet Take 81 mg by mouth daily.   Yes [provider]  calcium carbonate (OS-CAL) 600 MG TABS tablet Take 600 mg by mouth daily.   Yes [provider]  dorzolamide-timolol (COSOPT) 22.3-6.8 MG/ML ophthalmic solution Place 1 drop into both eyes 2 (two) times daily.   Yes [provider]  Doxylamine Succinate, Sleep, (SLEEP AID PO) Take 1 tablet by mouth at bedtime.   Yes [provider]  gabapentin (NEURONTIN) 300 MG capsule Take 300-600 mg by mouth 2 (two) times daily. Takes 600 mg in the morning and 300 mg in the evening   Yes [provider]  HYDROcodone-acetaminophen (NORCO/VICODIN) 5-325 MG tablet Take  0.5 tablets by mouth every 4 (four) hours as needed.   Yes [provider]  latanoprost (XALATAN) 0.005 % ophthalmic solution Place 1 drop into both eyes at bedtime.   Yes [provider]  levothyroxine (SYNTHROID, LEVOTHROID) 88 MCG tablet Take 88 mcg by mouth daily before breakfast.   Yes [provider]  losartan-hydrochlorothiazide (HYZAAR) 50-12.5 MG tablet Take 1 tablet by mouth daily. 12/23/16  Yes [provider]  meloxicam (MOBIC) 7.5 MG tablet Take 1 tablet by mouth 2 (two) times daily. 12/23/16  Yes [provider]  metoprolol tartrate (LOPRESSOR) 25 MG tablet Take 25 mg by mouth 2 (two) times daily.   Yes [provider]  montelukast (SINGULAIR) 10 MG tablet Take 1 tablet by mouth daily. 12/02/16  Yes [provider]  Multiple Vitamin (MULTIVITAMIN WITH MINERALS) TABS tablet Take 1 tablet by mouth daily.   Yes [provider]  omeprazole (PRILOSEC) 20 MG capsule Take 20 mg by mouth daily.   Yes [provider]  pravastatin (PRAVACHOL) 40 MG tablet Take 40 mg by mouth at bedtime.    Yes [provider]  diazepam (VALIUM) 5 MG tablet Take 1 tablet (5 mg total) by mouth every 6 (six) hours as needed. Patient not taking: Reported on 12/28/2016 01/10/13   Newman Pies, MD  docusate sodium 100 MG CAPS Take 100 mg by mouth 2 (two) times daily. Patient not taking: Reported on 12/28/2016 01/10/13   Newman Pies, MD  magnesium oxide (MAG-OX) 400 MG tablet Take 400 mg by mouth daily.    [provider]  Multiple Vitamins-Minerals (VISION FORMULA/LUTEIN PO) Take 1 tablet by mouth daily.    [provider]  oxyCODONE-acetaminophen (PERCOCET) 10-325 MG per tablet Take 1 tablet by mouth every 4 (four) hours as needed for pain. Patient not taking: Reported on 12/28/2016 01/10/13   Newman Pies, MD  potassium chloride (K-DUR) 10 MEQ tablet Take 10 mEq by mouth daily.    [provider]  triamterene-hydrochlorothiazide (DYAZIDE) 37.5-25 MG per capsule Take 1 capsule by mouth every morning.    [provider]  zolpidem (AMBIEN) 5 MG tablet Take 5 mg by mouth at bedtime as needed for sleep. For sleep    [provider]      VITAL SIGNS:  Blood pressure (!) 158/73, pulse 67, temperature 97.8 F (36.6 C), temperature source Oral, resp. rate 16, height 5\' 5"  (1.651 m), weight 85.7 kg (189 lb), SpO2 100  %.  PHYSICAL EXAMINATION:  GENERAL:  82 y.o.-year-old patient lying in the bed with no acute distress.  EYES: Pupils equal, round, reactive to light and accommodation. No scleral icterus. Extraocular muscles intact.  HEENT: Head atraumatic, normocephalic. Oropharynx and nasopharynx clear.  NECK:  Supple, no jugular venous distention. No thyroid enlargement, no tenderness.  LUNGS: Normal breath sounds bilaterally, no wheezing, rales,rhonchi or crepitation. No use of accessory muscles of respiration.  CARDIOVASCULAR: S1, S2 normal. No murmurs, rubs, or gallops.  ABDOMEN: Soft, nontender, nondistended. Bowel sounds present. No organomegaly or mass.  EXTREMITIES: No pedal edema, cyanosis, or clubbing.  NEUROLOGIC: Cranial nerves II through XII are intact. Muscle strength 5/5 in all extremities. Sensation intact. Gait not checked.  PSYCHIATRIC: The patient is alert and oriented x 3.  SKIN: No obvious rash, lesion, or ulcer.   LABORATORY PANEL:   CBC  Recent Labs Lab 12/28/16 1454  WBC 13.2*  HGB 13.5  HCT 39.6  PLT 278   ------------------------------------------------------------------------------------------------------------------  Chemistries  Recent Labs Lab 12/28/16 1454  NA 127*  K 3.4*  CL 91*  CO2 26  GLUCOSE 129*  BUN 22*  CREATININE 1.31*  CALCIUM 9.1   ------------------------------------------------------------------------------------------------------------------  Cardiac Enzymes  Recent Labs Lab 12/28/16 1454  TROPONINI <0.03   ------------------------------------------------------------------------------------------------------------------  RADIOLOGY:  No results found.  EKG:   Orders placed or performed during the hospital encounter of 12/28/16  . ED EKG  . ED EKG  . EKG 12-Lead  . EKG 12-Lead    Sinus rhythm with 66 bpm no ST-T changes  IMPRESSION AND PLAN:   Syncope secondary to dehydration: Sodium 127. Admitted to telemetry, continue  gentle hydration, check orthostatic vitals, check sodium tomorrow. Also workup for syncope including ultrasound of carotids and echocardiogram ordered. #2. acute kidney injury with hyponatremia and hypokalemia. Hold her losartan and hydrochlorothiazide today. Monitor kidney function closely.  3, neuropathy in the legs and leg cramps: Continue medication, gabapentin.  #4 hypothyroidism: Continue L-thyroxine.  #5 . Essential hypertension: Continue metoprolol 25 MG twice a day but hold losartan and HCTZ because of acute kidney injury, hyponatremia and hypokalemia.  #6. leukocytosis secondary to recent prednisone use. Still has lots of cough. Patient uses Flonase. I ordered inhalers with albuterol. Chest x-ray is done but the report is pending ,hold off on antibiotics at this time.  l the records are reviewed and case discussed with ED provider. Management plans discussed with the patient, family and they are in agreement.  CODE STATUS:full  TOTAL TIME TAKING CARE OF THIS PATIENT: 95minutes.    Epifanio Lesches M.D on 12/28/2016 at 5:56 PM  Between 7am to 6pm - Pager - [phone]  After 6pm go to [email or web address] - password [email or web address] [facility]  Office  [phone]  CC: Primary care physician; Derinda Late, MD  Note: This dictation was prepared with Dragon dictation along with smaller phrase technology. Any transcriptional errors that result from this process are unintentional.

## 2016-12-28 NOTE — ED Triage Notes (Addendum)
Pt to ED via EMS, was found in car unconscious by granddaughter. Per EMS pt was unconscious upon arrival and aroused with stimuli. Pt states she remembered getting hot waiting in car and turned the car on to get [facility]. Pt denies any CP prior to event. CBG 166 per ems. Pt A&Ox4, VS stable at this time.

## 2016-12-29 DIAGNOSIS — E871 Hypo-osmolality and hyponatremia: Secondary | ICD-10-CM | POA: Diagnosis not present

## 2016-12-29 DIAGNOSIS — R55 Syncope and collapse: Secondary | ICD-10-CM | POA: Diagnosis not present

## 2016-12-29 DIAGNOSIS — N179 Acute kidney failure, unspecified: Secondary | ICD-10-CM | POA: Diagnosis not present

## 2016-12-29 LAB — BASIC METABOLIC PANEL
ANION GAP: 3 — AB (ref 5–15)
BUN: 22 mg/dL — AB (ref 6–20)
CALCIUM: 8.8 mg/dL — AB (ref 8.9–10.3)
CO2: 30 mmol/L (ref 22–32)
Chloride: 100 mmol/L — ABNORMAL LOW (ref 101–111)
Creatinine, Ser: 1.07 mg/dL — ABNORMAL HIGH (ref 0.44–1.00)
GFR calc Af Amer: 56 mL/min — ABNORMAL LOW (ref >60)
GFR, EST NON AFRICAN AMERICAN: 48 mL/min — AB (ref >60)
GLUCOSE: 98 mg/dL (ref 65–99)
POTASSIUM: 4.8 mmol/L (ref 3.5–5.1)
SODIUM: 133 mmol/L — AB (ref 135–145)

## 2016-12-29 LAB — CBC
HEMATOCRIT: 37.9 % (ref 35.0–47.0)
Hemoglobin: 12.7 g/dL (ref 12.0–16.0)
MCH: 31.9 pg (ref 26.0–34.0)
MCHC: 33.5 g/dL (ref 32.0–36.0)
MCV: 95.4 fL (ref 80.0–100.0)
Platelets: 245 10*3/uL (ref 150–440)
RBC: 3.97 MIL/uL (ref 3.80–5.20)
RDW: 13 % (ref 11.5–14.5)
WBC: 9.7 10*3/uL (ref 3.6–11.0)

## 2016-12-29 LAB — GLUCOSE, CAPILLARY: GLUCOSE-CAPILLARY: 91 mg/dL (ref 65–99)

## 2016-12-29 NOTE — Care Management Obs Status (Signed)
[place] NOTIFICATION   Patient Details  Name: SOFIA VANMETER MRN: [id number] Date of Birth: 1934/08/10   Medicare Observation Status Notification Given:  Yes Notice signed, one given to patient and the other to HIM for scanning   Katrina Stack, RN 12/29/2016, 2:45 PM

## 2016-12-29 NOTE — Progress Notes (Signed)
Pt to be discharged today. Has been up and ambulated and tolerated activity well. Iv and tele removed. disch instructions given to pt and daughter in law to their understanding. disch via w.c. Accompanied by family ;member.

## 2016-12-29 NOTE — Care Management Obs Status (Signed)
[place] NOTIFICATION   Patient Details  Name: Cynthia Mahoney MRN: [id number] Date of Birth: 1934-10-30   Medicare Observation Status Notification Given:  Yes Notice signed, one given to patient and the other to HIM for scanning   Katrina Stack, RN 12/29/2016, 2:49 PM

## 2016-12-29 NOTE — Care Management Obs Status (Signed)
[place] NOTIFICATION   Patient Details  Name: Cynthia Mahoney MRN: [id number] Date of Birth: 1934/07/29   Medicare Observation Status Notification Given:  Yes Notice signed, one given to patient and the other to HIM for scanning   Katrina Stack, RN 12/29/2016, 2:47 PM

## 2016-12-29 NOTE — Discharge Instructions (Signed)
Near-Syncope Near-syncope is when you suddenly get weak or dizzy, or you feel like you might pass out (faint). During an episode of near-syncope, you may:  Feel dizzy or light-headed.  Feel sick to your stomach (nauseous).  See all white or all black.  Have cold, clammy skin.  If you passed out, get help right away.Call your local emergency services (911 in the [place] Do not drive yourself to the hospital. Follow these instructions at home: Pay attention to any changes in your symptoms. Take these actions to help with your condition:  Have someone stay with you until you feel stable.  Do not drive, use machinery, or play sports until your doctor says it is okay.  Keep all follow-up visits as told by your doctor. This is important.  If you start to feel like you might pass out, lie down right away and raise (elevate) your feet above the level of your heart. Breathe deeply and steadily. Wait until all of the symptoms are gone.  Drink enough fluid to keep your pee (urine) clear or pale yellow.  If you are taking blood pressure or heart medicine, get up slowly and spend many minutes getting ready to sit and then stand. This can help with dizziness.  Take over-the-counter and prescription medicines only as told by your doctor.  Get help right away if:  You have a very bad headache.  You have unusual pain in your chest, tummy, or back.  You are bleeding from your mouth or rectum.  You have black or tarry poop (stool).  You have a very fast or uneven heartbeat (palpitations).  You pass out one time or more than once.  You have jerky movements that you cannot control (seizure).  You are confused.  You have trouble walking.  You are very weak.  You have vision problems. These symptoms may be an emergency. Do not wait to see if the symptoms will go away. Get medical help right away. Call your local emergency services (911 in the [place] Do not drive yourself to the  hospital. This information is not intended to replace advice given to you by your health care provider. Make sure you discuss any questions you have with your health care provider. Document Released: 10/18/2007 Document Revised: 10/07/2015 Document Reviewed: 01/13/2015 Elsevier Interactive Patient Education  2017 [employer].    Hyponatremia Hyponatremia is when the amount of salt (sodium) in your blood is too low. When salt levels are low, your cells absorb extra water and they swell. The swelling happens throughout the body, but it mostly affects the brain. Follow these instructions at home:  Take medicines only as told by your doctor. Many medicines can make this condition worse. Talk with your doctor about any medicines that you are currently taking.  Carefully follow a recommended diet as told by your doctor.  Carefully follow instructions from your doctor about fluid restrictions.  Keep all follow-up visits as told by your doctor. This is important.  Do not drink alcohol. Contact a doctor if:  You feel sicker to your stomach (nauseous).  You feel more confused.  You feel more tired (fatigued).  Your headache gets worse.  You feel weaker.  Your symptoms go away and then they come back.  You have trouble following the diet instructions. Get help right away if:  You start to twitch and shake (have a seizure).  You pass out (faint).  You keep having watery poop (diarrhea).  You keep throwing up (vomiting). This  information is not intended to replace advice given to you by your health care provider. Make sure you discuss any questions you have with your health care provider. Document Released: 01/11/2011 Document Revised: 10/07/2015 Document Reviewed: 04/27/2014 Elsevier Interactive Patient Education  [employer].

## 2016-12-29 NOTE — Care Management CC44 (Signed)
Condition Code 44 Documentation Completed  Patient Details  Name: TANISHKA DROLET MRN: [id number] Date of Birth: April 26, 1935   Condition Code 44 given:  Yes Patient signature on Condition Code 44 notice:  Yes Documentation of 2 MD's agreement:  Yes Code 44 added to claim:  Yes    Katrina Stack, RN 12/29/2016, 2:49 PM

## 2016-12-30 NOTE — Discharge Summary (Signed)
Cynthia Mahoney at [place] NAME: Cynthia Mahoney    MR#:  [id number]  DATE OF BIRTH:  Dec 23, 1934  DATE OF ADMISSION:  12/28/2016   ADMITTING PHYSICIAN: Epifanio Lesches, MD  DATE OF DISCHARGE: 12/29/2016  2:40 PM  PRIMARY CARE PHYSICIAN: Derinda Late, MD   ADMISSION DIAGNOSIS:  Hyponatremia [E87.1] Syncope [R55] Syncope, unspecified syncope type [R55] DISCHARGE DIAGNOSIS:  Active Problems:   Syncope  SECONDARY DIAGNOSIS:   Past Medical History:  Diagnosis Date  . Anxiety   . Arthritis   . GERD (gastroesophageal reflux disease)   . Headache(784.0)    stress  . Hypertension   . Hypothyroidism   . PONV (postoperative nausea and vomiting)    HOSPITAL COURSE:  82 y.o. female with a known history of Hypertension, glaucoma, had syncope in the car as noted in the car, witnessed by granddaughter. Maybe she lost consciousness for a few seconds. She was in the hot car waiting for her garden granddaughter to come from work. She says that she had chicken at [facility] that she felt nauseous she felt hot and she passed out. Denies any chest pain. No shortness of breath. Recently treated with prednisone for the allergy symptoms with primary doctor. WBC up at 13.2. Found to have hyponatremia with sodium 127, potassium 3.4, creatinine 1.31  * Vaso vagal Syncope secondary to dehydration: Sodium 127->133 with hydration  * Acute kidney injury with hyponatremia and hypokalemia - resolved with hydration. - Creat 1.31-> 1.07  * Hyponatremia: Sodium 127->133 with hydration  Wanting to go home and very adamant for no further w/up. DISCHARGE CONDITIONS:  stable CONSULTS OBTAINED:   DRUG ALLERGIES:   Allergies  Allergen Reactions  . Amoxicillin Hives and Rash  . Erythromycin Hives and Rash   DISCHARGE MEDICATIONS:   Allergies as of 12/29/2016      Reactions   Amoxicillin Hives, Rash   Erythromycin Hives, Rash        Medication List    STOP taking these medications   diazepam 5 MG tablet Commonly known as:  VALIUM   HYDROcodone-acetaminophen 5-325 MG tablet Commonly known as:  NORCO/VICODIN   oxyCODONE-acetaminophen 10-325 MG tablet Commonly known as:  PERCOCET   zolpidem 5 MG tablet Commonly known as:  AMBIEN     TAKE these medications   aspirin 81 MG tablet Take 81 mg by mouth daily.   calcium carbonate 600 MG Tabs tablet Commonly known as:  OS-CAL Take 600 mg by mouth daily.   dorzolamide-timolol 22.3-6.8 MG/ML ophthalmic solution Commonly known as:  COSOPT Place 1 drop into both eyes 2 (two) times daily.   DSS 100 MG Caps Take 100 mg by mouth 2 (two) times daily.   gabapentin 300 MG capsule Commonly known as:  NEURONTIN Take 300-600 mg by mouth 2 (two) times daily. Takes 600 mg in the morning and 300 mg in the evening   latanoprost 0.005 % ophthalmic solution Commonly known as:  XALATAN Place 1 drop into both eyes at bedtime.   levothyroxine 88 MCG tablet Commonly known as:  SYNTHROID, LEVOTHROID Take 88 mcg by mouth daily before breakfast.   losartan-hydrochlorothiazide 50-12.5 MG tablet Commonly known as:  HYZAAR Take 1 tablet by mouth daily.   magnesium oxide 400 MG tablet Commonly known as:  MAG-OX Take 400 mg by mouth daily.   meloxicam 7.5 MG tablet Commonly known as:  MOBIC Take 1 tablet by mouth 2 (two) times daily.   metoprolol tartrate 25  MG tablet Commonly known as:  LOPRESSOR Take 25 mg by mouth 2 (two) times daily.   montelukast 10 MG tablet Commonly known as:  SINGULAIR Take 1 tablet by mouth daily.   multivitamin with minerals Tabs tablet Take 1 tablet by mouth daily.   omeprazole 20 MG capsule Commonly known as:  PRILOSEC Take 20 mg by mouth daily.   potassium chloride 10 MEQ tablet Commonly known as:  K-DUR Take 10 mEq by mouth daily.   pravastatin 40 MG tablet Commonly known as:  PRAVACHOL Take 40 mg by mouth at bedtime.   SLEEP  AID PO Take 1 tablet by mouth at bedtime.   triamterene-hydrochlorothiazide 37.5-25 MG capsule Commonly known as:  DYAZIDE Take 1 capsule by mouth every morning.   VISION FORMULA/LUTEIN PO Take 1 tablet by mouth daily.        DISCHARGE INSTRUCTIONS:   DIET:  Regular diet DISCHARGE CONDITION:  Good ACTIVITY:  Activity as tolerated OXYGEN:  Home Oxygen: No.  Oxygen Delivery: room air DISCHARGE LOCATION:  home   If you experience worsening of your admission symptoms, develop shortness of breath, life threatening emergency, suicidal or homicidal thoughts you must seek medical attention immediately by calling 911 or calling your MD immediately  if symptoms less severe.  You Must read complete instructions/literature along with all the possible adverse reactions/side effects for all the Medicines you take and that have been prescribed to you. Take any new Medicines after you have completely understood and accpet all the possible adverse reactions/side effects.   Please note  You were cared for by a hospitalist during your hospital stay. If you have any questions about your discharge medications or the care you received while you were in the hospital after you are discharged, you can call the unit and asked to speak with the hospitalist on call if the hospitalist that took care of you is not available. Once you are discharged, your primary care physician will handle any further medical issues. Please note that NO REFILLS for any discharge medications will be authorized once you are discharged, as it is imperative that you return to your primary care physician (or establish a relationship with a primary care physician if you do not have one) for your aftercare needs so that they can reassess your need for medications and monitor your lab values.    On the day of Discharge:  VITAL SIGNS:  Blood pressure (!) 162/77, pulse 72, temperature 98 F (36.7 C), temperature source Oral, resp.  rate 16, height 5\' 5"  (1.651 m), weight 87.1 kg (192 lb), SpO2 99 %. PHYSICAL EXAMINATION:  GENERAL:  82 y.o.-year-old patient lying in the bed with no acute distress.  EYES: Pupils equal, round, reactive to light and accommodation. No scleral icterus. Extraocular muscles intact.  HEENT: Head atraumatic, normocephalic. Oropharynx and nasopharynx clear.  NECK:  Supple, no jugular venous distention. No thyroid enlargement, no tenderness.  LUNGS: Normal breath sounds bilaterally, no wheezing, rales,rhonchi or crepitation. No use of accessory muscles of respiration.  CARDIOVASCULAR: S1, S2 normal. No murmurs, rubs, or gallops.  ABDOMEN: Soft, non-tender, non-distended. Bowel sounds present. No organomegaly or mass.  EXTREMITIES: No pedal edema, cyanosis, or clubbing.  NEUROLOGIC: Cranial nerves II through XII are intact. Muscle strength 5/5 in all extremities. Sensation intact. Gait not checked.  PSYCHIATRIC: The patient is alert and oriented x 3.  SKIN: No obvious rash, lesion, or ulcer.  DATA REVIEW:   CBC  Recent Labs Lab 12/29/16 0410  WBC  9.7  HGB 12.7  HCT 37.9  PLT 245    Chemistries   Recent Labs Lab 12/29/16 0410  NA 133*  K 4.8  CL 100*  CO2 30  GLUCOSE 98  BUN 22*  CREATININE 1.07*  CALCIUM 8.8*     Microbiology Results  Results for orders placed or performed during the hospital encounter of 01/06/13  Surgical pcr screen     Status: Abnormal   Collection Time: 01/06/13  2:18 PM  Result Value Ref Range Status   MRSA, PCR POSITIVE (A) NEGATIVE Final   Staphylococcus aureus POSITIVE (A) NEGATIVE Final    Comment:        The Xpert SA Assay (FDA approved for NASAL specimens in patients over 45 years of age), is one component of a comprehensive surveillance program.  Test performance has been validated by [employer] for patients greater than or equal to 60 year old. It is not intended to diagnose infection nor to guide or monitor treatment.     Follow-up Information    Derinda Late, MD. Schedule an appointment as soon as possible for a visit in 1 week.   Specialty:  Family Medicine Why:  Please call the offic to schedule this appointment. Thanks! Contact information: [address] [facility] [place] [address] [phone]          Management plans discussed with the patient, family and they are in agreement.  CODE STATUS: Prior   TOTAL TIME TAKING CARE OF THIS PATIENT: 45 minutes.    Max Sane M.D on 12/30/2016 at 5:16 PM  Between 7am to 6pm - Pager - [phone]  After 6pm go to [email or web address] - Proofreader  [facility]  Office  [phone]  CC: Primary care physician; Derinda Late, MD   Note: This dictation was prepared with Dragon dictation along with smaller phrase technology. Any transcriptional errors that result from this process are unintentional.

## 2017-01-02 DIAGNOSIS — Z6832 Body mass index (BMI) 32.0-32.9, adult: Secondary | ICD-10-CM | POA: Diagnosis not present

## 2017-01-02 DIAGNOSIS — M545 Low back pain: Secondary | ICD-10-CM | POA: Diagnosis not present

## 2017-01-02 DIAGNOSIS — I10 Essential (primary) hypertension: Secondary | ICD-10-CM | POA: Diagnosis not present

## 2017-01-03 DIAGNOSIS — Z09 Encounter for follow-up examination after completed treatment for conditions other than malignant neoplasm: Secondary | ICD-10-CM | POA: Diagnosis not present

## 2017-01-03 DIAGNOSIS — N179 Acute kidney failure, unspecified: Secondary | ICD-10-CM | POA: Diagnosis not present

## 2017-01-03 DIAGNOSIS — R55 Syncope and collapse: Secondary | ICD-10-CM | POA: Diagnosis not present

## 2017-01-03 DIAGNOSIS — J209 Acute bronchitis, unspecified: Secondary | ICD-10-CM | POA: Diagnosis not present

## 2017-01-03 DIAGNOSIS — E871 Hypo-osmolality and hyponatremia: Secondary | ICD-10-CM | POA: Diagnosis not present

## 2017-01-08 DIAGNOSIS — J209 Acute bronchitis, unspecified: Secondary | ICD-10-CM | POA: Diagnosis not present

## 2017-01-11 DIAGNOSIS — Z0100 Encounter for examination of eyes and vision without abnormal findings: Secondary | ICD-10-CM | POA: Diagnosis not present

## 2017-03-09 ENCOUNTER — Encounter: Payer: Self-pay | Admitting: Emergency Medicine

## 2017-03-09 ENCOUNTER — Emergency Department
Admission: EM | Admit: 2017-03-09 | Discharge: 2017-03-09 | Disposition: A | Discharge status: Home / Self Care | Payer: Medicare HMO | Attending: Emergency Medicine | Admitting: Emergency Medicine

## 2017-03-09 DIAGNOSIS — Z96653 Presence of artificial knee joint, bilateral: Secondary | ICD-10-CM | POA: Insufficient documentation

## 2017-03-09 DIAGNOSIS — M549 Dorsalgia, unspecified: Secondary | ICD-10-CM | POA: Diagnosis present

## 2017-03-09 DIAGNOSIS — Z79899 Other long term (current) drug therapy: Secondary | ICD-10-CM | POA: Diagnosis not present

## 2017-03-09 DIAGNOSIS — I10 Essential (primary) hypertension: Secondary | ICD-10-CM | POA: Diagnosis not present

## 2017-03-09 DIAGNOSIS — G8929 Other chronic pain: Secondary | ICD-10-CM | POA: Insufficient documentation

## 2017-03-09 DIAGNOSIS — M545 Low back pain: Secondary | ICD-10-CM | POA: Diagnosis not present

## 2017-03-09 DIAGNOSIS — N390 Urinary tract infection, site not specified: Secondary | ICD-10-CM | POA: Insufficient documentation

## 2017-03-09 DIAGNOSIS — E039 Hypothyroidism, unspecified: Secondary | ICD-10-CM | POA: Insufficient documentation

## 2017-03-09 DIAGNOSIS — Z7982 Long term (current) use of aspirin: Secondary | ICD-10-CM | POA: Diagnosis not present

## 2017-03-09 LAB — URINALYSIS, COMPLETE (UACMP) WITH MICROSCOPIC
BACTERIA UA: NONE SEEN
BILIRUBIN URINE: NEGATIVE
Glucose, UA: NEGATIVE mg/dL
HGB URINE DIPSTICK: NEGATIVE
KETONES UR: NEGATIVE mg/dL
NITRITE: NEGATIVE
Protein, ur: NEGATIVE mg/dL
Specific Gravity, Urine: 1.014 (ref 1.005–1.030)
pH: 5 (ref 5.0–8.0)

## 2017-03-09 MED ORDER — CIPROFLOXACIN HCL 500 MG PO TABS: 500.0000 mg | ORAL_TABLET | Freq: Two times a day (BID) | ORAL | 0 refills | Status: DC

## 2017-03-09 MED ORDER — DIAZEPAM 5 MG PO TABS
5.0000 mg | ORAL_TABLET | Freq: Once | ORAL | Status: AC
  Administered 2017-03-09: 5 mg via ORAL
  Filled 2017-03-09: qty 1

## 2017-03-09 MED ORDER — DIAZEPAM 2 MG PO TABS: 2.0000 mg | ORAL_TABLET | Freq: Three times a day (TID) | ORAL | 0 refills | Status: AC | PRN

## 2017-03-09 MED ORDER — DIAZEPAM 2 MG PO TABS: 2.0000 mg | ORAL_TABLET | Freq: Three times a day (TID) | ORAL | 0 refills | Status: DC | PRN

## 2017-03-09 NOTE — ED Triage Notes (Signed)
Pt states that she has been having back pain x 1 week. This morning pain is worse, hurts worse with movement. Pt in NAD at this time.

## 2017-03-09 NOTE — ED Provider Notes (Signed)
[facility] Emergency Department Provider Note  ____________________________________________   First MD Initiated Contact with Patient 03/09/17 0831     (approximate)  I have reviewed the triage vital signs and the nursing notes.   HISTORY  Chief Complaint Back Pain   HPI Cynthia Mahoney is a 82 y.o. female dyspnea complaint of back pain for one week. Patient states that this morning back pain is worse especially with movement. Patient has history of back problems and has had surgery on her back.patient has a prescription for hydrocodone which she took this morning at approximately 7 AM. 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 Patient also has continued to do household chores and also another ER. Patient denies any paresthesias, incontinence of bowel or bladder, or saddle anesthesias. Patient describes this as a grabbing sensation that keeps her from moving especially when she is trying to twist or bend.  She denies any urinary symptoms or history of kidney stones. Currently she rates her pain as a 10 over 10.   Past Medical History:  Diagnosis Date  . Anxiety   . Arthritis   . GERD (gastroesophageal reflux disease)   . Headache(784.0)    stress  . Hypertension   . Hypothyroidism   . PONV (postoperative nausea and vomiting)     Patient Active Problem List   Diagnosis Date Noted  . Syncope 12/28/2016    Past Surgical History:  Procedure Laterality Date  . [place] Bilateral 2008, 2013   knee  . SHOULDER ARTHROSCOPY WITH OPEN ROTATOR CUFF REPAIR Right 04/2012  . TONSILLECTOMY      Prior to Admission medications   Medication Sig Start Date End Date Taking? Authorizing Provider  aspirin 81 MG tablet Take 81 mg by mouth daily.    [provider]  calcium carbonate (OS-CAL) 600  MG TABS tablet Take 600 mg by mouth daily.    [provider]  ciprofloxacin (CIPRO) 500 MG tablet Take 1 tablet (500 mg total) by mouth 2 (two) times daily. 03/09/17   Johnn Hai, PA-C  diazepam (VALIUM) 2 MG tablet Take 1 tablet (2 mg total) by mouth every 8 (eight) hours as needed for muscle spasms. 03/09/17 03/09/18  Johnn Hai, PA-C  docusate sodium 100 MG CAPS Take 100 mg by mouth 2 (two) times daily. Patient not taking: Reported on 12/28/2016 01/10/13   Newman Pies, MD  dorzolamide-timolol (COSOPT) 22.3-6.8 MG/ML ophthalmic solution Place 1 drop into both eyes 2 (two) times daily.    [provider]  Doxylamine Succinate, Sleep, (SLEEP AID PO) Take 1 tablet by mouth at bedtime.    [provider]  gabapentin (NEURONTIN) 300 MG capsule Take 300-600 mg by mouth 2 (two) times daily. Takes 600 mg in the morning and 300 mg in the evening    [provider]  latanoprost (XALATAN) 0.005 % ophthalmic solution Place 1 drop into both eyes at bedtime.    [provider]  levothyroxine (SYNTHROID, LEVOTHROID) 88 MCG tablet Take 88 mcg by mouth daily before breakfast.    [provider]  losartan-hydrochlorothiazide (HYZAAR) 50-12.5 MG tablet Take 1 tablet by mouth daily. 12/23/16   [provider]  magnesium oxide (MAG-OX) 400 MG tablet Take 400 mg by mouth daily.    [provider]  meloxicam [facility])  7.5 MG tablet Take 1 tablet by mouth 2 (two) times daily. 12/23/16   [provider]  metoprolol tartrate (LOPRESSOR) 25 MG tablet Take 25 mg by mouth 2 (two) times daily.    [provider]  montelukast (SINGULAIR) 10 MG tablet Take 1 tablet by mouth daily. 12/02/16   [provider]  Multiple Vitamin (MULTIVITAMIN WITH MINERALS) TABS tablet Take 1 tablet by mouth daily.    [provider]  Multiple Vitamins-Minerals (VISION FORMULA/LUTEIN PO) Take 1 tablet by mouth daily.    [provider]  omeprazole (PRILOSEC) 20 MG capsule Take 20 mg by mouth daily.    [provider]  potassium chloride (K-DUR) 10 MEQ tablet Take 10 mEq by mouth daily.    [provider]  pravastatin (PRAVACHOL) 40 MG tablet Take 40 mg by mouth at bedtime.     [provider]  triamterene-hydrochlorothiazide (DYAZIDE) 37.5-25 MG per capsule Take 1 capsule by mouth every morning.    [provider]    Allergies Amoxicillin and Erythromycin  Family History  Problem Relation Age of Onset  . Breast cancer Neg Hx     Social History Social History  Substance Use Topics  . Smoking status: Never Smoker  . Smokeless tobacco: Never Used  . Alcohol use No    Review of Systems Constitutional: No fever/chills Cardiovascular: Denies chest pain. Respiratory: Denies shortness of breath. Gastrointestinal: No abdominal pain.  No nausea, no vomiting.  No diarrhea.  No constipation. Genitourinary: Negative for dysuria. Musculoskeletal: positive for acute and chronic back pain. Skin: Negative for rash. Neurological: Negative for headaches, focal weakness or numbness. ___________________________________________   PHYSICAL EXAM:  VITAL SIGNS: ED Triage Vitals  Enc Vitals Group     BP 03/09/17 0829 (!) 121/94     Pulse Rate 03/09/17 0829 78     Resp 03/09/17 0829 20     Temp 03/09/17 0830 97.7 F (36.5 C)     Temp Source 03/09/17 0830 Oral     SpO2 03/09/17 0829 97 %     Weight 03/09/17 0823 192 lb (87.1 kg)     Height 03/09/17 0823 5\' 1"  (1.549 m)     Head Circumference --      Peak Flow --      Pain Score 03/09/17 0823 10     Pain Loc --      Pain Edu? --      Excl. in [place]? --    Constitutional: Alert and oriented. Well appearing and in no acute distress. Eyes: Conjunctivae are normal.  Head: Atraumatic. Nose: No congestion/rhinnorhea. Neck: No stridor.   Cardiovascular: Normal rate, regular rhythm. Grossly normal heart sounds.  Good  peripheral circulation. Respiratory: Normal respiratory effort.  No retractions. Lungs CTAB. Gastrointestinal: Soft and nontender. No distention. No abdominal bruits. No CVA tenderness. Musculoskeletal: examination of the back there is a well-healed surgical scar lower lumbar area. There is no point tenderness on palpation spinous processes. There is moderate tenderness on palpation of the paravertebral muscles. Patient is restricted in range of motion secondary to her pain. Patient appears to have muscle spasms and is restricted. Patient needed assistance to sit up for examination. Straight leg raises bilaterally increased her pain and was uncomfortable. Good muscle strength bilaterally. Neurologic:  Normal speech and language. No gross focal neurologic deficits are appreciated. Reflexes 1+ bilaterally. Skin:  Skin is warm, dry and intact.  Psychiatric: Mood and affect are normal. Speech and behavior are normal.  ____________________________________________  LABS (all labs ordered are listed, but only abnormal results are displayed)  Labs Reviewed  URINALYSIS, COMPLETE (UACMP) WITH MICROSCOPIC - Abnormal; Notable for the following:       Result Value   Color, Urine YELLOW (*)    APPearance HAZY (*)    Leukocytes, UA LARGE (*)    Squamous Epithelial / LPF 0-5 (*)    Non Squamous Epithelial 0-5 (*)    All other components within normal limits  URINE CULTURE     PROCEDURES  Procedure(s) performed: None  Procedures  Critical Care performed: No  ____________________________________________   INITIAL IMPRESSION / ASSESSMENT AND PLAN / ED COURSE Patient was given diazepam 5 mg while in the department which seemed to help with a lot of her muscle spasms. Patient had already taken Norco prior to her arrival in the department. Urinalysis showed that she did have a UTI and this was discussed with both she and the family. Patient will follow up with her PCP next week. She will continue  taking Norco as instructed by her doctor for pain. Prescription for diazepam 2 mg 1 every 8 hours as needed for muscle spasms and intermittent. Patient is whether this medication could cause drowsiness increase her risk for falling. Patient and family are aware to return to the emergency room for severe worsening of her symptoms. Patient was discharged with a prescription for Cipro 500 mg twice a day. Urine culture was obtained.   ___________________________________________   FINAL CLINICAL IMPRESSION(S) / ED DIAGNOSES  Final diagnoses:  Urinary tract infection, acute  Acute exacerbation of chronic low back pain      NEW MEDICATIONS STARTED DURING THIS VISIT:  Discharge Medication List as of 03/09/2017 10:38 AM       Note:  This document was prepared using Dragon voice recognition software and may include unintentional dictation errors.    Johnn Hai, PA-C 03/09/17 [place], [place], MD 03/13/17 [phone]

## 2017-03-09 NOTE — Discharge Instructions (Signed)
Follow-up with her primary care doctor next week if any continued problems. Begin taking Cipro 500 mg twice a day for urinary tract infection. Continue your hydrocodone as prescribed by your doctor for back pain. Use ice or heat to her back as needed for discomfort. Also diazepam 2 mg 1 every 8 hours as needed for muscle spasms. This medication could cause drowsiness increase your  risk for falling. Be very careful while taking this medication. Return to emergency room if any severe worsening of your symptoms.

## 2017-03-10 LAB — URINE CULTURE: SPECIAL REQUESTS: NORMAL

## 2017-05-01 DIAGNOSIS — E7800 Pure hypercholesterolemia, unspecified: Secondary | ICD-10-CM | POA: Diagnosis not present

## 2017-05-01 DIAGNOSIS — Z79899 Other long term (current) drug therapy: Secondary | ICD-10-CM | POA: Diagnosis not present

## 2017-05-01 DIAGNOSIS — E039 Hypothyroidism, unspecified: Secondary | ICD-10-CM | POA: Diagnosis not present

## 2017-05-23 DIAGNOSIS — J019 Acute sinusitis, unspecified: Secondary | ICD-10-CM | POA: Diagnosis not present

## 2017-05-23 DIAGNOSIS — R05 Cough: Secondary | ICD-10-CM | POA: Diagnosis not present

## 2017-05-23 DIAGNOSIS — B9689 Other specified bacterial agents as the cause of diseases classified elsewhere: Secondary | ICD-10-CM | POA: Diagnosis not present

## 2017-05-23 DIAGNOSIS — J209 Acute bronchitis, unspecified: Secondary | ICD-10-CM | POA: Diagnosis not present

## 2017-05-31 DIAGNOSIS — I10 Essential (primary) hypertension: Secondary | ICD-10-CM | POA: Diagnosis not present

## 2017-05-31 DIAGNOSIS — Z79899 Other long term (current) drug therapy: Secondary | ICD-10-CM | POA: Diagnosis not present

## 2017-05-31 DIAGNOSIS — E039 Hypothyroidism, unspecified: Secondary | ICD-10-CM | POA: Diagnosis not present

## 2017-05-31 DIAGNOSIS — K219 Gastro-esophageal reflux disease without esophagitis: Secondary | ICD-10-CM | POA: Diagnosis not present

## 2017-05-31 DIAGNOSIS — J309 Allergic rhinitis, unspecified: Secondary | ICD-10-CM | POA: Diagnosis not present

## 2017-05-31 DIAGNOSIS — F419 Anxiety disorder, unspecified: Secondary | ICD-10-CM | POA: Diagnosis not present

## 2017-05-31 DIAGNOSIS — E7800 Pure hypercholesterolemia, unspecified: Secondary | ICD-10-CM | POA: Diagnosis not present

## 2017-06-26 DIAGNOSIS — H402233 Chronic angle-closure glaucoma, bilateral, severe stage: Secondary | ICD-10-CM | POA: Diagnosis not present

## 2017-07-09 DIAGNOSIS — H402223 Chronic angle-closure glaucoma, left eye, severe stage: Secondary | ICD-10-CM | POA: Diagnosis not present

## 2017-07-17 DIAGNOSIS — M545 Low back pain: Secondary | ICD-10-CM | POA: Diagnosis not present

## 2017-07-17 DIAGNOSIS — I10 Essential (primary) hypertension: Secondary | ICD-10-CM | POA: Diagnosis not present

## 2017-07-17 DIAGNOSIS — Z6832 Body mass index (BMI) 32.0-32.9, adult: Secondary | ICD-10-CM | POA: Diagnosis not present

## 2017-08-22 DIAGNOSIS — L578 Other skin changes due to chronic exposure to nonionizing radiation: Secondary | ICD-10-CM | POA: Diagnosis not present

## 2017-08-22 DIAGNOSIS — L812 Freckles: Secondary | ICD-10-CM | POA: Diagnosis not present

## 2017-08-22 DIAGNOSIS — L820 Inflamed seborrheic keratosis: Secondary | ICD-10-CM | POA: Diagnosis not present

## 2017-08-22 DIAGNOSIS — L821 Other seborrheic keratosis: Secondary | ICD-10-CM | POA: Diagnosis not present

## 2017-08-22 DIAGNOSIS — L570 Actinic keratosis: Secondary | ICD-10-CM | POA: Diagnosis not present

## 2017-08-23 DIAGNOSIS — J0100 Acute maxillary sinusitis, unspecified: Secondary | ICD-10-CM | POA: Diagnosis not present

## 2017-11-26 DIAGNOSIS — L720 Epidermal cyst: Secondary | ICD-10-CM | POA: Diagnosis not present

## 2017-11-26 DIAGNOSIS — L820 Inflamed seborrheic keratosis: Secondary | ICD-10-CM | POA: Diagnosis not present

## 2017-11-26 DIAGNOSIS — L578 Other skin changes due to chronic exposure to nonionizing radiation: Secondary | ICD-10-CM | POA: Diagnosis not present

## 2017-11-26 DIAGNOSIS — L570 Actinic keratosis: Secondary | ICD-10-CM | POA: Diagnosis not present

## 2017-11-26 DIAGNOSIS — L821 Other seborrheic keratosis: Secondary | ICD-10-CM | POA: Diagnosis not present

## 2017-11-27 DIAGNOSIS — E039 Hypothyroidism, unspecified: Secondary | ICD-10-CM | POA: Diagnosis not present

## 2017-11-27 DIAGNOSIS — E7800 Pure hypercholesterolemia, unspecified: Secondary | ICD-10-CM | POA: Diagnosis not present

## 2017-11-27 DIAGNOSIS — Z79899 Other long term (current) drug therapy: Secondary | ICD-10-CM | POA: Diagnosis not present

## 2017-12-04 ENCOUNTER — Other Ambulatory Visit: Payer: Self-pay | Admitting: Family Medicine

## 2017-12-04 DIAGNOSIS — Z1231 Encounter for screening mammogram for malignant neoplasm of breast: Secondary | ICD-10-CM

## 2017-12-04 DIAGNOSIS — Z0000 Encounter for general adult medical examination without abnormal findings: Secondary | ICD-10-CM | POA: Diagnosis not present

## 2017-12-25 ENCOUNTER — Ambulatory Visit
Admission: RE | Admit: 2017-12-25 | Discharge: 2017-12-25 | Disposition: A | Discharge status: Home / Self Care | Payer: Medicare HMO | Source: Ambulatory Visit | Attending: Family Medicine | Admitting: Family Medicine

## 2017-12-25 DIAGNOSIS — Z1231 Encounter for screening mammogram for malignant neoplasm of breast: Secondary | ICD-10-CM | POA: Diagnosis not present

## 2018-01-09 DIAGNOSIS — H402233 Chronic angle-closure glaucoma, bilateral, severe stage: Secondary | ICD-10-CM | POA: Diagnosis not present

## 2018-04-21 IMAGING — CR DG CHEST 2V
1 series · 2 of 2 positions shown · non-contrast
Comparison: None

CLINICAL DATA: Found unconscious by granddaughter, history
hypertension

EXAM:
CHEST  2 VIEW

[Series 1: dg chest 2 view · 0.14mm/px · 2 of 2 slices shown]
[im 1/2]
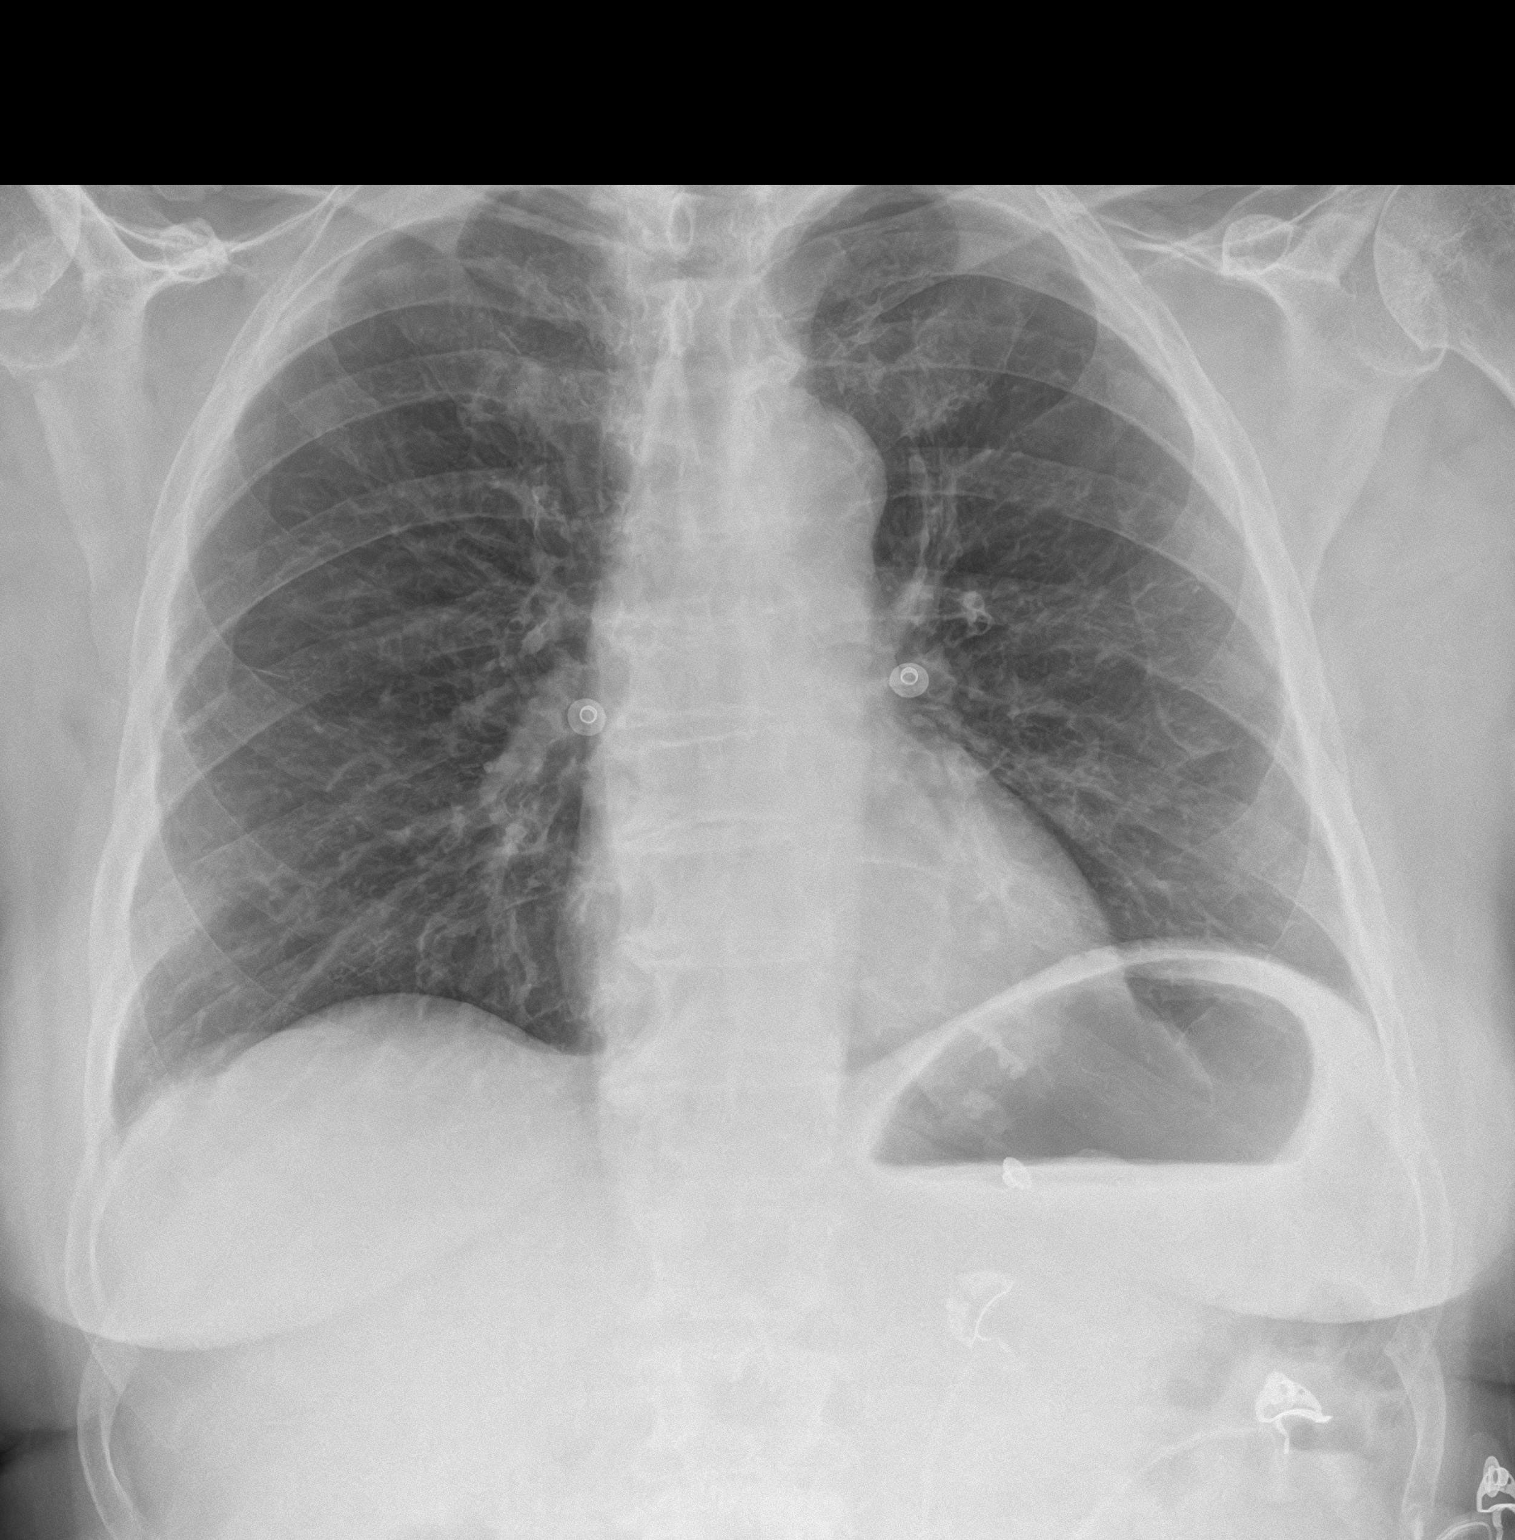
[im 2/2]
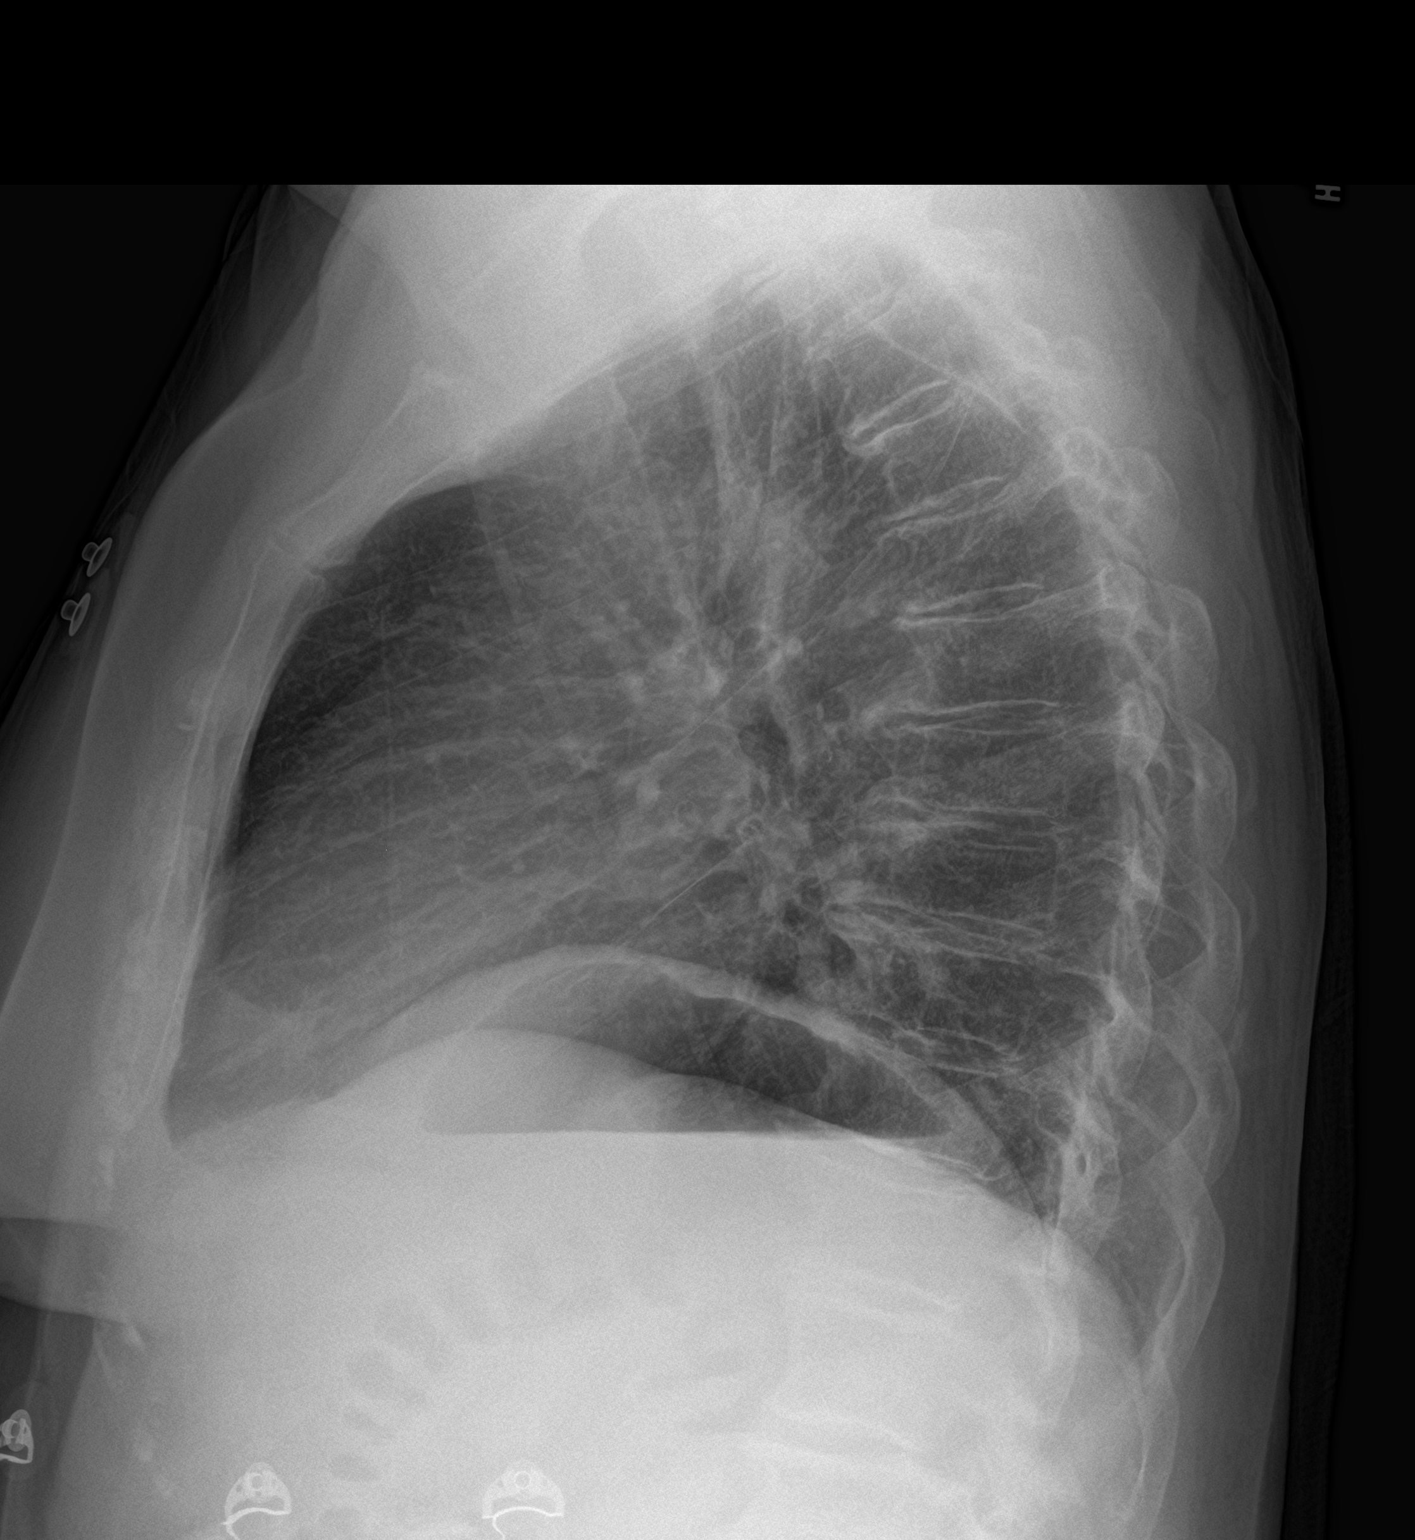

[2 of 2 positions shown; findings below may reference images not displayed]

FINDINGS: Normal heart size, mediastinal contours, and pulmonary vascularity.

Atherosclerotic calcification aorta.

Bronchitic changes without infiltrate, pleural effusion or
pneumothorax.

Chronic RIGHT rotator cuff tear.

Bones demineralized with scattered endplate spur formation thoracic
spine.
IMPRESSION: Bronchitic changes without infiltrate.

## 2018-10-05 IMAGING — US US CAROTID DUPLEX BILAT
1 series · 13 of 24 positions shown · non-contrast
Comparison: None.

CLINICAL DATA: Syncope

EXAM:
BILATERAL CAROTID DUPLEX ULTRASOUND
TECHNIQUE: Gray scale imaging, color Doppler and duplex ultrasound were
performed of bilateral carotid and vertebral arteries in the neck.

[Series 1: us carotid duplex bilat · 13 of 55 slices shown]
[im 1/55]
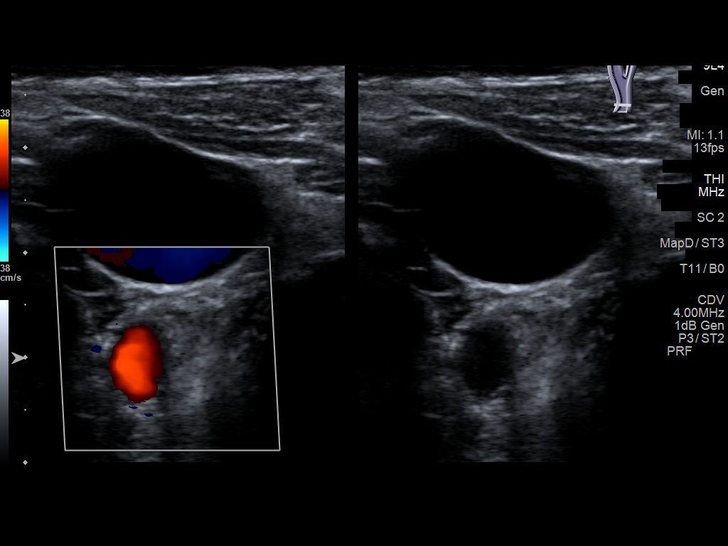
[im 5/55]
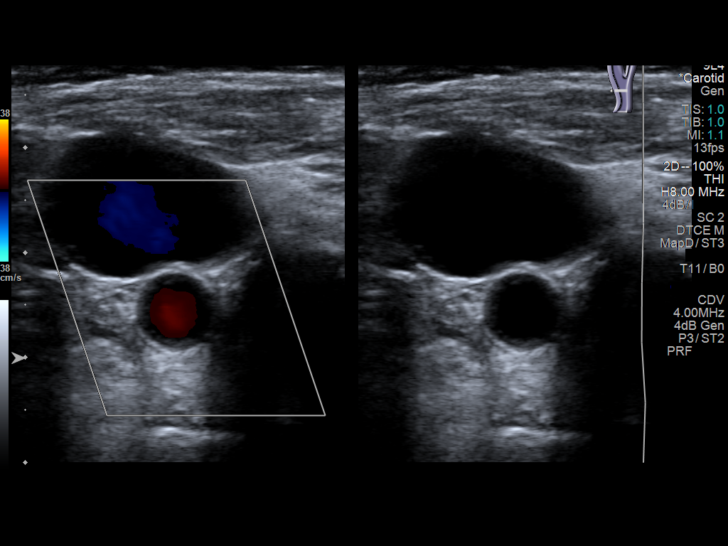
[im 10/55]
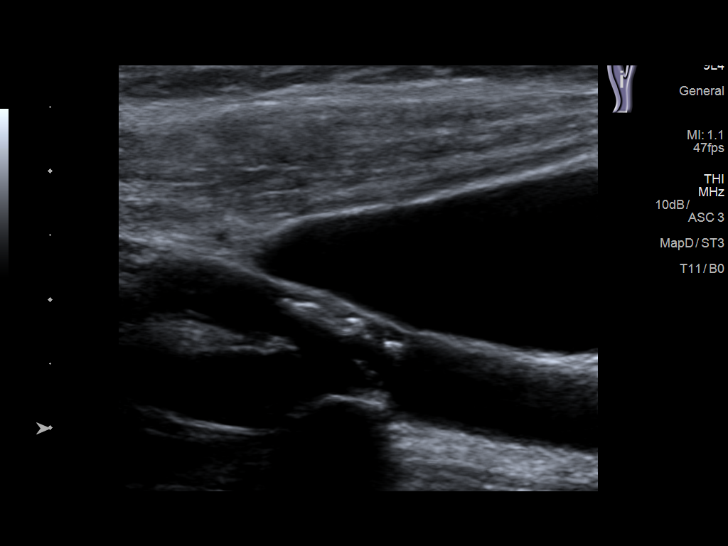
[im 15/55]
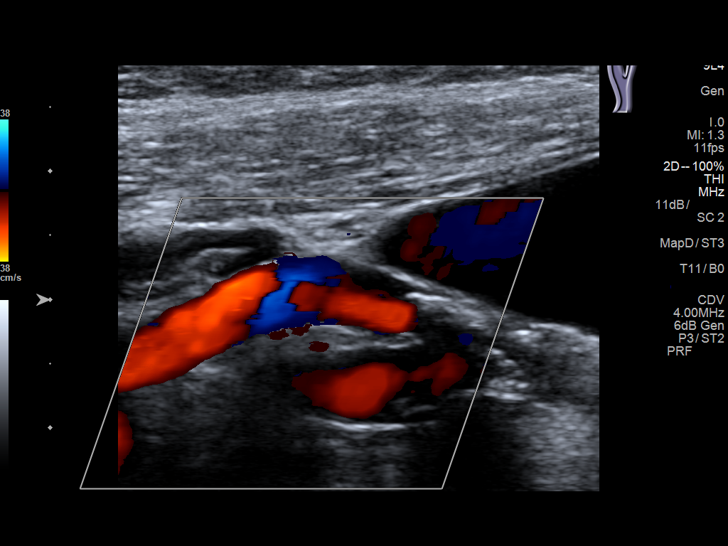
[im 19/55]
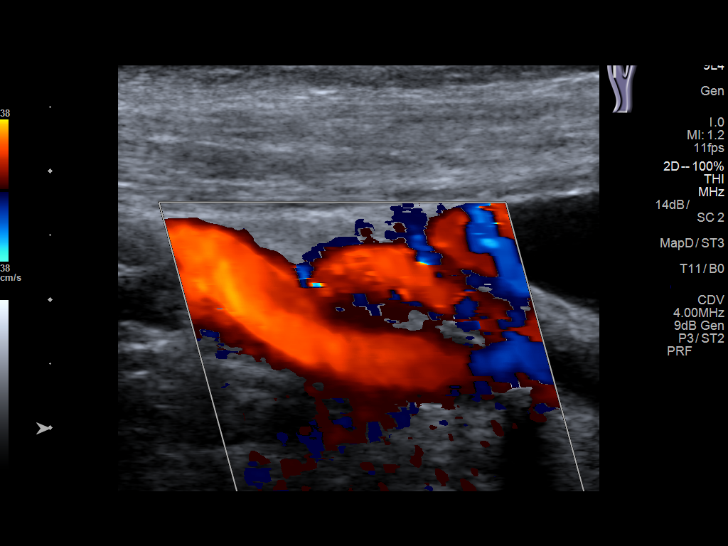
[im 24/55]
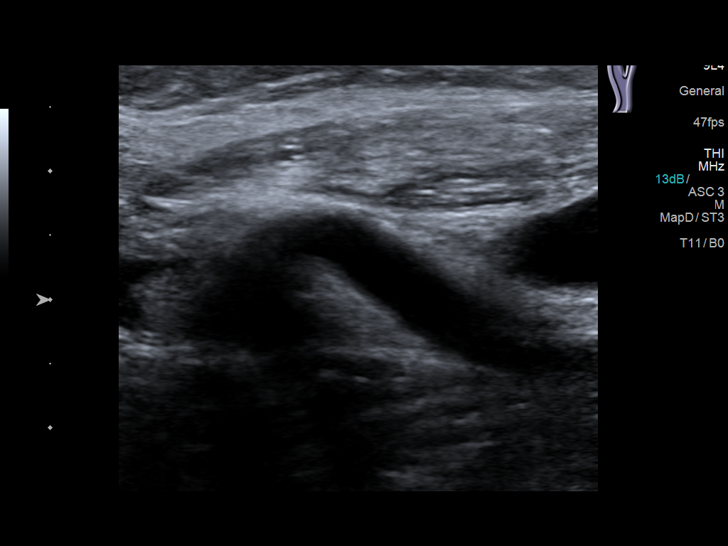
[im 29/55]
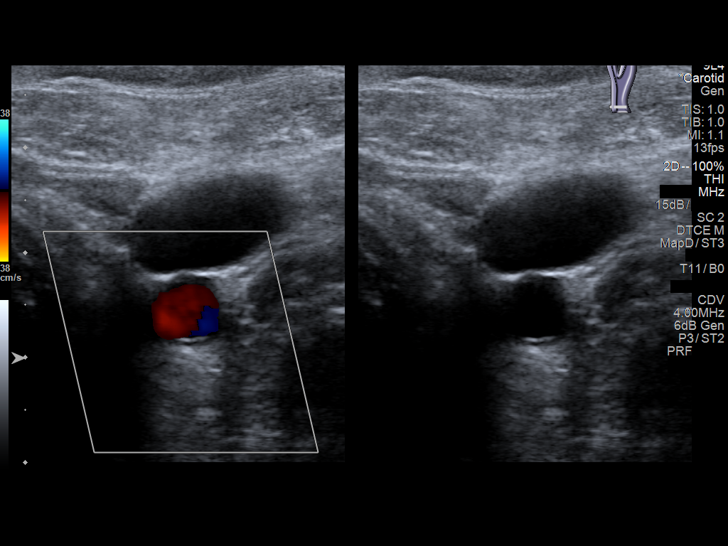
[im 31/55]
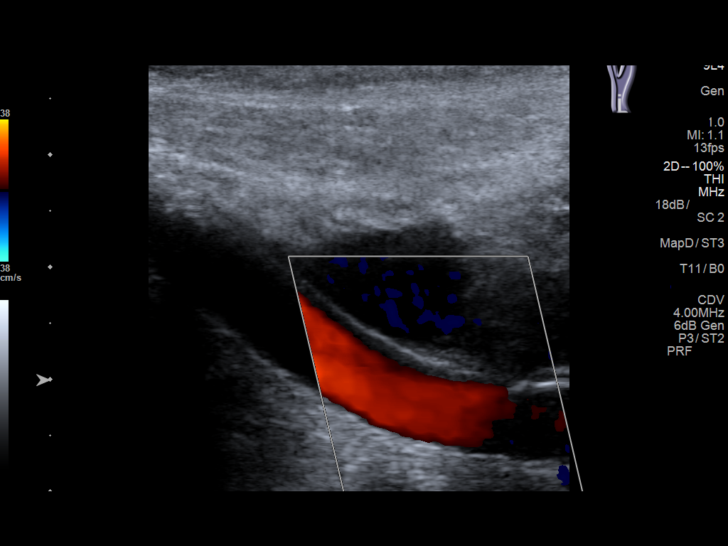
[im 36/55]
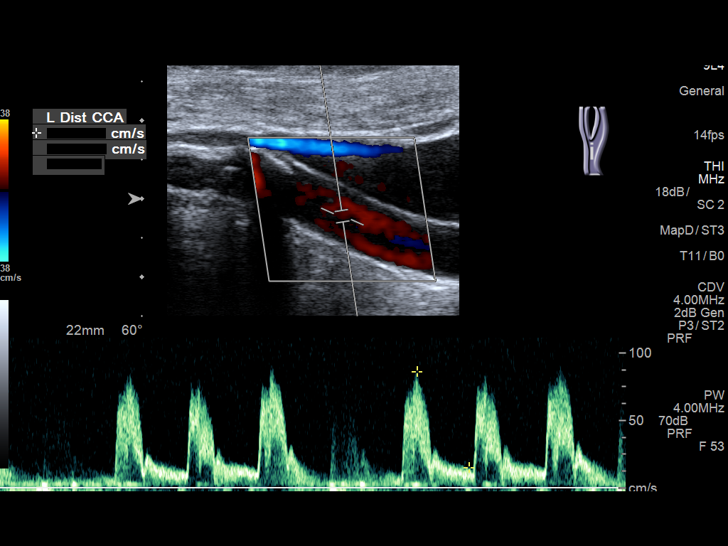
[im 40/55]
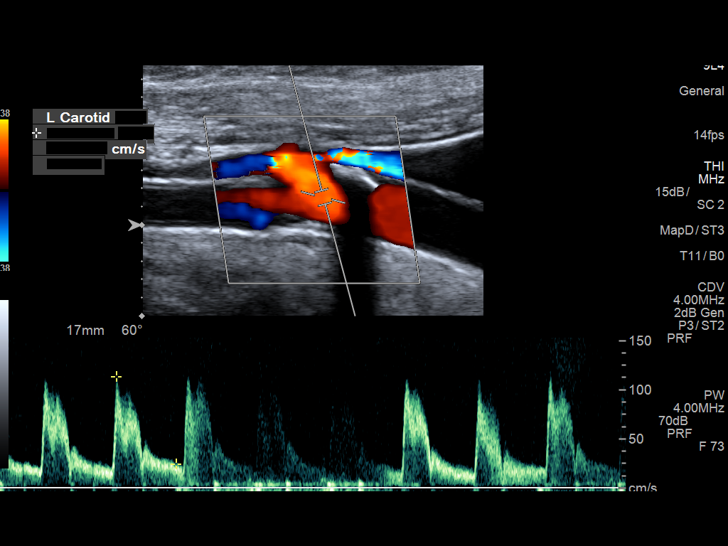
[im 45/55]
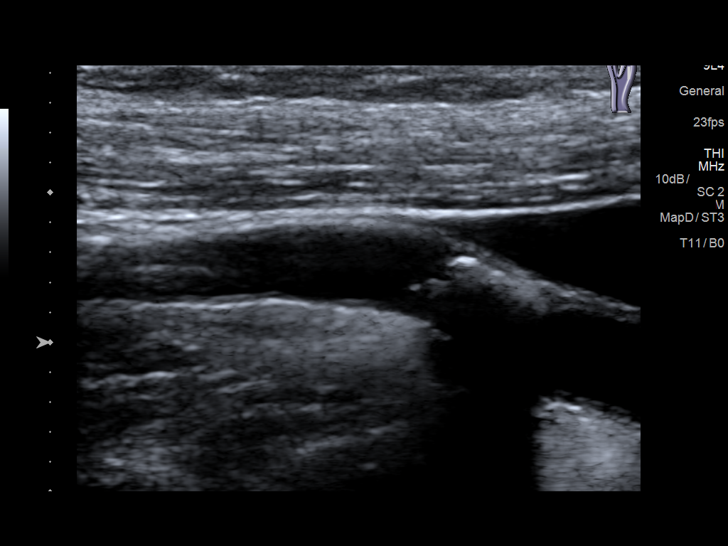
[im 50/55]
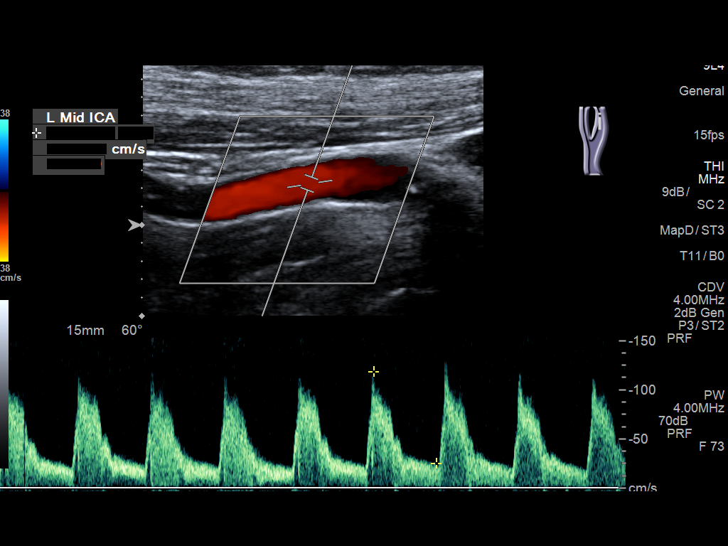
[im 55/55]
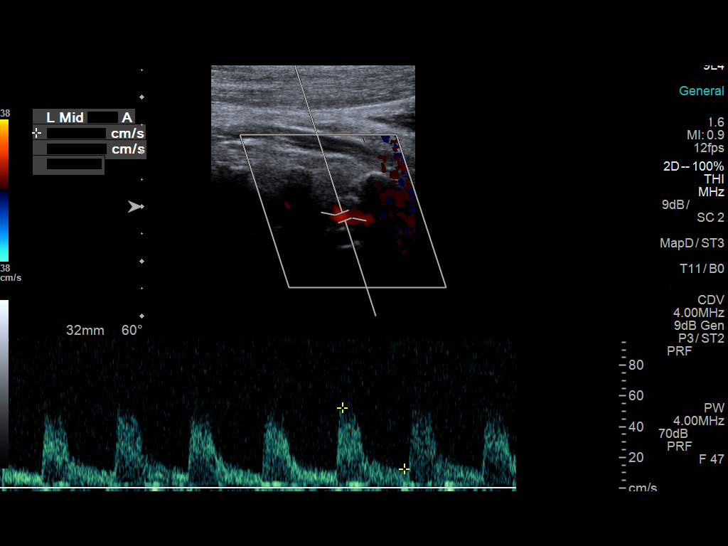

[13 of 24 positions shown; findings below may reference images not displayed]

FINDINGS: Criteria: Quantification of carotid stenosis is based on velocity
parameters that correlate the residual internal carotid diameter
with NASCET-based stenosis levels, using the diameter of the distal
internal carotid lumen as the denominator for stenosis measurement.

The following velocity measurements were obtained:

RIGHT

ICA:  80/13 cm/sec

CCA:  75/17 cm/sec

SYSTOLIC ICA/CCA RATIO:

DIASTOLIC ICA/CCA RATIO:

ECA:  316 cm/sec

LEFT

ICA:  127/30 cm/sec

CCA:  86/15 cm/sec

SYSTOLIC ICA/CCA RATIO:

DIASTOLIC ICA/CCA RATIO:  2

ECA:  106 cm/sec

RIGHT CAROTID ARTERY: Hard and soft plaque is seen in the carotid
bulb and proximal ICA.

RIGHT VERTEBRAL ARTERY:  Antegrade flow

LEFT CAROTID ARTERY: Heart and soft plaque is seen in the carotid
bulb and proximal ICA.

LEFT VERTEBRAL ARTERY:  Antegrade flow
IMPRESSION: 1. Less than 50% stenosis in the right ICA.
2. The left distal ICA demonstrates a minimally elevated peak
systolic velocity. However, the end-diastolic velocity is normal,
there is visually no stenosis, and the ICA/CCA ratio is less than 2.
Taking the factors in combination, there is no significant stenosis
on the left.
3. Antegrade flow in both vertebral arteries.

## 2019-07-24 ENCOUNTER — Other Ambulatory Visit: Payer: Self-pay | Admitting: Neurosurgery

## 2019-07-24 DIAGNOSIS — G8929 Other chronic pain: Secondary | ICD-10-CM

## 2019-07-24 DIAGNOSIS — M5441 Lumbago with sciatica, right side: Secondary | ICD-10-CM

## 2019-08-08 ENCOUNTER — Other Ambulatory Visit: Payer: Self-pay

## 2019-08-08 ENCOUNTER — Ambulatory Visit
Admission: RE | Admit: 2019-08-08 | Discharge: 2019-08-08 | Disposition: A | Discharge status: Home / Self Care | Payer: Medicare HMO | Source: Ambulatory Visit | Attending: Neurosurgery | Admitting: Neurosurgery

## 2019-08-08 DIAGNOSIS — G8929 Other chronic pain: Secondary | ICD-10-CM | POA: Diagnosis present

## 2019-08-08 DIAGNOSIS — M5442 Lumbago with sciatica, left side: Secondary | ICD-10-CM | POA: Diagnosis present

## 2019-08-08 DIAGNOSIS — M5441 Lumbago with sciatica, right side: Secondary | ICD-10-CM | POA: Diagnosis present

## 2019-09-18 ENCOUNTER — Ambulatory Visit: Payer: Medicare HMO | Admitting: Dermatology

## 2019-10-07 ENCOUNTER — Ambulatory Visit: Payer: Medicare HMO | Admitting: Dermatology

## 2019-12-09 ENCOUNTER — Ambulatory Visit: Payer: Medicare HMO | Admitting: Dermatology

## 2019-12-16 ENCOUNTER — Ambulatory Visit: Payer: Medicare HMO | Admitting: Dermatology

## 2019-12-17 ENCOUNTER — Emergency Department
Admission: EM | Admit: 2019-12-17 | Discharge: 2019-12-17 | Disposition: A | Discharge status: Home / Self Care | Payer: Medicare HMO | Attending: Emergency Medicine | Admitting: Emergency Medicine

## 2019-12-17 ENCOUNTER — Other Ambulatory Visit: Payer: Self-pay

## 2019-12-17 ENCOUNTER — Emergency Department: Payer: Medicare HMO

## 2019-12-17 DIAGNOSIS — Y92002 Bathroom of unspecified non-institutional (private) residence single-family (private) house as the place of occurrence of the external cause: Secondary | ICD-10-CM | POA: Insufficient documentation

## 2019-12-17 DIAGNOSIS — R55 Syncope and collapse: Secondary | ICD-10-CM | POA: Diagnosis not present

## 2019-12-17 DIAGNOSIS — W010XXA Fall on same level from slipping, tripping and stumbling without subsequent striking against object, initial encounter: Secondary | ICD-10-CM | POA: Diagnosis not present

## 2019-12-17 DIAGNOSIS — Y999 Unspecified external cause status: Secondary | ICD-10-CM | POA: Diagnosis not present

## 2019-12-17 DIAGNOSIS — S022XXA Fracture of nasal bones, initial encounter for closed fracture: Secondary | ICD-10-CM | POA: Diagnosis not present

## 2019-12-17 DIAGNOSIS — S0993XA Unspecified injury of face, initial encounter: Secondary | ICD-10-CM | POA: Diagnosis present

## 2019-12-17 DIAGNOSIS — Z01818 Encounter for other preprocedural examination: Secondary | ICD-10-CM | POA: Diagnosis not present

## 2019-12-17 DIAGNOSIS — R040 Epistaxis: Secondary | ICD-10-CM | POA: Diagnosis not present

## 2019-12-17 DIAGNOSIS — E039 Hypothyroidism, unspecified: Secondary | ICD-10-CM | POA: Diagnosis not present

## 2019-12-17 DIAGNOSIS — Z79899 Other long term (current) drug therapy: Secondary | ICD-10-CM | POA: Diagnosis not present

## 2019-12-17 DIAGNOSIS — Z96653 Presence of artificial knee joint, bilateral: Secondary | ICD-10-CM | POA: Diagnosis not present

## 2019-12-17 DIAGNOSIS — Y9389 Activity, other specified: Secondary | ICD-10-CM | POA: Diagnosis not present

## 2019-12-17 DIAGNOSIS — S0992XA Unspecified injury of nose, initial encounter: Secondary | ICD-10-CM | POA: Diagnosis not present

## 2019-12-17 DIAGNOSIS — S0083XA Contusion of other part of head, initial encounter: Secondary | ICD-10-CM

## 2019-12-17 DIAGNOSIS — I10 Essential (primary) hypertension: Secondary | ICD-10-CM | POA: Diagnosis not present

## 2019-12-17 DIAGNOSIS — S0181XA Laceration without foreign body of other part of head, initial encounter: Secondary | ICD-10-CM

## 2019-12-17 DIAGNOSIS — R42 Dizziness and giddiness: Secondary | ICD-10-CM | POA: Diagnosis not present

## 2019-12-17 DIAGNOSIS — J4540 Moderate persistent asthma, uncomplicated: Secondary | ICD-10-CM | POA: Diagnosis not present

## 2019-12-17 MED ORDER — TRAMADOL HCL 50 MG PO TABS
50.0000 mg | ORAL_TABLET | Freq: Once | ORAL | Status: AC
  Administered 2019-12-17: 50 mg via ORAL
  Filled 2019-12-17: qty 1

## 2019-12-17 MED ORDER — OXYMETAZOLINE HCL 0.05 % NA SOLN
1.0000 | Freq: Once | NASAL | Status: AC
  Administered 2019-12-17: 1 via NASAL
  Filled 2019-12-17: qty 30

## 2019-12-17 MED ORDER — LIDOCAINE-EPINEPHRINE-TETRACAINE (LET) TOPICAL GEL
3.0000 mL | Freq: Once | TOPICAL | Status: AC
  Administered 2019-12-17: 3 mL via TOPICAL
  Filled 2019-12-17: qty 3

## 2019-12-17 NOTE — ED Notes (Signed)
Pt was being seen at [facility] today when she fell on the way to the bathroom. Pt broke her dentures and has controlled bleeding at this time in her mouth.

## 2019-12-17 NOTE — ED Triage Notes (Addendum)
See first nurse note, pt fell at [facility]. Pt a&o x 4. states she was headed to bathroom when shoe caught on floor causing pt to fall. Pt denies any LOC. Pt hit face on floor causing upper dentures to break and laceration on inner upper lip. Pt has gauze in mouth. When assessing mouth it appears clot has formed in upper lip covering lac. Pt nose started bleeding during triage clamp placed on nose at this time. Large skin tear to upper left arm.   Pt denies LOC multiple times during triage.

## 2019-12-17 NOTE — ED Provider Notes (Signed)
[facility] Emergency Department Provider Note   ____________________________________________   First MD Initiated Contact with Patient 12/17/19 1405     (approximate)  I have reviewed the triage vital signs and the nursing notes.   HISTORY  Chief Complaint Fall    HPI Cynthia Mahoney is a 85 y.o. female patient presents with facial injury secondary to a trip and fall.  Patient states she was exiting the bathroom when her shoe caught on the floor causing her to fall.  Patient denies LOC.  Patient say fall was hard enough to cause her dentures to fracture and cut her upper lip.  Patient also has a nosebleed secondary to to fall.  Patient also has avulsion skin tear to the left forearm.  Patient  arrived at urgent care clinic for evaluation of dyspnea secondary to his asthmatic condition..  Patient said  shortness of breath last night.  Patient had a breathing treatment resulting in a near syncope episode.  Patient then went to the bathroom when she tripped and fell.         Past Medical History:  Diagnosis Date  . Anxiety   . Arthritis   . GERD (gastroesophageal reflux disease)   . Headache(784.0)    stress  . Hypertension   . Hypothyroidism   . PONV (postoperative nausea and vomiting)     Patient Active Problem List   Diagnosis Date Noted  . Syncope 12/28/2016    Past Surgical History:  Procedure Laterality Date  . [place] Bilateral 2008, 2013   knee  . SHOULDER ARTHROSCOPY WITH OPEN ROTATOR CUFF REPAIR Right 04/2012  . TONSILLECTOMY      Prior to Admission medications   Medication Sig Start Date End Date Taking? Authorizing Provider  aspirin 81 MG tablet Take 81 mg by mouth daily.    [provider]  calcium carbonate (OS-CAL) 600 MG TABS tablet Take 600 mg by mouth daily.    [provider]  ciprofloxacin (CIPRO) 500 MG tablet Take 1 tablet (500 mg  total) by mouth 2 (two) times daily. 03/09/17   Johnn Hai, PA-C  dorzolamide-timolol (COSOPT) 22.3-6.8 MG/ML ophthalmic solution Place 1 drop into both eyes 2 (two) times daily.    [provider]  Doxylamine Succinate, Sleep, (SLEEP AID PO) Take 1 tablet by mouth at bedtime.    [provider]  gabapentin (NEURONTIN) 300 MG capsule Take 300-600 mg by mouth 2 (two) times daily. Takes 600 mg in the morning and 300 mg in the evening    [provider]  latanoprost (XALATAN) 0.005 % ophthalmic solution Place 1 drop into both eyes at bedtime.    [provider]  levothyroxine (SYNTHROID, LEVOTHROID) 88 MCG tablet Take 88 mcg by mouth daily before breakfast.    [provider]  losartan-hydrochlorothiazide (HYZAAR) 50-12.5 MG tablet Take 1 tablet by mouth daily. 12/23/16   [provider]  magnesium oxide (MAG-OX) 400 MG tablet Take 400 mg by mouth daily.    [provider]  meloxicam (MOBIC) 7.5 MG tablet Take 1 tablet by mouth 2 (two) times daily. 12/23/16   [provider]  metoprolol tartrate (LOPRESSOR) 25 MG tablet Take 25 mg by mouth 2 (two) times daily.    [provider]  montelukast (SINGULAIR) 10 MG tablet Take 1 tablet by mouth daily. 12/02/16   [provider]  Multiple Vitamin (MULTIVITAMIN WITH MINERALS) TABS  tablet Take 1 tablet by mouth daily.    [provider]  Multiple Vitamins-Minerals (VISION FORMULA/LUTEIN PO) Take 1 tablet by mouth daily.    [provider]  omeprazole (PRILOSEC) 20 MG capsule Take 20 mg by mouth daily.    [provider]  potassium chloride (K-DUR) 10 MEQ tablet Take 10 mEq by mouth daily.    [provider]  pravastatin (PRAVACHOL) 40 MG tablet Take 40 mg by mouth at bedtime.     [provider]  triamterene-hydrochlorothiazide (DYAZIDE) 37.5-25 MG per capsule Take 1 capsule by mouth every morning.    [provider]     Allergies Amoxicillin and Erythromycin  Family History  Problem Relation Age of Onset  . Breast cancer Neg Hx     Social History Social History   Tobacco Use  . Smoking status: Never Smoker  . Smokeless tobacco: Never Used  Substance Use Topics  . Alcohol use: No  . Drug use: No    Review of Systems  Constitutional: No fever/chills Eyes: No visual changes. ENT: No sore throat.  Nosebleeds secondary to contusion. Cardiovascular: Denies chest pain. Respiratory: shortness of breath. Gastrointestinal: No abdominal pain.  No nausea, no vomiting.  No diarrhea.  No constipation. Genitourinary: Negative for dysuria. Musculoskeletal: Negative for back pain. Skin: Negative for rash.  Nasal and upper lip laceration. Neurological: Negative for headaches, focal weakness or numbness. Psychiatric:  Anxiety Endocrine:  Hypertension hypothyroidism. Hematological/Lymphatic:  Allergic/Immunilogical: Amoxicillin and erythromycin. ____________________________________________   PHYSICAL EXAM:  VITAL SIGNS: ED Triage Vitals  Enc Vitals Group     BP 12/17/19 1304 (!) 165/72     Pulse Rate 12/17/19 1304 76     Resp 12/17/19 1304 18     Temp 12/17/19 1304 98.8 F (37.1 C)     Temp Source 12/17/19 1304 Axillary     SpO2 12/17/19 1304 97 %     Weight --      Height --      Head Circumference --      Peak Flow --      Pain Score 12/17/19 1306 6     Pain Loc --      Pain Edu? --      Excl. in [place]? --     Constitutional: Alert and oriented. Well appearing and in no acute distress. Eyes: Conjunctivae are normal. PERRL. EOMI. Head: Atraumatic. Nose: Epistaxis. Mouth/Throat: Mucous membranes are moist.  Oropharynx non-erythematous. Neck: No stridor. No cervical spine tenderness to palpation. Hematological/Lymphatic/Immunilogical: No cervical lymphadenopathy. Cardiovascular: Normal rate, regular rhythm. Grossly normal heart sounds.  Good peripheral circulation.  Elevated blood  pressure. Respiratory: Normal respiratory effort.  No retractions. Lungs CTAB. Gastrointestinal: Soft and nontender. No distention. No abdominal bruits. No CVA tenderness. Genitourinary: Deferred Musculoskeletal: No lower extremity tenderness nor edema.  No joint effusions. Neurologic:  Normal speech and language. No gross focal neurologic deficits are appreciated. No gait instability. Skin: Nasal edema.  Nasal laceration.  Upper lip laceration.  Psychiatric: Mood and affect are normal. Speech and behavior are normal.  ____________________________________________   LABS (all labs ordered are listed, but only abnormal results are displayed)  Labs Reviewed - No data to display ____________________________________________  EKG   ____________________________________________  RADIOLOGY  ED MD interpretation:    Official radiology report(s): CT Head Wo Contrast  Result Date: 12/17/2019 CLINICAL DATA:  Syncope, recurrent dizziness EXAM: CT HEAD WITHOUT CONTRAST CT MAXILLOFACIAL WITHOUT CONTRAST TECHNIQUE: Multidetector CT imaging of the head and maxillofacial structures were  performed using the standard protocol without intravenous contrast. Multiplanar CT image reconstructions of the maxillofacial structures were also generated. COMPARISON:  None. FINDINGS: CT HEAD FINDINGS Brain: No evidence of acute infarction, hemorrhage, extra-axial collection, ventriculomegaly, or mass effect. Generalized cerebral atrophy. Periventricular white matter low attenuation likely secondary to microangiopathy. Mineralization of bilateral basal ganglia. Vascular: Cerebrovascular atherosclerotic calcifications are noted. Skull: Negative for fracture or focal lesion. Sinuses/Orbits: Visualized portions of the orbits are unremarkable. Visualized portions of the paranasal sinuses are unremarkable. Visualized portions of the mastoid air cells are unremarkable. Other: None. CT MAXILLOFACIAL FINDINGS Osseous: Subtle  nondisplaced fracture of the right nasal bone. No other fracture or mandibular dislocation. No destructive process. Orbits: Negative. No traumatic or inflammatory finding. Sinuses: Paranasal sinuses are clear. Rightward deviation of the nasal septum. Soft tissues: Soft tissue laceration across the bridge of the nose. IMPRESSION: 1. No acute intracranial pathology. 2. Subtle nondisplaced fracture of the right nasal bone. 3. Soft tissue laceration across the bridge of the nose. Electronically Signed   By: Kathreen Devoid   On: 12/17/2019 14:35   CT Maxillofacial Wo Contrast  Result Date: 12/17/2019 CLINICAL DATA:  Syncope, recurrent dizziness EXAM: CT HEAD WITHOUT CONTRAST CT MAXILLOFACIAL WITHOUT CONTRAST TECHNIQUE: Multidetector CT imaging of the head and maxillofacial structures were performed using the standard protocol without intravenous contrast. Multiplanar CT image reconstructions of the maxillofacial structures were also generated. COMPARISON:  None. FINDINGS: CT HEAD FINDINGS Brain: No evidence of acute infarction, hemorrhage, extra-axial collection, ventriculomegaly, or mass effect. Generalized cerebral atrophy. Periventricular white matter low attenuation likely secondary to microangiopathy. Mineralization of bilateral basal ganglia. Vascular: Cerebrovascular atherosclerotic calcifications are noted. Skull: Negative for fracture or focal lesion. Sinuses/Orbits: Visualized portions of the orbits are unremarkable. Visualized portions of the paranasal sinuses are unremarkable. Visualized portions of the mastoid air cells are unremarkable. Other: None. CT MAXILLOFACIAL FINDINGS Osseous: Subtle nondisplaced fracture of the right nasal bone. No other fracture or mandibular dislocation. No destructive process. Orbits: Negative. No traumatic or inflammatory finding. Sinuses: Paranasal sinuses are clear. Rightward deviation of the nasal septum. Soft tissues: Soft tissue laceration across the bridge of the nose.  IMPRESSION: 1. No acute intracranial pathology. 2. Subtle nondisplaced fracture of the right nasal bone. 3. Soft tissue laceration across the bridge of the nose. Electronically Signed   By: Kathreen Devoid   On: 12/17/2019 14:35    ____________________________________________   PROCEDURES  Procedure(s) performed (including Critical Care):  Marland KitchenMarland KitchenLaceration Repair  Date/Time: 12/17/2019 4:29 PM Performed by: Sable Feil, PA-C Authorized by: Sable Feil, PA-C   Consent:    Consent obtained:  Verbal   Consent given by:  Patient   Risks discussed:  Infection, pain, poor cosmetic result and need for additional repair Anesthesia (see MAR for exact dosages):    Anesthesia method:  None Laceration details:    Location:  Lip   Lip location:  Upper exterior lip   Length (cm):  0.2   Depth (mm):  1 Repair type:    Repair type:  Simple Exploration:    Contaminated: no   Treatment:    Area cleansed with:  Betadine and saline   Amount of cleaning:  Standard Skin repair:    Repair method:  Tissue adhesive Approximation:    Approximation:  Close Post-procedure details:    Dressing:  Open (no dressing)   Patient tolerance of procedure:  Tolerated well, no immediate complications .Marland KitchenLaceration Repair  Date/Time: 12/17/2019 4:30 PM Performed by: Sable Feil, PA-C Authorized  by: Sable Feil, PA-C   Consent:    Consent obtained:  Verbal   Consent given by:  Patient   Risks discussed:  Infection, pain, poor cosmetic result and need for additional repair Anesthesia (see MAR for exact dosages):    Anesthesia method:  None Laceration details:    Location:  Face   Face location:  Nose   Length (cm):  0.2   Depth (mm):  1 Repair type:    Repair type:  Simple Exploration:    Contaminated: no   Treatment:    Area cleansed with:  Betadine and saline   Amount of cleaning:  Standard Skin repair:    Repair method:  Tissue adhesive Approximation:    Approximation:   Close Post-procedure details:    Dressing:  Open (no dressing)   Patient tolerance of procedure:  Tolerated well, no immediate complications     ____________________________________________   INITIAL IMPRESSION / ASSESSMENT AND PLAN / ED COURSE  As part of my medical decision making, I reviewed the following data within the [place]     Patient presents with epistaxis, nasal edema, nasal laceration, and lip laceration secondary to a fall.  Discussed CT findings with patient was remarkable only for a subtle nondisplaced nasal fracture.  See procedure note for wound closure.  Bleeding was controlled using Afrin and nasal clamp.  Patient given discharge care instructions and advised to have dressing change on her left forearm in 2 days.    Cynthia Mahoney was evaluated in Emergency Department on 12/17/2019 for the symptoms described in the history of present illness. She was evaluated in the context of the global COVID-19 pandemic, which necessitated consideration that the patient might be at risk for infection with the SARS-CoV-2 virus that causes COVID-19. Institutional protocols and algorithms that pertain to the evaluation of patients at risk for COVID-19 are in a state of rapid change based on information released by regulatory bodies including the CDC and federal and state organizations. These policies and algorithms were followed during the patient's care in the ED.       ____________________________________________   FINAL CLINICAL IMPRESSION(S) / ED DIAGNOSES  Final diagnoses:  Contusion of face, initial encounter  Closed fracture of nasal bone, initial encounter  Facial laceration, initial encounter  Epistaxis due to trauma     ED Discharge Orders    None       Note:  This document was prepared using Dragon voice recognition software and may include unintentional dictation errors.    Sable Feil, PA-C 12/17/19 1631    Delman Kitten,  MD 12/18/19 2044

## 2019-12-17 NOTE — ED Triage Notes (Signed)
First Nurse Note:    Arrives from [facility] for ED evaluation of syncope.  Patient had some SOB last night and today and after receiving a breathing treatment, had a syncopal episode.    Arrives AAOx3.  Skin warm and dry. NAD

## 2019-12-17 NOTE — Discharge Instructions (Signed)
Follow discharge care instruction have dressing change in 2 days.

## 2019-12-19 DIAGNOSIS — S022XXD Fracture of nasal bones, subsequent encounter for fracture with routine healing: Secondary | ICD-10-CM | POA: Diagnosis not present

## 2019-12-19 DIAGNOSIS — E7800 Pure hypercholesterolemia, unspecified: Secondary | ICD-10-CM | POA: Diagnosis not present

## 2019-12-19 DIAGNOSIS — R5383 Other fatigue: Secondary | ICD-10-CM | POA: Diagnosis not present

## 2019-12-19 DIAGNOSIS — Z79899 Other long term (current) drug therapy: Secondary | ICD-10-CM | POA: Diagnosis not present

## 2019-12-19 DIAGNOSIS — E039 Hypothyroidism, unspecified: Secondary | ICD-10-CM | POA: Diagnosis not present

## 2019-12-19 DIAGNOSIS — N189 Chronic kidney disease, unspecified: Secondary | ICD-10-CM | POA: Diagnosis not present

## 2019-12-24 DIAGNOSIS — J22 Unspecified acute lower respiratory infection: Secondary | ICD-10-CM | POA: Diagnosis not present

## 2019-12-24 DIAGNOSIS — Z20822 Contact with and (suspected) exposure to covid-19: Secondary | ICD-10-CM | POA: Diagnosis not present

## 2019-12-24 DIAGNOSIS — R05 Cough: Secondary | ICD-10-CM | POA: Diagnosis not present

## 2019-12-24 DIAGNOSIS — S41112D Laceration without foreign body of left upper arm, subsequent encounter: Secondary | ICD-10-CM | POA: Diagnosis not present

## 2019-12-24 DIAGNOSIS — J90 Pleural effusion, not elsewhere classified: Secondary | ICD-10-CM | POA: Diagnosis not present

## 2019-12-25 ENCOUNTER — Ambulatory Visit: Admit: 2019-12-25 | Payer: Medicare HMO | Admitting: Ophthalmology

## 2019-12-25 SURGERY — PHACOEMULSIFICATION, CATARACT, WITH IOL INSERTION
Anesthesia: Topical | Laterality: Right

## 2019-12-26 ENCOUNTER — Emergency Department: Payer: Medicare HMO

## 2019-12-26 ENCOUNTER — Other Ambulatory Visit: Payer: Self-pay

## 2019-12-26 ENCOUNTER — Inpatient Hospital Stay
Admission: EM | Admit: 2019-12-26 | Discharge: 2020-01-01 | DRG: 643 | Disposition: A | Discharge status: Home Health | Payer: Medicare HMO | Attending: Family Medicine | Admitting: Family Medicine

## 2019-12-26 DIAGNOSIS — D631 Anemia in chronic kidney disease: Secondary | ICD-10-CM | POA: Diagnosis not present

## 2019-12-26 DIAGNOSIS — J9601 Acute respiratory failure with hypoxia: Secondary | ICD-10-CM | POA: Diagnosis not present

## 2019-12-26 DIAGNOSIS — T502X5A Adverse effect of carbonic-anhydrase inhibitors, benzothiadiazides and other diuretics, initial encounter: Secondary | ICD-10-CM | POA: Diagnosis present

## 2019-12-26 DIAGNOSIS — Z79890 Hormone replacement therapy (postmenopausal): Secondary | ICD-10-CM

## 2019-12-26 DIAGNOSIS — G4700 Insomnia, unspecified: Secondary | ICD-10-CM | POA: Diagnosis present

## 2019-12-26 DIAGNOSIS — J189 Pneumonia, unspecified organism: Secondary | ICD-10-CM | POA: Diagnosis not present

## 2019-12-26 DIAGNOSIS — E871 Hypo-osmolality and hyponatremia: Secondary | ICD-10-CM

## 2019-12-26 DIAGNOSIS — H409 Unspecified glaucoma: Secondary | ICD-10-CM | POA: Diagnosis present

## 2019-12-26 DIAGNOSIS — E861 Hypovolemia: Secondary | ICD-10-CM | POA: Diagnosis present

## 2019-12-26 DIAGNOSIS — E039 Hypothyroidism, unspecified: Secondary | ICD-10-CM | POA: Diagnosis not present

## 2019-12-26 DIAGNOSIS — Z79899 Other long term (current) drug therapy: Secondary | ICD-10-CM | POA: Diagnosis not present

## 2019-12-26 DIAGNOSIS — R0602 Shortness of breath: Secondary | ICD-10-CM

## 2019-12-26 DIAGNOSIS — F419 Anxiety disorder, unspecified: Secondary | ICD-10-CM | POA: Diagnosis present

## 2019-12-26 DIAGNOSIS — E222 Syndrome of inappropriate secretion of antidiuretic hormone: Secondary | ICD-10-CM | POA: Diagnosis not present

## 2019-12-26 DIAGNOSIS — R05 Cough: Secondary | ICD-10-CM | POA: Diagnosis not present

## 2019-12-26 DIAGNOSIS — Z791 Long term (current) use of non-steroidal anti-inflammatories (NSAID): Secondary | ICD-10-CM

## 2019-12-26 DIAGNOSIS — I129 Hypertensive chronic kidney disease with stage 1 through stage 4 chronic kidney disease, or unspecified chronic kidney disease: Secondary | ICD-10-CM | POA: Diagnosis present

## 2019-12-26 DIAGNOSIS — J45901 Unspecified asthma with (acute) exacerbation: Secondary | ICD-10-CM | POA: Diagnosis not present

## 2019-12-26 DIAGNOSIS — K219 Gastro-esophageal reflux disease without esophagitis: Secondary | ICD-10-CM | POA: Diagnosis present

## 2019-12-26 DIAGNOSIS — Z20822 Contact with and (suspected) exposure to covid-19: Secondary | ICD-10-CM | POA: Diagnosis not present

## 2019-12-26 DIAGNOSIS — N1831 Chronic kidney disease, stage 3a: Secondary | ICD-10-CM | POA: Diagnosis not present

## 2019-12-26 DIAGNOSIS — D72829 Elevated white blood cell count, unspecified: Secondary | ICD-10-CM | POA: Diagnosis present

## 2019-12-26 DIAGNOSIS — I10 Essential (primary) hypertension: Secondary | ICD-10-CM | POA: Diagnosis not present

## 2019-12-26 DIAGNOSIS — Z7982 Long term (current) use of aspirin: Secondary | ICD-10-CM | POA: Diagnosis not present

## 2019-12-26 DIAGNOSIS — J40 Bronchitis, not specified as acute or chronic: Secondary | ICD-10-CM | POA: Diagnosis not present

## 2019-12-26 DIAGNOSIS — R5381 Other malaise: Secondary | ICD-10-CM | POA: Diagnosis present

## 2019-12-26 DIAGNOSIS — D649 Anemia, unspecified: Secondary | ICD-10-CM | POA: Diagnosis not present

## 2019-12-26 DIAGNOSIS — E876 Hypokalemia: Secondary | ICD-10-CM | POA: Diagnosis not present

## 2019-12-26 DIAGNOSIS — R531 Weakness: Secondary | ICD-10-CM | POA: Diagnosis not present

## 2019-12-26 DIAGNOSIS — R062 Wheezing: Secondary | ICD-10-CM | POA: Diagnosis not present

## 2019-12-26 HISTORY — DX: Unspecified asthma, uncomplicated: J45.909

## 2019-12-26 LAB — BLOOD GAS, VENOUS
Acid-Base Excess: 1.5 mmol/L (ref 0.0–2.0)
Bicarbonate: 25.9 mmol/L (ref 20.0–28.0)
O2 Saturation: 88.6 %
Patient temperature: 37
pCO2, Ven: 39 mmHg — ABNORMAL LOW (ref 44.0–60.0)
pH, Ven: 7.43 (ref 7.250–7.430)
pO2, Ven: 54 mmHg — ABNORMAL HIGH (ref 32.0–45.0)

## 2019-12-26 LAB — CBC WITH DIFFERENTIAL/PLATELET
Abs Immature Granulocytes: 0.37 K/uL — ABNORMAL HIGH (ref 0.00–0.07)
Basophils Absolute: 0 K/uL (ref 0.0–0.1)
Basophils Relative: 0 %
Eosinophils Absolute: 0.2 K/uL (ref 0.0–0.5)
Eosinophils Relative: 2 %
HCT: 29.5 % — ABNORMAL LOW (ref 36.0–46.0)
Hemoglobin: 10.8 g/dL — ABNORMAL LOW (ref 12.0–15.0)
Immature Granulocytes: 3 %
Lymphocytes Relative: 14 %
Lymphs Abs: 1.8 K/uL (ref 0.7–4.0)
MCH: 32.6 pg (ref 26.0–34.0)
MCHC: 36.6 g/dL — ABNORMAL HIGH (ref 30.0–36.0)
MCV: 89.1 fL (ref 80.0–100.0)
Monocytes Absolute: 0.7 K/uL (ref 0.1–1.0)
Monocytes Relative: 6 %
Neutro Abs: 9.9 K/uL — ABNORMAL HIGH (ref 1.7–7.7)
Neutrophils Relative %: 75 %
Platelets: 465 K/uL — ABNORMAL HIGH (ref 150–400)
RBC: 3.31 MIL/uL — ABNORMAL LOW (ref 3.87–5.11)
RDW: 11.9 % (ref 11.5–15.5)
WBC: 13.1 K/uL — ABNORMAL HIGH (ref 4.0–10.5)
nRBC: 0 % (ref 0.0–0.2)

## 2019-12-26 LAB — COMPREHENSIVE METABOLIC PANEL
ALT: 14 U/L (ref 0–44)
AST: 22 U/L (ref 15–41)
Albumin: 3.3 g/dL — ABNORMAL LOW (ref 3.5–5.0)
Alkaline Phosphatase: 100 U/L (ref 38–126)
Anion gap: 12 (ref 5–15)
BUN: 14 mg/dL (ref 8–23)
CO2: 24 mmol/L (ref 22–32)
Calcium: 8.6 mg/dL — ABNORMAL LOW (ref 8.9–10.3)
Chloride: 79 mmol/L — ABNORMAL LOW (ref 98–111)
Creatinine, Ser: 1.05 mg/dL — ABNORMAL HIGH (ref 0.44–1.00)
GFR calc Af Amer: 57 mL/min — ABNORMAL LOW (ref >60)
GFR calc non Af Amer: 49 mL/min — ABNORMAL LOW (ref >60)
Glucose, Bld: 133 mg/dL — ABNORMAL HIGH (ref 70–99)
Potassium: 3.3 mmol/L — ABNORMAL LOW (ref 3.5–5.1)
Sodium: 115 mmol/L — CL (ref 135–145)
Total Bilirubin: 0.7 mg/dL (ref 0.3–1.2)
Total Protein: 6.9 g/dL (ref 6.5–8.1)

## 2019-12-26 LAB — SARS CORONAVIRUS 2 BY RT PCR (HOSPITAL ORDER, PERFORMED IN ~~LOC~~ HOSPITAL LAB): SARS Coronavirus 2: NEGATIVE

## 2019-12-26 LAB — BRAIN NATRIURETIC PEPTIDE: B Natriuretic Peptide: 39.7 pg/mL (ref 0.0–100.0)

## 2019-12-26 LAB — TROPONIN I (HIGH SENSITIVITY): Troponin I (High Sensitivity): 7 ng/L (ref <18)

## 2019-12-26 MED ORDER — IPRATROPIUM-ALBUTEROL 0.5-2.5 (3) MG/3ML IN SOLN
3.0000 mL | Freq: Once | RESPIRATORY_TRACT | Status: AC
  Administered 2019-12-26: 3 mL via RESPIRATORY_TRACT
  Filled 2019-12-26: qty 3

## 2019-12-26 MED ORDER — IPRATROPIUM-ALBUTEROL 0.5-2.5 (3) MG/3ML IN SOLN: 3.0000 mL | Freq: Once | RESPIRATORY_TRACT | Status: AC

## 2019-12-26 MED ORDER — IPRATROPIUM-ALBUTEROL 0.5-2.5 (3) MG/3ML IN SOLN
3.0000 mL | Freq: Once | RESPIRATORY_TRACT | Status: AC
  Administered 2019-12-26: 3 mL via RESPIRATORY_TRACT

## 2019-12-26 MED ORDER — SODIUM CHLORIDE 0.9 % IV SOLN: Freq: Once | INTRAVENOUS | Status: AC

## 2019-12-26 MED ORDER — SODIUM CHLORIDE 0.9 % IV BOLUS
250.0000 mL | Freq: Once | INTRAVENOUS | Status: AC
  Administered 2019-12-26: 250 mL via INTRAVENOUS

## 2019-12-26 MED ORDER — IPRATROPIUM-ALBUTEROL 0.5-2.5 (3) MG/3ML IN SOLN
RESPIRATORY_TRACT | Status: AC
  Administered 2019-12-26: 3 mL via RESPIRATORY_TRACT
  Filled 2019-12-26: qty 6

## 2019-12-26 NOTE — ED Provider Notes (Signed)
[facility] Emergency Department Provider Note  ____________________________________________   First MD Initiated Contact with Patient 12/26/19 2151     (approximate)  I have reviewed the triage vital signs and the nursing notes.   HISTORY  Chief Complaint Shortness of Breath    HPI Cynthia Mahoney is a 85 y.o. female  Here with generalized weakness and shortness of breath. Pt reports that she was recently evaluated by her PCP for cough, nasal congestion, and generalize dweakness. She was started on Levaquin and discharged. She reportedly fell while leaving. Since then, she has had worsening cough, SOb, and fatigue. She did not hit her chest when falling and denies any chest pain. She states she just "feels awful," with weakness and malaise. She endorses significant SOB and sensation like she cannot catch her breath. Of note, pt has also recently seen Dr. Lanney Gins for possible asthma. She does endorse wheezing over the past several days.   Past Medical History:  Diagnosis Date  . Anxiety   . Arthritis   . Asthma   . GERD (gastroesophageal reflux disease)   . Headache(784.0)    stress  . Hypertension   . Hypothyroidism   . PONV (postoperative nausea and vomiting)     Patient Active Problem List   Diagnosis Date Noted  . Hyponatremia 12/26/2019  . Shortness of breath 12/26/2019  . HTN (hypertension) 12/26/2019  . Hypothyroidism 12/26/2019  . Syncope 12/28/2016    Past Surgical History:  Procedure Laterality Date  . [place] Bilateral 2008, 2013   knee  . SHOULDER ARTHROSCOPY WITH OPEN ROTATOR CUFF REPAIR Right 04/2012  . TONSILLECTOMY      Prior to Admission medications   Medication Sig Start Date End Date Taking? Authorizing Provider  aspirin 81 MG tablet Take 81 mg by mouth daily.    [provider]  calcium carbonate (OS-CAL) 600 MG TABS tablet Take 600 mg by  mouth daily.    [provider]  ciprofloxacin (CIPRO) 500 MG tablet Take 1 tablet (500 mg total) by mouth 2 (two) times daily. 03/09/17   Johnn Hai, PA-C  dorzolamide-timolol (COSOPT) 22.3-6.8 MG/ML ophthalmic solution Place 1 drop into both eyes 2 (two) times daily.    [provider]  Doxylamine Succinate, Sleep, (SLEEP AID PO) Take 1 tablet by mouth at bedtime.    [provider]  gabapentin (NEURONTIN) 300 MG capsule Take 300-600 mg by mouth 2 (two) times daily. Takes 600 mg in the morning and 300 mg in the evening    [provider]  latanoprost (XALATAN) 0.005 % ophthalmic solution Place 1 drop into both eyes at bedtime.    [provider]  levothyroxine (SYNTHROID, LEVOTHROID) 88 MCG tablet Take 88 mcg by mouth daily before breakfast.    [provider]  losartan-hydrochlorothiazide (HYZAAR) 50-12.5 MG tablet Take 1 tablet by mouth daily. 12/23/16   [provider]  magnesium oxide (MAG-OX) 400 MG tablet Take 400 mg by mouth daily.    [provider]  meloxicam (MOBIC) 7.5 MG tablet Take 1 tablet by mouth 2 (two) times daily. 12/23/16   [provider]  metoprolol tartrate (LOPRESSOR) 25 MG tablet Take 25 mg by mouth 2 (two) times daily.    [provider]  montelukast (SINGULAIR) 10 MG tablet Take 1 tablet by mouth daily. 12/02/16   [provider]  Multiple Vitamin (MULTIVITAMIN WITH MINERALS) TABS tablet  Take 1 tablet by mouth daily.    [provider]  Multiple Vitamins-Minerals (VISION FORMULA/LUTEIN PO) Take 1 tablet by mouth daily.    [provider]  omeprazole (PRILOSEC) 20 MG capsule Take 20 mg by mouth daily.    [provider]  potassium chloride (K-DUR) 10 MEQ tablet Take 10 mEq by mouth daily.    [provider]  pravastatin (PRAVACHOL) 40 MG tablet Take 40 mg by mouth at bedtime.     [provider]    triamterene-hydrochlorothiazide (DYAZIDE) 37.5-25 MG per capsule Take 1 capsule by mouth every morning.    [provider]    Allergies Amoxicillin and Erythromycin  Family History  Problem Relation Age of Onset  . Breast cancer Neg Hx     Social History Social History   Tobacco Use  . Smoking status: Never Smoker  . Smokeless tobacco: Never Used  Substance Use Topics  . Alcohol use: No  . Drug use: No    Review of Systems  Review of Systems  Constitutional: Positive for fatigue. Negative for fever.  HENT: Negative for congestion and sore throat.   Eyes: Negative for visual disturbance.  Respiratory: Positive for cough and shortness of breath.   Cardiovascular: Negative for chest pain.  Gastrointestinal: Negative for abdominal pain, diarrhea, nausea and vomiting.  Genitourinary: Negative for flank pain.  Musculoskeletal: Negative for back pain and neck pain.  Skin: Negative for rash and wound.  Neurological: Positive for weakness.  All other systems reviewed and are negative.    ____________________________________________  PHYSICAL EXAM:      VITAL SIGNS: ED Triage Vitals  Enc Vitals Group     BP --      Pulse Rate 12/26/19 2137 76     Resp 12/26/19 2137 (!) 22     Temp 12/26/19 2137 98 F (36.7 C)     Temp Source 12/26/19 2137 Oral     SpO2 12/26/19 2132 98 %     Weight 12/26/19 2134 192 lb (87.1 kg)     Height 12/26/19 2134 5\' 1"  (1.549 m)     Head Circumference --      Peak Flow --      Pain Score 12/26/19 2134 0     Pain Loc --      Pain Edu? --      Excl. in [place]? --      Physical Exam Vitals and nursing note reviewed.  Constitutional:      General: She is not in acute distress.    Appearance: She is well-developed.  HENT:     Head: Normocephalic and atraumatic.     Comments: Dry mucous membranes Eyes:     Conjunctiva/sclera: Conjunctivae normal.  Cardiovascular:     Rate and Rhythm: Normal rate and regular rhythm.     Heart  sounds: Normal heart sounds. No murmur heard.  No friction rub.  Pulmonary:     Effort: Pulmonary effort is normal. Tachypnea present. No respiratory distress.     Breath sounds: Decreased breath sounds and wheezing present.  Abdominal:     General: There is no distension.     Palpations: Abdomen is soft.     Tenderness: There is no abdominal tenderness.  Musculoskeletal:     Cervical back: Neck supple.  Skin:    General: Skin is warm.     Capillary Refill: Capillary refill takes less than 2 seconds.  Neurological:     Mental Status: She is alert and  oriented to person, place, and time.     Motor: No abnormal muscle tone.       ____________________________________________   LABS (all labs ordered are listed, but only abnormal results are displayed)  Labs Reviewed  CBC WITH DIFFERENTIAL/PLATELET - Abnormal; Notable for the following components:      Result Value   WBC 13.1 (*)    RBC 3.31 (*)    Hemoglobin 10.8 (*)    HCT 29.5 (*)    MCHC 36.6 (*)    Platelets 465 (*)    Neutro Abs 9.9 (*)    Abs Immature Granulocytes 0.37 (*)    All other components within normal limits  COMPREHENSIVE METABOLIC PANEL - Abnormal; Notable for the following components:   Sodium 115 (*)    Potassium 3.3 (*)    Chloride 79 (*)    Glucose, Bld 133 (*)    Creatinine, Ser 1.05 (*)    Calcium 8.6 (*)    Albumin 3.3 (*)    GFR calc non Af Amer 49 (*)    GFR calc Af Amer 57 (*)    All other components within normal limits  BLOOD GAS, VENOUS - Abnormal; Notable for the following components:   pCO2, Ven 39 (*)    pO2, Ven 54.0 (*)    All other components within normal limits  SARS CORONAVIRUS 2 BY RT PCR (HOSPITAL ORDER, [facility])  BRAIN NATRIURETIC PEPTIDE  URINALYSIS, COMPLETE (UACMP) WITH MICROSCOPIC  SODIUM, URINE, RANDOM  CREATININE, URINE, RANDOM  OSMOLALITY, URINE  OSMOLALITY  SODIUM  SODIUM  SODIUM  TROPONIN I (HIGH SENSITIVITY)  TROPONIN I (HIGH  SENSITIVITY)    ____________________________________________  EKG: Normal sinus rhythm, VR 81. QRS 116, QTc 491. PVCs noted. Nonspecific IVCD. No acute ST elevations or depressions. ________________________________________  RADIOLOGY All imaging, including plain films, CT scans, and ultrasounds, independently reviewed by me, and interpretations confirmed via formal radiology reads.  ED MD interpretation:   CXR: No acute cardiopulmonary disease  Official radiology report(s): DG Chest Portable 1 View  Result Date: 12/26/2019 CLINICAL DATA:  Shortness of breath EXAM: PORTABLE CHEST 1 VIEW COMPARISON:  12/28/2016 FINDINGS: Heart and mediastinal contours are within normal limits. No focal opacities or effusions. No acute bony abnormality. Degenerative changes in the shoulders bilaterally. IMPRESSION: No active cardiopulmonary disease. Electronically Signed   By: Rolm Baptise M.D.   On: 12/26/2019 22:31    ____________________________________________  PROCEDURES   Procedure(s) performed (including Critical Care):  Procedures  ____________________________________________  INITIAL IMPRESSION / MDM / [place] / ED COURSE  As part of my medical decision making, I reviewed the following data within the [place] notes reviewed and incorporated, Old chart reviewed, Notes from prior ED visits, and [place] [employer]rolled Sub[employer] [place] was evaluated in Emergency Department on 12/27/2019 for the symptoms described in the history of present illness. She was evaluated in the context of the global COVID-19 pandemic, which necessitated consideration that the patient might be at risk for infection with the SARS-CoV-2 virus that causes COVID-19. Institutional protocols and algorithms that pertain to the evaluation of patients at risk for COVID-19 are in a state of rapid change based on information released by regulatory bodies including the CDC and  federal and state organizations. These policies and algorithms were followed during the patient's care in the ED.  Some ED evaluations and interventions may be delayed as a result  of limited staffing during the pandemic.*     Medical Decision Making:  85 yo F here with shortness of breath and general fatigue. She has moderate diffuse wheezing on exam c/w possible asthma/RAD exacerbation. CXR is clear with no signs of PNA or CHF. BMP is notable for marked hyponatremia of 115. Will add on serum and urine osm, lytes. She appears mildly dehydrated so will give cautious fluids, but concern for possible medication-related hyponatremia, SIADH, or other etiology. Will need cautious correction. Admit to medicine. CBC notable for moderate leukocytosis, likely reactive.  ____________________________________________  FINAL CLINICAL IMPRESSION(S) / ED DIAGNOSES  Final diagnoses:  Hyponatremia  SOB (shortness of breath)  Exacerbation of asthma, unspecified asthma severity, unspecified whether persistent     MEDICATIONS GIVEN DURING THIS VISIT:  Medications  enoxaparin (LOVENOX) injection 40 mg (has no administration in time range)  0.9 %  sodium chloride infusion (has no administration in time range)  acetaminophen (TYLENOL) tablet 650 mg (has no administration in time range)    Or  acetaminophen (TYLENOL) suppository 650 mg (has no administration in time range)  ondansetron (ZOFRAN) tablet 4 mg (has no administration in time range)    Or  ondansetron (ZOFRAN) injection 4 mg (has no administration in time range)  ipratropium-albuterol (DUONEB) 0.5-2.5 (3) MG/3ML nebulizer solution 3 mL (3 mLs Nebulization Given 12/26/19 2201)  ipratropium-albuterol (DUONEB) 0.5-2.5 (3) MG/3ML nebulizer solution 3 mL (3 mLs Nebulization Given 12/26/19 2235)  ipratropium-albuterol (DUONEB) 0.5-2.5 (3) MG/3ML nebulizer solution 3 mL (3 mLs Nebulization Given 12/26/19 2235)  0.9 %  sodium chloride infusion ( Intravenous  New Bag/Given 12/26/19 2356)  sodium chloride 0.9 % bolus 250 mL (250 mLs Intravenous New Bag/Given 12/26/19 2356)     ED Discharge Orders    None       Note:  This document was prepared using Dragon voice recognition software and may include unintentional dictation errors.   Duffy Bruce, MD 12/27/19 0140

## 2019-12-26 NOTE — ED Triage Notes (Signed)
Pt from home via [facility] with complaint of increasing SOB since Tuesday. Was seen at urgent care and prescribed antibiotic for possible pneumonia. New diagnosis asthma   Used home inhalers without relief 125 solu medrol

## 2019-12-26 NOTE — ED Notes (Signed)
Cultures x2 sent to lab with save labels

## 2019-12-27 DIAGNOSIS — I10 Essential (primary) hypertension: Secondary | ICD-10-CM

## 2019-12-27 DIAGNOSIS — E871 Hypo-osmolality and hyponatremia: Secondary | ICD-10-CM

## 2019-12-27 LAB — TROPONIN I (HIGH SENSITIVITY): Troponin I (High Sensitivity): 5 ng/L (ref <18)

## 2019-12-27 LAB — CREATININE, URINE, RANDOM
Creatinine, Urine: 33 mg/dL
Creatinine, Urine: 42 mg/dL

## 2019-12-27 LAB — URINALYSIS, COMPLETE (UACMP) WITH MICROSCOPIC
Bacteria, UA: NONE SEEN
Bilirubin Urine: NEGATIVE
Glucose, UA: NEGATIVE mg/dL
Hgb urine dipstick: NEGATIVE
Ketones, ur: 5 mg/dL — AB
Leukocytes,Ua: NEGATIVE
Nitrite: NEGATIVE
Protein, ur: NEGATIVE mg/dL
Specific Gravity, Urine: 1.004 — ABNORMAL LOW (ref 1.005–1.030)
Squamous Epithelial / HPF: NONE SEEN (ref 0–5)
pH: 7 (ref 5.0–8.0)

## 2019-12-27 LAB — IRON AND TIBC
Iron: 57 ug/dL (ref 28–170)
Saturation Ratios: 18 % (ref 10.4–31.8)
TIBC: 318 ug/dL (ref 250–450)
UIBC: 261 ug/dL

## 2019-12-27 LAB — OSMOLALITY, URINE
Osmolality, Ur: 171 mOsm/kg — ABNORMAL LOW (ref 300–900)
Osmolality, Ur: 207 mOsm/kg — ABNORMAL LOW (ref 300–900)

## 2019-12-27 LAB — RETICULOCYTES
Immature Retic Fract: 22 % — ABNORMAL HIGH (ref 2.3–15.9)
RBC.: 3.45 MIL/uL — ABNORMAL LOW (ref 3.87–5.11)
Retic Count, Absolute: 74.2 10*3/uL (ref 19.0–186.0)
Retic Ct Pct: 2.2 % (ref 0.4–3.1)

## 2019-12-27 LAB — SODIUM, URINE, RANDOM
Sodium, Ur: 21 mmol/L
Sodium, Ur: 38 mmol/L

## 2019-12-27 LAB — SODIUM
Sodium: 116 mmol/L — CL (ref 135–145)
Sodium: 117 mmol/L — CL (ref 135–145)
Sodium: 123 mmol/L — ABNORMAL LOW (ref 135–145)

## 2019-12-27 LAB — FERRITIN: Ferritin: 124 ng/mL (ref 11–307)

## 2019-12-27 LAB — FOLATE: Folate: 16 ng/mL (ref >5.9)

## 2019-12-27 LAB — OSMOLALITY: Osmolality: 247 mOsm/kg — CL (ref 275–295)

## 2019-12-27 LAB — TSH: TSH: 2.217 u[IU]/mL (ref 0.350–4.500)

## 2019-12-27 MED ORDER — LEVOTHYROXINE SODIUM 88 MCG PO TABS
88.0000 ug | ORAL_TABLET | Freq: Every day | ORAL | Status: DC
  Administered 2019-12-28 – 2019-12-31 (×4): 88 ug via ORAL
  Filled 2019-12-27 (×7): qty 1

## 2019-12-27 MED ORDER — ACETAMINOPHEN 325 MG PO TABS
650.0000 mg | ORAL_TABLET | Freq: Four times a day (QID) | ORAL | Status: DC | PRN
  Administered 2019-12-28: 650 mg via ORAL
  Filled 2019-12-27: qty 2

## 2019-12-27 MED ORDER — BUDESONIDE 0.5 MG/2ML IN SUSP
0.5000 mg | Freq: Every day | RESPIRATORY_TRACT | Status: DC
  Administered 2019-12-28 – 2019-12-31 (×3): 0.5 mg via RESPIRATORY_TRACT
  Filled 2019-12-27 (×4): qty 2

## 2019-12-27 MED ORDER — ADULT MULTIVITAMIN W/MINERALS CH
1.0000 | ORAL_TABLET | Freq: Every day | ORAL | Status: DC
  Administered 2019-12-27 – 2019-12-31 (×5): 1 via ORAL
  Filled 2019-12-27 (×4): qty 1

## 2019-12-27 MED ORDER — ASPIRIN EC 81 MG PO TBEC
81.0000 mg | DELAYED_RELEASE_TABLET | Freq: Every day | ORAL | Status: DC
  Administered 2019-12-27 – 2020-01-01 (×6): 81 mg via ORAL
  Filled 2019-12-27 (×5): qty 1

## 2019-12-27 MED ORDER — ENOXAPARIN SODIUM 40 MG/0.4ML ~~LOC~~ SOLN
40.0000 mg | SUBCUTANEOUS | Status: DC
  Administered 2019-12-27 – 2019-12-31 (×6): 40 mg via SUBCUTANEOUS
  Filled 2019-12-27 (×6): qty 0.4

## 2019-12-27 MED ORDER — HYDROXYZINE HCL 25 MG PO TABS
25.0000 mg | ORAL_TABLET | Freq: Three times a day (TID) | ORAL | Status: DC | PRN
  Administered 2019-12-27 – 2019-12-31 (×6): 25 mg via ORAL
  Filled 2019-12-27 (×6): qty 1

## 2019-12-27 MED ORDER — PANTOPRAZOLE SODIUM 40 MG PO TBEC
40.0000 mg | DELAYED_RELEASE_TABLET | Freq: Every day | ORAL | Status: DC
  Administered 2019-12-27 – 2020-01-01 (×6): 40 mg via ORAL
  Filled 2019-12-27 (×6): qty 1

## 2019-12-27 MED ORDER — BUSPIRONE HCL 10 MG PO TABS
10.0000 mg | ORAL_TABLET | Freq: Two times a day (BID) | ORAL | Status: DC
  Administered 2019-12-27 – 2019-12-31 (×10): 10 mg via ORAL
  Filled 2019-12-27 (×2): qty 1
  Filled 2019-12-27: qty 2
  Filled 2019-12-27: qty 1
  Filled 2019-12-27 (×2): qty 2
  Filled 2019-12-27 (×2): qty 1
  Filled 2019-12-27: qty 2
  Filled 2019-12-27: qty 1
  Filled 2019-12-27: qty 2

## 2019-12-27 MED ORDER — ZOLPIDEM TARTRATE 5 MG PO TABS
5.0000 mg | ORAL_TABLET | Freq: Every evening | ORAL | Status: DC | PRN
  Administered 2019-12-27 – 2019-12-29 (×4): 5 mg via ORAL
  Filled 2019-12-27 (×5): qty 1

## 2019-12-27 MED ORDER — ALBUTEROL SULFATE HFA 108 (90 BASE) MCG/ACT IN AERS: 1.0000 | INHALATION_SPRAY | Freq: Four times a day (QID) | RESPIRATORY_TRACT | Status: DC | PRN

## 2019-12-27 MED ORDER — AMLODIPINE BESYLATE 5 MG PO TABS
5.0000 mg | ORAL_TABLET | Freq: Every day | ORAL | Status: DC
  Administered 2019-12-27 – 2019-12-28 (×2): 5 mg via ORAL
  Filled 2019-12-27 (×2): qty 1

## 2019-12-27 MED ORDER — ALBUTEROL SULFATE (2.5 MG/3ML) 0.083% IN NEBU: 2.5000 mg | INHALATION_SOLUTION | Freq: Four times a day (QID) | RESPIRATORY_TRACT | Status: DC | PRN

## 2019-12-27 MED ORDER — SODIUM CHLORIDE 0.9 % IV SOLN: INTRAVENOUS | Status: DC

## 2019-12-27 MED ORDER — ACETAMINOPHEN 650 MG RE SUPP: 650.0000 mg | Freq: Four times a day (QID) | RECTAL | Status: DC | PRN

## 2019-12-27 MED ORDER — MONTELUKAST SODIUM 10 MG PO TABS
10.0000 mg | ORAL_TABLET | Freq: Every day | ORAL | Status: DC
  Administered 2019-12-27 – 2020-01-01 (×6): 10 mg via ORAL
  Filled 2019-12-27 (×6): qty 1

## 2019-12-27 MED ORDER — ONDANSETRON HCL 4 MG/2ML IJ SOLN
4.0000 mg | Freq: Four times a day (QID) | INTRAMUSCULAR | Status: DC | PRN
  Administered 2019-12-31: 4 mg via INTRAVENOUS
  Filled 2019-12-27: qty 2

## 2019-12-27 MED ORDER — DIPHENHYDRAMINE HCL 25 MG PO CAPS
25.0000 mg | ORAL_CAPSULE | Freq: Four times a day (QID) | ORAL | Status: DC | PRN
  Administered 2019-12-27 – 2019-12-29 (×4): 25 mg via ORAL
  Filled 2019-12-27 (×4): qty 1

## 2019-12-27 MED ORDER — DOXYCYCLINE HYCLATE 100 MG PO TABS
100.0000 mg | ORAL_TABLET | Freq: Two times a day (BID) | ORAL | Status: DC
  Administered 2019-12-27 – 2020-01-01 (×11): 100 mg via ORAL
  Filled 2019-12-27 (×11): qty 1

## 2019-12-27 MED ORDER — PRAVASTATIN SODIUM 40 MG PO TABS
40.0000 mg | ORAL_TABLET | Freq: Every day | ORAL | Status: DC
  Administered 2019-12-28 – 2019-12-31 (×4): 40 mg via ORAL
  Filled 2019-12-27 (×6): qty 1

## 2019-12-27 MED ORDER — ONDANSETRON HCL 4 MG PO TABS: 4.0000 mg | ORAL_TABLET | Freq: Four times a day (QID) | ORAL | Status: DC | PRN

## 2019-12-27 NOTE — H&P (Addendum)
History and Physical    RICKELL WIEHE KPT:[id number] DOB: July 30, 1934 DOA: 12/26/2019  PCP: Derinda Late, MD   Patient coming from: Home  I have personally briefly reviewed patient's old medical records in [place]  Chief Complaint: Weakness, shortness of breath  HPI: Cynthia Mahoney is a 85 y.o. female with medical history significant for hypertension, glaucoma, with complaints of shortness of breath for the past several weeks, failing outpatient Bactrim treatment by PCP and referred to pulmonology who she saw on 12/24/2019 and prescribed Levaquin now presents to the emergency room with protracted weakness and continued shortness of breath.  She denies chest pain, fever or chills.  Denies unintentional weight loss.  ED Course: On arrival, she is mildly tachypneic but with otherwise normal vitals.  WBC elevated at 13,000, hemoglobin 10.8.  Venous blood gas showed normal pH of 7.43 with decreased PCO2 of 39.  Blood work most notable for sodium of 125, down from 125 at her doctor's office on 12/19/2019.  Creatinine was 1.05 which is about her baseline.  Chest x-ray showed no acute process.  Patient given a normal saline bolus.  Hospitalist consulted for admission.  Review of Systems: As per HPI otherwise all other systems on review of systems negative.    Past Medical History:  Diagnosis Date  . Anxiety   . Arthritis   . Asthma   . GERD (gastroesophageal reflux disease)   . Headache(784.0)    stress  . Hypertension   . Hypothyroidism   . PONV (postoperative nausea and vomiting)     Past Surgical History:  Procedure Laterality Date  . [place] Bilateral 2008, 2013   knee  . SHOULDER ARTHROSCOPY WITH OPEN ROTATOR CUFF REPAIR Right 04/2012  . TONSILLECTOMY       reports that she has never smoked. She has never used smokeless tobacco. She reports that she does not drink alcohol and does not use  drugs.  Allergies  Allergen Reactions  . Amoxicillin Hives and Rash  . Erythromycin Hives and Rash    Family History  Problem Relation Age of Onset  . Breast cancer Neg Hx       Prior to Admission medications   Medication Sig Start Date End Date Taking? Authorizing Provider  aspirin 81 MG tablet Take 81 mg by mouth daily.    [provider]  calcium carbonate (OS-CAL) 600 MG TABS tablet Take 600 mg by mouth daily.    [provider]  ciprofloxacin (CIPRO) 500 MG tablet Take 1 tablet (500 mg total) by mouth 2 (two) times daily. 03/09/17   Johnn Hai, PA-C  dorzolamide-timolol (COSOPT) 22.3-6.8 MG/ML ophthalmic solution Place 1 drop into both eyes 2 (two) times daily.    [provider]  Doxylamine Succinate, Sleep, (SLEEP AID PO) Take 1 tablet by mouth at bedtime.    [provider]  gabapentin (NEURONTIN) 300 MG capsule Take 300-600 mg by mouth 2 (two) times daily. Takes 600 mg in the morning and 300 mg in the evening    [provider]  latanoprost (XALATAN) 0.005 % ophthalmic solution Place 1 drop into both eyes at bedtime.    [provider]  levothyroxine (SYNTHROID, LEVOTHROID) 88 MCG tablet Take 88 mcg by mouth daily before breakfast.    [provider]  losartan-hydrochlorothiazide (HYZAAR) 50-12.5 MG tablet Take 1 tablet by mouth daily. 12/23/16   [provider]  magnesium oxide (  MAG-OX) 400 MG tablet Take 400 mg by mouth daily.    [provider]  meloxicam (MOBIC) 7.5 MG tablet Take 1 tablet by mouth 2 (two) times daily. 12/23/16   [provider]  metoprolol tartrate (LOPRESSOR) 25 MG tablet Take 25 mg by mouth 2 (two) times daily.    [provider]  montelukast (SINGULAIR) 10 MG tablet Take 1 tablet by mouth daily. 12/02/16   [provider]  Multiple Vitamin (MULTIVITAMIN WITH MINERALS) TABS tablet Take 1 tablet by mouth daily.    [provider]   Multiple Vitamins-Minerals (VISION FORMULA/LUTEIN PO) Take 1 tablet by mouth daily.    [provider]  omeprazole (PRILOSEC) 20 MG capsule Take 20 mg by mouth daily.    [provider]  potassium chloride (K-DUR) 10 MEQ tablet Take 10 mEq by mouth daily.    [provider]  pravastatin (PRAVACHOL) 40 MG tablet Take 40 mg by mouth at bedtime.     [provider]  triamterene-hydrochlorothiazide (DYAZIDE) 37.5-25 MG per capsule Take 1 capsule by mouth every morning.    [provider]    Physical Exam: Vitals:   12/26/19 2134 12/26/19 2137 12/26/19 2141 12/26/19 2319  BP:   (!) 166/77 (!) 154/70  Pulse:  76 74 76  Resp:  (!) 22 (!) 28 (!) 21  Temp:  98 F (36.7 C)    TempSrc:  Oral    SpO2:  99% 99% 98%  Weight: 87.1 kg     Height: 5\' 1"  (1.549 m)        Vitals:   12/26/19 2134 12/26/19 2137 12/26/19 2141 12/26/19 2319  BP:   (!) 166/77 (!) 154/70  Pulse:  76 74 76  Resp:  (!) 22 (!) 28 (!) 21  Temp:  98 F (36.7 C)    TempSrc:  Oral    SpO2:  99% 99% 98%  Weight: 87.1 kg     Height: 5\' 1"  (1.549 m)         Constitutional: Ill-appearing. Not in any apparent distress HEENT:      Head: Normocephalic and atraumatic.         Eyes: PERLA, EOMI, Conjunctivae are normal. Sclera is non-icteric.       Mouth/Throat: Mucous membranes are moist.       Neck: Supple with no signs of meningismus. Cardiovascular: Regular rate and rhythm. No murmurs, gallops, or rubs. 2+ symmetrical distal pulses are present . No JVD. No LE edema Respiratory: Respiratory effort normal .Lungs sounds clear bilaterally. No wheezes, crackles, or rhonchi.  Gastrointestinal: Soft, non tender, and non distended with positive bowel sounds. No rebound or guarding. Genitourinary: No CVA tenderness. Musculoskeletal: Nontender with normal range of motion in all extremities. No cyanosis, or erythema of extremities. Neurologic: Normal speech and language. Face is  symmetric. Moving all extremities. No gross focal neurologic deficits . Skin: Skin is warm, dry.  No rash or ulcers Psychiatric: Mood and affect are normal Speech and behavior are normal   Labs on Admission: I have personally reviewed following labs and imaging studies  CBC: Recent Labs  Lab 12/26/19 2226  WBC 13.1*  NEUTROABS 9.9*  HGB 10.8*  HCT 29.5*  MCV 89.1  PLT 161*   Basic Metabolic Panel: Recent Labs  Lab 12/26/19 2226  NA 115*  K 3.3*  CL 79*  CO2 24  GLUCOSE 133*  BUN 14  CREATININE 1.05*  CALCIUM 8.6*   GFR: Estimated Creatinine Clearance: 41.4 mL/min (  A) (by C-G formula based on SCr of 1.05 mg/dL (H)). Liver Function Tests: Recent Labs  Lab 12/26/19 2226  AST 22  ALT 14  ALKPHOS 100  BILITOT 0.7  PROT 6.9  ALBUMIN 3.3*   No results for input(s): LIPASE, AMYLASE in the last 168 hours. No results for input(s): AMMONIA in the last 168 hours. Coagulation Profile: No results for input(s): INR, PROTIME in the last 168 hours. Cardiac Enzymes: No results for input(s): CKTOTAL, CKMB, CKMBINDEX, TROPONINI in the last 168 hours. BNP (last 3 results) No results for input(s): PROBNP in the last 8760 hours. HbA1C: No results for input(s): HGBA1C in the last 72 hours. CBG: No results for input(s): GLUCAP in the last 168 hours. Lipid Profile: No results for input(s): CHOL, HDL, LDLCALC, TRIG, CHOLHDL, LDLDIRECT in the last 72 hours. Thyroid Function Tests: No results for input(s): TSH, T4TOTAL, FREET4, T3FREE, THYROIDAB in the last 72 hours. Anemia Panel: No results for input(s): VITAMINB12, FOLATE, FERRITIN, TIBC, IRON, RETICCTPCT in the last 72 hours. Urine analysis:    Component Value Date/Time   COLORURINE YELLOW (A) 03/09/2017 0837   APPEARANCEUR HAZY (A) 03/09/2017 0837   APPEARANCEUR Hazy 12/26/2011 1450   LABSPEC 1.014 03/09/2017 0837   LABSPEC 1.016 12/26/2011 1450   PHURINE 5.0 03/09/2017 0837   GLUCOSEU NEGATIVE 03/09/2017 0837    GLUCOSEU Negative 12/26/2011 1450   HGBUR NEGATIVE 03/09/2017 0837   BILIRUBINUR NEGATIVE 03/09/2017 0837   BILIRUBINUR Negative 12/26/2011 1450   KETONESUR NEGATIVE 03/09/2017 0837   PROTEINUR NEGATIVE 03/09/2017 0837   NITRITE NEGATIVE 03/09/2017 0837   LEUKOCYTESUR LARGE (A) 03/09/2017 0837   LEUKOCYTESUR 3+ 12/26/2011 1450    Radiological Exams on Admission: DG Chest Portable 1 View  Result Date: 12/26/2019 CLINICAL DATA:  Shortness of breath EXAM: PORTABLE CHEST 1 VIEW COMPARISON:  12/28/2016 FINDINGS: Heart and mediastinal contours are within normal limits. No focal opacities or effusions. No acute bony abnormality. Degenerative changes in the shoulders bilaterally. IMPRESSION: No active cardiopulmonary disease. Electronically Signed   By: Rolm Baptise M.D.   On: 12/26/2019 22:31    EKG: Independently reviewed. Interpretation : Normal sinus rhythm with a few PVCs  Assessment/Plan 85 year old female with hypertension, glaucoma, with complaints of shortness of breath for the past several weeks, not responding to outpatient treatment with Bactrim and prednisone, presenting with weakness and continued shortness of breath. Na 115    Hyponatremia, symptomatic -Sodium 115, symptomatic for shortness of breath and weakness -Etiology uncertain.  Multiple diuretics seen on home med list.  Possibly hypovolemic but suspect SIADH -Follow urine and serum osmolality and urine sodium -Given shortness of breath CT chest with contrast to evaluate for underlying occult process -Follow serum and urine osmolality and urine sodium -Continue initial normal saline with plans for correction of 8 mEq in 24 hours -Nephrology consult   Shortness of breath -Patient has leukocytosis without acute chest x-ray findings, and is afebrile.  Mildly tachypneic but with normal VBG -Evaluated by pulmonology on 12/24/2019 -Patient had completed a 30-day course of Bactrim and prednisone prior to her visit with  pulmonology who switched her to levaquin -No acute findings on chest x-ray -We will hold off on antibiotics for now.  Duo nebs as needed -Consider inpatient pulmonology consult    HTN (hypertension) -Continue home antihypertensives except for diuretics    Hypothyroidism -Continue levothyroxine    DVT prophylaxis: Lovenox  Code Status: full code  Family Communication:  none  Disposition Plan: Back to previous home environment Consults  called: Nephrology Status: Observation    Athena Masse MD [facility]     12/27/2019, 12:03 AM

## 2019-12-27 NOTE — ED Notes (Signed)
Pt resting in hospital bed with visitor at bedside. Lights dimmed for comfort. Pt has nasal cannula on 3 liters oxygen.

## 2019-12-27 NOTE — Consult Note (Signed)
Cynthia Mahoney MRN: [id number] DOB/AGE: 01/19/1935 85 y.o. Primary Care Physician:Babaoff, Beverely Low, MD Admit date: 12/26/2019 Chief Complaint:  Chief Complaint  Patient presents with  . Shortness of Breath   HPI: Patient is 85 year old Caucasian female with a past medical history of hypertension who came to the ER with chief complaint of shortness of breath and weakness.  History of present illness dates back to August 4 when patient came to the ER after a fall.  Patient was evaluated in the ER for laceration repair and was discharged home.  Patient main complaint in today's visit was it has been downhill ever since.  On further questioning patient correlates that when she went home she was not able to eat and she started feeling weak patient weakness was progressive in nature and that is the reason she came to the ER today. Patient also gives a history that she was indeed taking her medications as prescribed.  Patient did inform me that she had cough for which she was prescribed Bactrim and then she was started on Levaquin.   Patient states her cough is now better. Patient also complains of shortness of breath which increases on walking/exercise No complaint of fever No complaint of nausea vomiting diarrhea No complaint of abdominal pain No complaint of increased frequency urgency or dysuria No complaint of hematuria  Past Medical History:  Diagnosis Date  . Anxiety   . Arthritis   . Asthma   . GERD (gastroesophageal reflux disease)   . Headache(784.0)    stress  . Hypertension   . Hypothyroidism   . PONV (postoperative nausea and vomiting)         Family History  Problem Relation Age of Onset  . Breast cancer Neg Hx     Social History:  reports that she has never smoked. She has never used smokeless tobacco. She reports that she does not drink alcohol and does not use drugs.   Allergies:  Allergies  Allergen Reactions  . Amoxicillin Hives and Rash  . Erythromycin  Hives and Rash    (Not in a hospital admission)      ZJI:RCVEL from the symptoms mentioned above,there are no other symptoms referable to all systems reviewed.  Marland Kitchen aspirin EC  81 mg Oral Daily  . budesonide  0.5 mg Nebulization Daily  . busPIRone  10 mg Oral BID  . doxycycline  100 mg Oral Q12H  . enoxaparin (LOVENOX) injection  40 mg Subcutaneous Q24H  . [START ON 12/28/2019] levothyroxine  88 mcg Oral QAC breakfast  . montelukast  10 mg Oral Daily  . multivitamin with minerals  1 tablet Oral Daily  . pantoprazole  40 mg Oral Daily  . pravastatin  40 mg Oral QHS         FYB:OFBPZ from the symptoms mentioned above,there are no other symptoms referable to all systems reviewed.  Physical Exam: Vital signs in last 24 hours: Temp:  [98 F (36.7 C)] 98 F (36.7 C) (08/13 2137) Pulse Rate:  [73-101] 91 (08/14 1106) Resp:  [13-28] 16 (08/14 1106) BP: (138-170)/(65-89) 151/89 (08/14 1100) SpO2:  [94 %-99 %] 96 % (08/14 1106) Weight:  [87.1 kg] 87.1 kg (08/13 2134) Weight change:     Intake/Output from previous day: 08/13 0701 - 08/14 0700 In: 577.5 [I.V.:577.5] Out: -  No intake/output data recorded.   Physical Exam: General- pt is awake,alert, oriented to time place and person Resp- No acute REsp distress, CTA B/L NO Rhonchi CVS- S1S2 regular  in rate and rhythm GIT- BS+, soft, NT, ND EXT- NO LE Edema, Cyanosis CNS- CN 2-12 grossly intact. Moving all 4 extremities Psych- normal mood and affect   Lab Results: CBC Recent Labs    12/26/19 2226  WBC 13.1*  HGB 10.8*  HCT 29.5*  PLT 465*    BMET Recent Labs    12/26/19 2226 12/26/19 2226 12/27/19 0424 12/27/19 0425  NA 115*   < > 116* 117*  K 3.3*  --   --   --   CL 79*  --   --   --   CO2 24  --   --   --   GLUCOSE 133*  --   --   --   BUN 14  --   --   --   CREATININE 1.05*  --   --   --   CALCIUM 8.6*  --   --   --    < > = values in this interval not displayed.    Creatinine trend 2021  1.05 2018 1.07--1.3  MICRO Recent Results (from the past 240 hour(s))  SARS Coronavirus 2 by RT PCR (hospital order, performed in [facility] hospital lab) Nasopharyngeal Nasopharyngeal Swab     Status: None   Collection Time: 12/26/19 10:26 PM   Specimen: Nasopharyngeal Swab  Result Value Ref Range Status   SARS Coronavirus 2 NEGATIVE NEGATIVE Final    Comment: (NOTE) SARS-CoV-2 target nucleic acids are NOT DETECTED.  The SARS-CoV-2 RNA is generally detectable in upper and lower respiratory specimens during the acute phase of infection. The lowest concentration of SARS-CoV-2 viral copies this assay can detect is 250 copies / mL. A negative result does not preclude SARS-CoV-2 infection and should not be used as the sole basis for treatment or other patient management decisions.  A negative result may occur with improper specimen collection / handling, submission of specimen other than nasopharyngeal swab, presence of viral mutation(s) within the areas targeted by this assay, and inadequate number of viral copies (<250 copies / mL). A negative result must be combined with clinical observations, patient history, and epidemiological information.  Fact Sheet for Patients:   StrictlyIdeas.no  Fact Sheet for Healthcare Providers: BankingDealers.co.za  This test is not yet approved or  cleared by the [place] FDA and has been authorized for detection and/or diagnosis of SARS-CoV-2 by FDA under an Emergency Use Authorization (EUA).  This EUA will remain in effect (meaning this test can be used) for the duration of the COVID-19 declaration under Section 564(b)(1) of the Act, 21 U.S.C. section 360bbb-3(b)(1), unless the authorization is terminated or revoked sooner.  Performed at [facility], [address] [place], [place] [address]       Lab Results  Component Value Date   CALCIUM 8.6 (L) 12/26/2019       Impression:   Patient is 85 year old Caucasian female with a past medical history of hypertension, glaucoma, CKD stage IIIa, hypothyroidism who was  admitted with chief complaint of weakness Shortness of breath Hyponatremia   1)Renal  CKD stage 3A.               CKD since 2018               CKD secondary to hypertension.                Patient CKD has some contribution from age associated decline as well as patient is 85 years old  Progression of CKD slow                Proteinura will check.   2)HTN Blood pressure is at goal for the acute state and patient's age  68)Anemia HGb at goal (9--11) We will check anemia profile  4) hyponatremia  etiology-most likely multifactorial Hypovolemic hyponatremia versus iatrogenic versus SIADH  Data in favor of hypovolemic hyponatremia Decreased p.o. intake as per history No edema as per exam  Data in favor of iatrogenic Patient's outpatient medications were reviewed of note patient was on losartan/hydrochlorothiazide combination Hyzaar 50/12.5 mg p.o. daily Patient was on meloxicam 7.5 mg 2 tablets p.o. daily Patient was on triamterene hydrochlorothiazide combination 37.5/25 mg p.o. daily  Patient was on 2 different kinds of hydrochlorothiazide and on NSAIDs  Data in favor of SIADH Patient has respiratory symptoms which can increase  ADH secretion   Data in favor of hypovolemic hyponatremia as patient sodium is improving with normal saline We will continue the current IV fluids  5) shortness of breath Primary team and pulmonology following   6) hypokalemia Being repleted   7)Acid base Co2 at goal     Plan:  Agree with holding hydrochlorothiazide Agree with IV fluids We will ask for urine sodium We will ask for anemia profile Agree with IV fluids Agree with every 4 sodium checks Aim is to correct sodium at less than 8 mEq in 24 hours No need of 3% saline We will continue to  follow     Blossom Crume s Nyliah Nierenberg 12/27/2019, 1:26 PM

## 2019-12-27 NOTE — Progress Notes (Signed)
I was able to reach the Charge Nurse in an effort to get Sodium drawn. After reviewing chart I saw only urine Sodium was done. I placed Stat order for Serum Sodium. I communicated this with RN on the case as well.

## 2019-12-27 NOTE — ED Notes (Signed)
This RN and Parks Ranger transitioned pt onto hospital bed.

## 2019-12-27 NOTE — ED Notes (Signed)
Pt given meal tray.

## 2019-12-27 NOTE — Progress Notes (Signed)
I checked on the pt Sodium multiple times. I was unable to get any information. I then reached out to the RN on the case. Discussion with RN is under the secure chat. I have implored the importance of checking Sodium to the RN on the case. Orders are already in. Primary team was also on the secure chart. Will continue to follow.

## 2019-12-27 NOTE — ED Notes (Signed)
Pt very anxious about not being able to sleep and stating she will not be able to get well if she can not sleep. Pt requesting medication even with the time of morning.

## 2019-12-27 NOTE — ED Notes (Signed)
Pt sleeping at this time.

## 2019-12-27 NOTE — ED Notes (Signed)
Attempted to collect urine sample, specimen collection failed due to missing the collection cup. Will apply purewick via female staff

## 2019-12-27 NOTE — Progress Notes (Addendum)
HPI: Cynthia Mahoney is a 85 y.o. female with medical history significant for hypertension, glaucoma, with complaints of shortness of breath for the past several weeks, failing outpatient Bactrim treatment by PCP and referred to pulmonology who she saw on 12/24/2019 and prescribed Levaquin now presents to the emergency room with protracted weakness and continued shortness of breath.   ED Course: On arrival, she is mildly tachypneic but with otherwise normal vitals.  WBC elevated at 13,000, hemoglobin 10.8.  Venous blood gas showed normal pH of 7.43 with decreased PCO2 of 39.  Blood work most notable for sodium of 115, down from 125 at her doctor's office on 12/19/2019.  Creatinine was 1.05 which is about her baseline.  Chest x-ray showed no acute process.  Patient given a normal saline bolus.  Hospitalist consulted for admission.  12/27/19: Seen and examined in the ED.  Reports new diagnosis of asthma 3 months ago at her pulmonologist office.  Independently reviewed chest x-ray done on admission which showed no pulmonary infiltrates or pulmonary edema.  Serum sodium noted 115, improving to 117 this morning with normal saline running at 100 cc/h.  Reports her breathing is mildly improved on nasal cannula 2 L.  Not on oxygen supplementation at baseline.  Continue to hold off HCTZ which will contribute to her hyponatremia.  Nephrology will see in consultation.  Patient was started on po Levaquin outpatient for likely asthma exacerbation on 12/26/2019.  We will switch to doxycycline twice daily x7 days.  Please refer to H&P dictated by my partner Dr. Damita Dunnings on 12/27/2019 for further details of the assessment and plan.

## 2019-12-27 NOTE — ED Notes (Signed)
Pt has continually makes statements about getting sicker since her fall, and how she feels like she won't get better, and that she's worried about not leaving the hospital this time

## 2019-12-28 LAB — CBC WITH DIFFERENTIAL/PLATELET
Abs Immature Granulocytes: 0.44 10*3/uL — ABNORMAL HIGH (ref 0.00–0.07)
Basophils Absolute: 0 10*3/uL (ref 0.0–0.1)
Basophils Relative: 0 %
Eosinophils Absolute: 0 10*3/uL (ref 0.0–0.5)
Eosinophils Relative: 0 %
HCT: 27.3 % — ABNORMAL LOW (ref 36.0–46.0)
Hemoglobin: 10.2 g/dL — ABNORMAL LOW (ref 12.0–15.0)
Immature Granulocytes: 2 %
Lymphocytes Relative: 17 %
Lymphs Abs: 3 10*3/uL (ref 0.7–4.0)
MCH: 32.4 pg (ref 26.0–34.0)
MCHC: 37.4 g/dL — ABNORMAL HIGH (ref 30.0–36.0)
MCV: 86.7 fL (ref 80.0–100.0)
Monocytes Absolute: 1.6 10*3/uL — ABNORMAL HIGH (ref 0.1–1.0)
Monocytes Relative: 9 %
Neutro Abs: 12.9 10*3/uL — ABNORMAL HIGH (ref 1.7–7.7)
Neutrophils Relative %: 72 %
Platelets: 492 10*3/uL — ABNORMAL HIGH (ref 150–400)
RBC: 3.15 MIL/uL — ABNORMAL LOW (ref 3.87–5.11)
RDW: 12.2 % (ref 11.5–15.5)
WBC: 18 10*3/uL — ABNORMAL HIGH (ref 4.0–10.5)
nRBC: 0 % (ref 0.0–0.2)

## 2019-12-28 LAB — BASIC METABOLIC PANEL
Anion gap: 8 (ref 5–15)
BUN: 8 mg/dL (ref 8–23)
CO2: 25 mmol/L (ref 22–32)
Calcium: 8.5 mg/dL — ABNORMAL LOW (ref 8.9–10.3)
Chloride: 91 mmol/L — ABNORMAL LOW (ref 98–111)
Creatinine, Ser: 0.75 mg/dL (ref 0.44–1.00)
GFR calc Af Amer: >60 mL/min (ref >60)
GFR calc non Af Amer: >60 mL/min (ref >60)
Glucose, Bld: 108 mg/dL — ABNORMAL HIGH (ref 70–99)
Potassium: 3 mmol/L — ABNORMAL LOW (ref 3.5–5.1)
Sodium: 124 mmol/L — ABNORMAL LOW (ref 135–145)

## 2019-12-28 LAB — OSMOLALITY
Osmolality: 252 mOsm/kg — ABNORMAL LOW (ref 275–295)
Osmolality: 255 mOsm/kg — ABNORMAL LOW (ref 275–295)

## 2019-12-28 LAB — MAGNESIUM: Magnesium: 1.2 mg/dL — ABNORMAL LOW (ref 1.7–2.4)

## 2019-12-28 LAB — VITAMIN B12: Vitamin B-12: 314 pg/mL (ref 180–914)

## 2019-12-28 LAB — SODIUM: Sodium: 124 mmol/L — ABNORMAL LOW (ref 135–145)

## 2019-12-28 LAB — PROCALCITONIN: Procalcitonin: <0.1 ng/mL

## 2019-12-28 LAB — PHOSPHORUS: Phosphorus: 2.4 mg/dL — ABNORMAL LOW (ref 2.5–4.6)

## 2019-12-28 MED ORDER — SODIUM PHOSPHATES 45 MMOLE/15ML IV SOLN
30.0000 mmol | Freq: Once | INTRAVENOUS | Status: AC
  Administered 2019-12-28: 30 mmol via INTRAVENOUS
  Filled 2019-12-28: qty 10

## 2019-12-28 MED ORDER — MAGNESIUM OXIDE 400 (241.3 MG) MG PO TABS
400.0000 mg | ORAL_TABLET | Freq: Every day | ORAL | Status: DC
  Administered 2019-12-28 – 2019-12-31 (×4): 400 mg via ORAL
  Filled 2019-12-28 (×5): qty 1

## 2019-12-28 MED ORDER — METOPROLOL TARTRATE 25 MG PO TABS
25.0000 mg | ORAL_TABLET | Freq: Two times a day (BID) | ORAL | Status: DC
  Administered 2019-12-28 – 2019-12-30 (×5): 25 mg via ORAL
  Filled 2019-12-28 (×5): qty 1

## 2019-12-28 MED ORDER — BRIMONIDINE TARTRATE 0.2 % OP SOLN
1.0000 [drp] | Freq: Two times a day (BID) | OPHTHALMIC | Status: DC
  Administered 2019-12-28 – 2019-12-31 (×8): 1 [drp] via OPHTHALMIC
  Filled 2019-12-28: qty 5

## 2019-12-28 MED ORDER — POTASSIUM CHLORIDE CRYS ER 20 MEQ PO TBCR
40.0000 meq | EXTENDED_RELEASE_TABLET | Freq: Two times a day (BID) | ORAL | Status: AC
  Administered 2019-12-28 – 2019-12-29 (×4): 40 meq via ORAL
  Filled 2019-12-28 (×4): qty 2

## 2019-12-28 MED ORDER — CALCIUM CARBONATE ANTACID 500 MG PO CHEW
750.0000 mg | CHEWABLE_TABLET | Freq: Every day | ORAL | Status: DC
  Administered 2019-12-28 – 2020-01-01 (×5): 750 mg via ORAL
  Filled 2019-12-28 (×6): qty 4

## 2019-12-28 MED ORDER — MAGNESIUM SULFATE 4 GM/100ML IV SOLN
4.0000 g | Freq: Once | INTRAVENOUS | Status: AC
  Administered 2019-12-28: 4 g via INTRAVENOUS
  Filled 2019-12-28: qty 100

## 2019-12-28 NOTE — ED Notes (Signed)
Son back in room- requesting chest CT for pt. Will notify MD

## 2019-12-28 NOTE — ED Notes (Signed)
Pt c/o nasal "stuffiness," reports her nose has been bothering her since she fell and hit it a week and a half ago.

## 2019-12-28 NOTE — ED Notes (Addendum)
Pt tearful, states visitor went home and said he'd be back but "he lied." Pt states she doesn't want to eat and wants to sleep but has not been able to. Pt provided phone per her request.

## 2019-12-28 NOTE — Progress Notes (Addendum)
PROGRESS NOTE  Cynthia Mahoney ZYS:[id number] DOB: 01/10/1935 DOA: 12/26/2019 PCP: Derinda Late, MD  HPI/Recap of past 24 hours: XNA:TFTDDU Cynthia Kingis a 85 y.o.femalewith medical history significant forhypertension, glaucoma, with complaints of shortness of breath for the past several weeks, failing outpatient Bactrim treatment by PCP and referred to pulmonology who she saw on 12/24/2019 and prescribedLevaquinnow presents to the emergency room with protracted weakness and continued shortness of breath.  ED Course:On arrival, she is mildly tachypneic but with otherwise normal vitals. WBC elevated at 13,000, hemoglobin 10.8. Venous blood gas showed normal pH of 7.43 with decreased PCO2 of 39. Blood work most notable for sodium of 115, down from 125 at her doctor's office on 12/19/2019. Creatinine was 1.05 which is about her baseline. Chest x-ray showed no acute process. Patient given a normal saline bolus. Hospitalist consulted for admission.  12/28/19: Could not sleep last night, denies any headaches, or change in her vision.  Weakness is mildly improving.  Serum sodium uptrending, 124 on normal saline from 115.  Nephrology assisting.  Electrolytes abnormalities that are being repleted.   On 2 L nasal cannula with O2 saturation 98%.  Will obtain a home O2 evaluation for DC planning.     Assessment/Plan: Active Problems:   Hyponatremia   Shortness of breath   HTN (hypertension)   Hypothyroidism    Hypovolemic hyponatremia, symptomatic -Sodium 115, symptomatic for shortness of breath and weakness TSH normal -Possibly secondary to HCTZ, continue to hold -Responded well to normal saline -Serum sodium 124, repeat every 4 hours -Currently on normal saline at 75 cc/h -Appreciate nephrology's assistance.  Acute asthma exacerbation -Presented with dyspnea with minimal exertion -Started on oral steroids and Levaquin outpatient by her pulmonologist -Continue oral steroids,  switched to doxycycline Continue bronchodilators Maintain O2 saturation greater than 92% Obtain home O2 evaluation for DC planning Will need to follow-up with her pulmonologist outpatient.  Acute hypoxic respiratory failure likely secondary to acute asthma exacerbation Not on oxygen supplementation at baseline Currently on 2 L with O2 saturation 98%. Home O2 eval  Leukocytosis, possibly reactive in the setting of oral steroids WBC up trending No clear evidence of lobular pulmonary infiltrates on chest x-ray Continue Levaquin Obtain sputum culture as able, may need to induce sputum culture as needed Procalcitonin less than 0.10    HTN (hypertension) -Resume home antihypertensives except for diuretics due to hyponatremia    Hypothyroidism -Continue levothyroxine    DVT prophylaxis: Lovenox subcu daily Code Status: full code  Family Communication:   Updated her daughter via phone on 12/27/2019   Consults called: Nephrology   Status is: Inpatient    Dispo:  Patient From: Home  Planned Disposition: Home with Health Care Svc  Expected discharge date: 12/29/19  Medically stable for discharge: No, ongoing management of hypovolemic hyponatremia.         Objective: Vitals:   12/28/19 0456 12/28/19 0641 12/28/19 0800 12/28/19 1036  BP: (!) 151/61 (!) 147/67 (!) 165/71 (!) 149/63  Pulse: 92 89 90   Resp: 16 16    Temp:      TempSrc:      SpO2: 100% 99% 98%   Weight:      Height:       No intake or output data in the 24 hours ending 12/28/19 1414 Filed Weights   12/26/19 2134  Weight: 87.1 kg    Exam:  . General: 85 y.o. year-old female well developed well nourished in no acute distress.  Alert and  oriented x3. . Cardiovascular: Regular rate and rhythm with no rubs or gallops.  No thyromegaly or JVD noted.   Marland Kitchen Respiratory: Mild rales at bases no wheezing noted.  Good inspiratory effort.  . Abdomen: Soft nontender nondistended with normal bowel sounds x4  quadrants. . Musculoskeletal: Trace lower extremity edema bilaterally. Marland Kitchen Psychiatry: Mood is appropriate for condition and setting   Data Reviewed: CBC: Recent Labs  Lab 12/26/19 2226 12/28/19 0455  WBC 13.1* 18.0*  NEUTROABS 9.9* 12.9*  HGB 10.8* 10.2*  HCT 29.5* 27.3*  MCV 89.1 86.7  PLT 465* 812*   Basic Metabolic Panel: Recent Labs  Lab 12/26/19 2226 12/27/19 0424 12/27/19 0425 12/27/19 1955 12/28/19 0455  NA 115* 116* 117* 123* 124*  K 3.3*  --   --   --  3.0*  CL 79*  --   --   --  91*  CO2 24  --   --   --  25  GLUCOSE 133*  --   --   --  108*  BUN 14  --   --   --  8  CREATININE 1.05*  --   --   --  0.75  CALCIUM 8.6*  --   --   --  8.5*  MG  --   --   --   --  1.2*  PHOS  --   --   --   --  2.4*   GFR: Estimated Creatinine Clearance: 54.3 mL/min (by C-Cynthia formula based on SCr of 0.75 mg/dL). Liver Function Tests: Recent Labs  Lab 12/26/19 2226  AST 22  ALT 14  ALKPHOS 100  BILITOT 0.7  PROT 6.9  ALBUMIN 3.3*   No results for input(s): LIPASE, AMYLASE in the last 168 hours. No results for input(s): AMMONIA in the last 168 hours. Coagulation Profile: No results for input(s): INR, PROTIME in the last 168 hours. Cardiac Enzymes: No results for input(s): CKTOTAL, CKMB, CKMBINDEX, TROPONINI in the last 168 hours. BNP (last 3 results) No results for input(s): PROBNP in the last 8760 hours. HbA1C: No results for input(s): HGBA1C in the last 72 hours. CBG: No results for input(s): GLUCAP in the last 168 hours. Lipid Profile: No results for input(s): CHOL, HDL, LDLCALC, TRIG, CHOLHDL, LDLDIRECT in the last 72 hours. Thyroid Function Tests: Recent Labs    12/27/19 0426  TSH 2.217   Anemia Panel: Recent Labs    12/27/19 1955  VITAMINB12 314  FOLATE 16.0  FERRITIN 124  TIBC 318  IRON 57  RETICCTPCT 2.2   Urine analysis:    Component Value Date/Time   COLORURINE STRAW (A) 12/27/2019 0426   APPEARANCEUR CLEAR (A) 12/27/2019 0426    APPEARANCEUR Hazy 12/26/2011 1450   LABSPEC 1.004 (L) 12/27/2019 0426   LABSPEC 1.016 12/26/2011 1450   PHURINE 7.0 12/27/2019 0426   GLUCOSEU NEGATIVE 12/27/2019 0426   GLUCOSEU Negative 12/26/2011 1450   HGBUR NEGATIVE 12/27/2019 0426   BILIRUBINUR NEGATIVE 12/27/2019 0426   BILIRUBINUR Negative 12/26/2011 1450   KETONESUR 5 (A) 12/27/2019 0426   PROTEINUR NEGATIVE 12/27/2019 0426   NITRITE NEGATIVE 12/27/2019 0426   LEUKOCYTESUR NEGATIVE 12/27/2019 0426   LEUKOCYTESUR 3+ 12/26/2011 1450   Sepsis Labs: @LABRCNTIP (procalcitonin:4,lacticidven:4)  ) Recent Results (from the past 240 hour(s))  SARS Coronavirus 2 by RT PCR (hospital order, performed in [facility] hospital lab) Nasopharyngeal Nasopharyngeal Swab     Status: None   Collection Time: 12/26/19 10:26 PM   Specimen: Nasopharyngeal Swab  Result  Value Ref Range Status   SARS Coronavirus 2 NEGATIVE NEGATIVE Final    Comment: (NOTE) SARS-CoV-2 target nucleic acids are NOT DETECTED.  The SARS-CoV-2 RNA is generally detectable in upper and lower respiratory specimens during the acute phase of infection. The lowest concentration of SARS-CoV-2 viral copies this assay can detect is 250 copies / mL. A negative result does not preclude SARS-CoV-2 infection and should not be used as the sole basis for treatment or other patient management decisions.  A negative result may occur with improper specimen collection / handling, submission of specimen other than nasopharyngeal swab, presence of viral mutation(s) within the areas targeted by this assay, and inadequate number of viral copies (<250 copies / mL). A negative result must be combined with clinical observations, patient history, and epidemiological information.  Fact Sheet for Patients:   StrictlyIdeas.no  Fact Sheet for Healthcare Providers: BankingDealers.co.za  This test is not yet approved or  cleared by the [place]  FDA and has been authorized for detection and/or diagnosis of SARS-CoV-2 by FDA under an Emergency Use Authorization (EUA).  This EUA will remain in effect (meaning this test can be used) for the duration of the COVID-19 declaration under Section 564(b)(1) of the Act, 21 U.S.C. section 360bbb-3(b)(1), unless the authorization is terminated or revoked sooner.  Performed at [facility], [address], [place], [place] [address]       Studies: No results found.  Scheduled Meds: . amLODipine  5 mg Oral Daily  . aspirin EC  81 mg Oral Daily  . budesonide  0.5 mg Nebulization Daily  . busPIRone  10 mg Oral BID  . doxycycline  100 mg Oral Q12H  . enoxaparin (LOVENOX) injection  40 mg Subcutaneous Q24H  . levothyroxine  88 mcg Oral QAC breakfast  . montelukast  10 mg Oral Daily  . multivitamin with minerals  1 tablet Oral Daily  . pantoprazole  40 mg Oral Daily  . potassium chloride  40 mEq Oral BID  . pravastatin  40 mg Oral QHS    Continuous Infusions: . sodium chloride 75 mL/hr at 12/28/19 0754  . sodium phosphate  Dextrose 5% IVPB 30 mmol (12/28/19 1043)     LOS: 2 days     Kayleen Memos, MD [facility] Pager [phone]  If 7PM-7AM, please contact night-coverage [email or web address] Password [facility] 12/28/2019, 2:14 PM

## 2019-12-28 NOTE — ED Notes (Signed)
Brief changed per pt request- brief is dry and clean.

## 2019-12-28 NOTE — ED Notes (Signed)
Pt sleeping. 

## 2019-12-28 NOTE — ED Notes (Signed)
Patient given wash cloth to wash face

## 2019-12-28 NOTE — ED Notes (Signed)
Pt changed out of wet brief and purewick applied to pt.

## 2019-12-28 NOTE — Progress Notes (Addendum)
Cynthia GIEBLER  MRN: [id number]  DOB/AGE: 10/07/1934 85 y.o.  Primary Care Physician:Babaoff, Beverely Low, MD  Admit date: 12/26/2019  Chief Complaint:  Chief Complaint  Patient presents with  . Shortness of Breath    S-Pt presented on  12/26/2019 with  Chief Complaint  Patient presents with  . Shortness of Breath  . Patient had multiple comments in today's visit.  Patient was initially upset that why she is still in the ER.  I empathized with the patient and informed the patient that currently there are no beds available and as soon as 1 becomes available patient will be moved from the ER.  Patient next major concern was her and inability to move the head of her bed down.  I then reached out to the patient's RN to help the patient with the situation.  Patient next question was what time of the day it was.  I informed the patient that it was Sunday morning .  Patient was understanding.  I then discussed patient sodium trend over past 24-36 hours.  I informed the patient that her sodium has improved.  Patient was understanding  Medications  . amLODipine  5 mg Oral Daily  . aspirin EC  81 mg Oral Daily  . budesonide  0.5 mg Nebulization Daily  . busPIRone  10 mg Oral BID  . doxycycline  100 mg Oral Q12H  . enoxaparin (LOVENOX) injection  40 mg Subcutaneous Q24H  . levothyroxine  88 mcg Oral QAC breakfast  . montelukast  10 mg Oral Daily  . multivitamin with minerals  1 tablet Oral Daily  . pantoprazole  40 mg Oral Daily  . potassium chloride  40 mEq Oral BID  . pravastatin  40 mg Oral QHS         WVP:XTGGY from the symptoms mentioned above,there are no other symptoms referable to all systems reviewed.  Physical Exam: Vital signs in last 24 hours: Pulse Rate:  [83-101] 89 (08/15 0641) Resp:  [16-22] 16 (08/15 0641) BP: (147-162)/(61-89) 147/67 (08/15 0641) SpO2:  [96 %-100 %] 99 % (08/15 0641) Weight change:     Intake/Output from previous day: No intake/output data  recorded. No intake/output data recorded.   Physical Exam: General- pt is awake,alert, oriented to time place and person Resp- No acute REsp distress, CTA B/L NO Rhonchi CVS- S1S2 regular in rate and rhythm GIT- BS+, soft, NT, ND EXT- NO LE Edema, Cyanosis   Lab Results: CBC Recent Labs    12/26/19 2226 12/28/19 0455  WBC 13.1* 18.0*  HGB 10.8* 10.2*  HCT 29.5* 27.3*  PLT 465* 492*    BMET Recent Labs    12/26/19 2226 12/27/19 0424 12/27/19 1955 12/28/19 0455  NA 115*   < > 123* 124*  K 3.3*  --   --  3.0*  CL 79*  --   --  91*  CO2 24  --   --  25  GLUCOSE 133*  --   --  108*  BUN 14  --   --  8  CREATININE 1.05*  --   --  0.75  CALCIUM 8.6*  --   --  8.5*   < > = values in this interval not displayed.    Sodium trend 8.13.21                 12/28/19 at11pm                 5am 115          ==>  124   Creatinine trend 2021 1.05 2018 1.07--1.3   MICRO Recent Results (from the past 240 hour(s))  SARS Coronavirus 2 by RT PCR (hospital order, performed in [facility] hospital lab) Nasopharyngeal Nasopharyngeal Swab     Status: None   Collection Time: 12/26/19 10:26 PM   Specimen: Nasopharyngeal Swab  Result Value Ref Range Status   SARS Coronavirus 2 NEGATIVE NEGATIVE Final    Comment: (NOTE) SARS-CoV-2 target nucleic acids are NOT DETECTED.  The SARS-CoV-2 RNA is generally detectable in upper and lower respiratory specimens during the acute phase of infection. The lowest concentration of SARS-CoV-2 viral copies this assay can detect is 250 copies / mL. A negative result does not preclude SARS-CoV-2 infection and should not be used as the sole basis for treatment or other patient management decisions.  A negative result may occur with improper specimen collection / handling, submission of specimen other than nasopharyngeal swab, presence of viral mutation(s) within the areas targeted by this assay, and inadequate number of viral copies (<250  copies / mL). A negative result must be combined with clinical observations, patient history, and epidemiological information.  Fact Sheet for Patients:   StrictlyIdeas.no  Fact Sheet for Healthcare Providers: BankingDealers.co.za  This test is not yet approved or  cleared by the [place] FDA and has been authorized for detection and/or diagnosis of SARS-CoV-2 by FDA under an Emergency Use Authorization (EUA).  This EUA will remain in effect (meaning this test can be used) for the duration of the COVID-19 declaration under Section 564(b)(1) of the Act, 21 U.S.C. section 360bbb-3(b)(1), unless the authorization is terminated or revoked sooner.  Performed at [facility], [address] [place], [place] [address]       Lab Results  Component Value Date   CALCIUM 8.5 (L) 12/28/2019   PHOS 2.4 (L) 12/28/2019               Impression:   Patient is 85 year old Caucasian female with a past medical history of hypertension, glaucoma, CKD stage IIIa, hypothyroidism who was  admitted with chief complaint of weakness Shortness of breath Hyponatremia   1)Renal  CKD stage 3A.               CKD since 2018               CKD secondary to hypertension.                Patient CKD has some contribution from age associated decline as well as patient is 85 years old                Progression of CKD slow                Proteinura will check.   2)HTN Blood pressure is at goal for the acute state and patient's age  85)Anemia of chronic disease   HGb at goal (9--11) Results for LEAH, THORNBERRY (MRN [id number]) as of 12/28/2019 11:17  Ref. Range 12/27/2019 19:55  Iron Latest Ref Range: 28 - 170 ug/dL 57  UIBC Latest Units: ug/dL 261  TIBC Latest Ref Range: 250 - 450 ug/dL 318  Saturation Ratios Latest Ref Range: 10.4 - 31.8 % 18  Ferritin Latest Ref Range: 11 - 307 ng/mL 124  Folate Latest Ref Range: >5.9 ng/mL  16.0    No need for Epogen for now  4) Hyponatremia  etiology-most likely multifactorial Hypovolemic hyponatremia versus iatrogenic versus SIADH  Data in favor  of hypovolemic hyponatremia Decreased p.o. intake as per history No edema as per exam  Data in favor of iatrogenic Patient's outpatient medications were reviewed of note patient was on losartan/hydrochlorothiazide combination Hyzaar 50/12.5 mg p.o. daily Patient was on meloxicam 7.5 mg 2 tablets p.o. daily Patient was on triamterene hydrochlorothiazide combination 37.5/25 mg p.o. daily  Patient was on 2 different kinds of hydrochlorothiazide and on NSAIDs  Data in favor of SIADH Patient has respiratory symptoms which can increase  ADH secretion   Data in favor of hypovolemic hyponatremia as patient sodium is improving with normal saline   Patient was started on normal saline at the time of admission, patient sodium improved. Patient IV fluids were later changed to hypotonic fluids in an effort to reduce the rate of correction Patient date of correction has been less than 8 mEq in 24 hours-which was the plan  5) shortness of breath Primary team and pulmonology following   6) hypokalemia Being repleted   7)Acid base Co2 at goal     Plan:  We will continue normal saline Aim is to correct sodium at less than 8 mEq in 24 hours No need of 3% saline We will continue to follow      Lakeya Mulka s [facility] 12/28/2019, 7:51 AM

## 2019-12-28 NOTE — ED Notes (Signed)
DO Hall notified of sodium of 124

## 2019-12-28 NOTE — ED Notes (Signed)
Pt is AOX4, NAD observed, respirations even and unlabored. Readjusted in bed per her request. Pt denies pain at this time. Pt has 2 liters on nasal cannula.

## 2019-12-28 NOTE — ED Notes (Signed)
Patient pulled up in bed by this RN and Lucina Mellow

## 2019-12-29 LAB — BASIC METABOLIC PANEL
Anion gap: 8 (ref 5–15)
Anion gap: 10 (ref 5–15)
BUN: 8 mg/dL (ref 8–23)
BUN: 7 mg/dL — ABNORMAL LOW (ref 8–23)
CO2: 23 mmol/L (ref 22–32)
CO2: 21 mmol/L — ABNORMAL LOW (ref 22–32)
Calcium: 8.1 mg/dL — ABNORMAL LOW (ref 8.9–10.3)
Calcium: 8.7 mg/dL — ABNORMAL LOW (ref 8.9–10.3)
Chloride: 94 mmol/L — ABNORMAL LOW (ref 98–111)
Chloride: 91 mmol/L — ABNORMAL LOW (ref 98–111)
Creatinine, Ser: 0.69 mg/dL (ref 0.44–1.00)
Creatinine, Ser: 0.74 mg/dL (ref 0.44–1.00)
GFR calc Af Amer: >60 mL/min (ref >60)
GFR calc Af Amer: >60 mL/min (ref >60)
GFR calc non Af Amer: >60 mL/min (ref >60)
GFR calc non Af Amer: >60 mL/min (ref >60)
Glucose, Bld: 96 mg/dL (ref 70–99)
Glucose, Bld: 117 mg/dL — ABNORMAL HIGH (ref 70–99)
Potassium: 4.1 mmol/L (ref 3.5–5.1)
Potassium: 4.2 mmol/L (ref 3.5–5.1)
Sodium: 125 mmol/L — ABNORMAL LOW (ref 135–145)
Sodium: 122 mmol/L — ABNORMAL LOW (ref 135–145)

## 2019-12-29 LAB — CBC WITH DIFFERENTIAL/PLATELET
Abs Immature Granulocytes: 0.25 10*3/uL — ABNORMAL HIGH (ref 0.00–0.07)
Basophils Absolute: 0.1 10*3/uL (ref 0.0–0.1)
Basophils Relative: 1 %
Eosinophils Absolute: 0.2 10*3/uL (ref 0.0–0.5)
Eosinophils Relative: 2 %
HCT: 28.4 % — ABNORMAL LOW (ref 36.0–46.0)
Hemoglobin: 10 g/dL — ABNORMAL LOW (ref 12.0–15.0)
Immature Granulocytes: 2 %
Lymphocytes Relative: 23 %
Lymphs Abs: 3.1 10*3/uL (ref 0.7–4.0)
MCH: 32.3 pg (ref 26.0–34.0)
MCHC: 35.2 g/dL (ref 30.0–36.0)
MCV: 91.6 fL (ref 80.0–100.0)
Monocytes Absolute: 1.3 10*3/uL — ABNORMAL HIGH (ref 0.1–1.0)
Monocytes Relative: 10 %
Neutro Abs: 8.4 10*3/uL — ABNORMAL HIGH (ref 1.7–7.7)
Neutrophils Relative %: 62 %
Platelets: 472 10*3/uL — ABNORMAL HIGH (ref 150–400)
RBC: 3.1 MIL/uL — ABNORMAL LOW (ref 3.87–5.11)
RDW: 12.4 % (ref 11.5–15.5)
WBC: 13.4 10*3/uL — ABNORMAL HIGH (ref 4.0–10.5)
nRBC: 0 % (ref 0.0–0.2)

## 2019-12-29 MED ORDER — AMLODIPINE BESYLATE 10 MG PO TABS
10.0000 mg | ORAL_TABLET | Freq: Every day | ORAL | Status: DC
  Administered 2019-12-29 – 2020-01-01 (×4): 10 mg via ORAL
  Filled 2019-12-29: qty 1
  Filled 2019-12-29: qty 2
  Filled 2019-12-29 (×2): qty 1

## 2019-12-29 MED ORDER — FLUTICASONE PROPIONATE 50 MCG/ACT NA SUSP
2.0000 | Freq: Every day | NASAL | Status: AC
  Administered 2019-12-30 – 2019-12-31 (×2): 2 via NASAL
  Filled 2019-12-29: qty 16

## 2019-12-29 MED ORDER — GUAIFENESIN 100 MG/5ML PO SOLN
5.0000 mL | ORAL | Status: DC | PRN
  Filled 2019-12-29: qty 5

## 2019-12-29 MED ORDER — BISACODYL 10 MG RE SUPP
10.0000 mg | Freq: Every day | RECTAL | Status: DC | PRN
  Filled 2019-12-29: qty 1

## 2019-12-29 MED ORDER — BISACODYL 5 MG PO TBEC: 5.0000 mg | DELAYED_RELEASE_TABLET | Freq: Every day | ORAL | Status: DC | PRN

## 2019-12-29 MED ORDER — ENSURE ENLIVE PO LIQD
237.0000 mL | Freq: Two times a day (BID) | ORAL | Status: DC
  Administered 2019-12-29 – 2019-12-31 (×3): 237 mL via ORAL

## 2019-12-29 MED ORDER — SENNOSIDES-DOCUSATE SODIUM 8.6-50 MG PO TABS
2.0000 | ORAL_TABLET | Freq: Two times a day (BID) | ORAL | Status: DC
  Administered 2019-12-29 – 2019-12-31 (×4): 2 via ORAL
  Filled 2019-12-29 (×6): qty 2

## 2019-12-29 MED ORDER — POLYETHYLENE GLYCOL 3350 17 G PO PACK
17.0000 g | PACK | Freq: Every day | ORAL | Status: DC
  Administered 2019-12-30 – 2019-12-31 (×2): 17 g via ORAL
  Filled 2019-12-29 (×4): qty 1

## 2019-12-29 NOTE — ED Notes (Signed)
Patient is comfortable. Brief is dry, patient refuses repositioning of purewick. Educated patient, she still refuses and wants to sleep. Will continue to monitor

## 2019-12-29 NOTE — ED Notes (Signed)
Patient likes Vanilla or Strawberry Ensure NO Chocolate

## 2019-12-29 NOTE — ED Notes (Signed)
Patient son Jeneen Rinks at bedside

## 2019-12-29 NOTE — ED Notes (Signed)
Report to jadecka, rn.  

## 2019-12-29 NOTE — ED Notes (Signed)
Pt ringing call bell approx every 10-20 minutes for small requests. Pt repositioned in bed again for comfort.

## 2019-12-29 NOTE — Progress Notes (Signed)
PROGRESS NOTE  Cynthia Mahoney UYQ:[id number] DOB: 1934/06/27 DOA: 12/26/2019 PCP: Derinda Late, MD  HPI/Recap of past 24 hours: GLO:VFIEPP [name] medical history significant forhypertension, glaucoma, with complaints of shortness of breath for the past several weeks, failing outpatient Bactrim treatment by PCP and referred to pulmonology who she saw on 12/24/2019 and prescribedLevaquinnow presents to the emergency room with protracted weakness and continued shortness of breath.  ED Course:On arrival, she is mildly tachypneic but with otherwise normal vitals. WBC elevated at 13,000, hemoglobin 10.8. Venous blood gas showed normal pH of 7.43 with decreased PCO2 of 39. Blood work most notable for sodium of 115, down from 125 at her doctor's office on 12/19/2019. Creatinine was 1.05 which is about her baseline. Chest x-ray showed no acute process. Patient given a normal saline bolus. Hospitalist consulted for admission.  12/29/19: Could not sleep last night, denies any headaches, or change in her vision.  Serum sodium is up trending 125 from 115 on presentation.   Assessment/Plan: Active Problems:   Hyponatremia   Shortness of breath   HTN (hypertension)   Hypothyroidism   Hypovolemic hyponatremia, symptomatic -Presented with sodium 115, symptomatic for shortness of breath and weakness TSH normal -Possibly secondary to HCTZ, continue to hold -Responded well to normal saline -Serum sodium 125, repeat every 4 hours -Currently on normal saline at 75 cc/h -Appreciate nephrology's assistance.  Acute asthma exacerbation -Presented with dyspnea with minimal exertion -Started on oral steroids and Levaquin outpatient by her pulmonologist -Continue oral steroids, switched to doxycycline Continue bronchodilators Maintain O2 saturation greater than 92% Obtain home O2 evaluation for DC planning Will need to follow-up with her pulmonologist  outpatient.  Acute hypoxic respiratory failure likely secondary to acute asthma exacerbation Not on oxygen supplementation at baseline Currently on 2 L with O2 saturation 98%. Home O2 eval  Improving leukocytosis, possibly reactive in the setting of oral steroids WBC up trending No clear evidence of lobular pulmonary infiltrates on chest x-ray Continue doxycycline Procalcitonin less than 0.10  HTN (hypertension) BP stable Continue home antihypertensives except for diuretics due to hyponatremia  Hypothyroidism TSH 2.2 -Continue levothyroxine    DVT prophylaxis: Lovenox subcu daily Code Status: full code  Family Communication:   Updated her son at bedside.   Consults called: Nephrology   Status is: Inpatient    Dispo:  Patient From: Home  Planned Disposition: Home with Health Care Svc  Expected discharge date: 12/30/19  Medically stable for discharge: No, ongoing management of hypovolemic hyponatremia.         Objective: Vitals:   12/29/19 0130 12/29/19 0500 12/29/19 0530 12/29/19 1024  BP: (!) 141/72 (!) 143/58 (!) 168/99 (!) 169/87  Pulse: 79 80 81 84  Resp: 19 17 19    Temp:      TempSrc:      SpO2: 97% 97% 96%   Weight:      Height:        Intake/Output Summary (Last 24 hours) at 12/29/2019 1714 Last data filed at 12/28/2019 2138 Gross per 24 hour  Intake --  Output 1200 ml  Net -1200 ml   Filed Weights   12/26/19 2134  Weight: 87.1 kg    Exam:  . General: 85 y.o. year-old female Well developed well nourished in no acute distress.  Alert and oriented x 3. .  Cardiovascular: Regular rate and rhythm no rubs or gallops   . Respiratory: Improved rales at bases . Abdomen: Soft and non tender with no  bowel sounds present . Musculoskeletal: Trace lower extremity edema . Psychiatry: Mood is appropriate   Data Reviewed: CBC: Recent Labs  Lab 12/26/19 2226 12/28/19 0455 12/29/19 0404  WBC 13.1* 18.0* 13.4*  NEUTROABS 9.9* 12.9* 8.4*  HGB  10.8* 10.2* 10.0*  HCT 29.5* 27.3* 28.4*  MCV 89.1 86.7 91.6  PLT 465* 492* 295*   Basic Metabolic Panel: Recent Labs  Lab 12/26/19 2226 12/27/19 0424 12/27/19 0425 12/27/19 1955 12/28/19 0455 12/28/19 1613 12/29/19 0404  NA 115*   < > 117* 123* 124* 124* 125*  K 3.3*  --   --   --  3.0*  --  4.1  CL 79*  --   --   --  91*  --  94*  CO2 24  --   --   --  25  --  23  GLUCOSE 133*  --   --   --  108*  --  96  BUN 14  --   --   --  8  --  8  CREATININE 1.05*  --   --   --  0.75  --  0.69  CALCIUM 8.6*  --   --   --  8.5*  --  8.1*  MG  --   --   --   --  1.2*  --   --   PHOS  --   --   --   --  2.4*  --   --    < > = values in this interval not displayed.   GFR: Estimated Creatinine Clearance: 54.3 mL/min (by C-G formula based on SCr of 0.69 mg/dL). Liver Function Tests: Recent Labs  Lab 12/26/19 2226  AST 22  ALT 14  ALKPHOS 100  BILITOT 0.7  PROT 6.9  ALBUMIN 3.3*   No results for input(s): LIPASE, AMYLASE in the last 168 hours. No results for input(s): AMMONIA in the last 168 hours. Coagulation Profile: No results for input(s): INR, PROTIME in the last 168 hours. Cardiac Enzymes: No results for input(s): CKTOTAL, CKMB, CKMBINDEX, TROPONINI in the last 168 hours. BNP (last 3 results) No results for input(s): PROBNP in the last 8760 hours. HbA1C: No results for input(s): HGBA1C in the last 72 hours. CBG: No results for input(s): GLUCAP in the last 168 hours. Lipid Profile: No results for input(s): CHOL, HDL, LDLCALC, TRIG, CHOLHDL, LDLDIRECT in the last 72 hours. Thyroid Function Tests: Recent Labs    12/27/19 0426  TSH 2.217   Anemia Panel: Recent Labs    12/27/19 1955  VITAMINB12 314  FOLATE 16.0  FERRITIN 124  TIBC 318  IRON 57  RETICCTPCT 2.2   Urine analysis:    Component Value Date/Time   COLORURINE STRAW (A) 12/27/2019 0426   APPEARANCEUR CLEAR (A) 12/27/2019 0426   APPEARANCEUR Hazy 12/26/2011 1450   LABSPEC 1.004 (L) 12/27/2019 0426    LABSPEC 1.016 12/26/2011 1450   PHURINE 7.0 12/27/2019 0426   GLUCOSEU NEGATIVE 12/27/2019 0426   GLUCOSEU Negative 12/26/2011 1450   HGBUR NEGATIVE 12/27/2019 0426   BILIRUBINUR NEGATIVE 12/27/2019 0426   BILIRUBINUR Negative 12/26/2011 1450   KETONESUR 5 (A) 12/27/2019 0426   PROTEINUR NEGATIVE 12/27/2019 0426   NITRITE NEGATIVE 12/27/2019 0426   LEUKOCYTESUR NEGATIVE 12/27/2019 0426   LEUKOCYTESUR 3+ 12/26/2011 1450   Sepsis Labs: @LABRCNTIP (procalcitonin:4,lacticidven:4)  ) Recent Results (from the past 240 hour(s))  SARS Coronavirus 2 by RT PCR (hospital order, performed in [facility] hospital lab) Nasopharyngeal Nasopharyngeal Swab  Status: None   Collection Time: 12/26/19 10:26 PM   Specimen: Nasopharyngeal Swab  Result Value Ref Range Status   SARS Coronavirus 2 NEGATIVE NEGATIVE Final    Comment: (NOTE) SARS-CoV-2 target nucleic acids are NOT DETECTED.  The SARS-CoV-2 RNA is generally detectable in upper and lower respiratory specimens during the acute phase of infection. The lowest concentration of SARS-CoV-2 viral copies this assay can detect is 250 copies / mL. A negative result does not preclude SARS-CoV-2 infection and should not be used as the sole basis for treatment or other patient management decisions.  A negative result may occur with improper specimen collection / handling, submission of specimen other than nasopharyngeal swab, presence of viral mutation(s) within the areas targeted by this assay, and inadequate number of viral copies (<250 copies / mL). A negative result must be combined with clinical observations, patient history, and epidemiological information.  Fact Sheet for Patients:   StrictlyIdeas.no  Fact Sheet for Healthcare Providers: BankingDealers.co.za  This test is not yet approved or  cleared by the [place] FDA and has been authorized for detection and/or diagnosis of  SARS-CoV-2 by FDA under an Emergency Use Authorization (EUA).  This EUA will remain in effect (meaning this test can be used) for the duration of the COVID-19 declaration under Section 564(b)(1) of the Act, 21 U.S.C. section 360bbb-3(b)(1), unless the authorization is terminated or revoked sooner.  Performed at [facility], [address] [place], [place] [address]       Studies: No results found.  Scheduled Meds: . amLODipine  10 mg Oral Daily  . aspirin EC  81 mg Oral Daily  . brimonidine  1 drop Both Eyes BID  . budesonide  0.5 mg Nebulization Daily  . busPIRone  10 mg Oral BID  . calcium carbonate  750 mg Oral Daily  . doxycycline  100 mg Oral Q12H  . enoxaparin (LOVENOX) injection  40 mg Subcutaneous Q24H  . feeding supplement (ENSURE ENLIVE)  237 mL Oral BID BM  . fluticasone  2 spray Each Nare Daily  . levothyroxine  88 mcg Oral QAC breakfast  . magnesium oxide  400 mg Oral Daily  . metoprolol tartrate  25 mg Oral BID  . montelukast  10 mg Oral Daily  . multivitamin with minerals  1 tablet Oral Daily  . pantoprazole  40 mg Oral Daily  . polyethylene glycol  17 g Oral Daily  . potassium chloride  40 mEq Oral BID  . pravastatin  40 mg Oral QHS  . senna-docusate  2 tablet Oral BID    Continuous Infusions: . sodium chloride 75 mL/hr at 12/29/19 0403     LOS: 3 days     Kayleen Memos, MD [facility] Pager [phone]  If 7PM-7AM, please contact night-coverage [email or web address] Password [facility] 12/29/2019, 5:14 PM

## 2019-12-29 NOTE — ED Notes (Signed)
Waiting on synthroid from pharmacy.  

## 2019-12-29 NOTE — ED Notes (Signed)
Lab notified of need for venipuncture assist for 0500 labs. 

## 2019-12-29 NOTE — ED Notes (Signed)
Pt repositioned in bed.

## 2019-12-29 NOTE — ED Notes (Signed)
Patient is resting comfortably at this time, was very tearful and agitated earlier about not being able to rest all day. Soothed patient, told her I would provide her meds slightly early so she could try to rest overnight. Refusing pericare and repositioning at this time

## 2019-12-29 NOTE — TOC Initial Note (Signed)
Transition of Care [facility]) - Initial/Assessment Note    Patient Details  Name: Cynthia Mahoney MRN: [id number] Date of Birth: 1934-10-26  [facility] CM/SW Contact:    Ova Freshwater Phone Number: [phone] 12/29/2019, 5:59 PM  Clinical Narrative:                  Patient presents to [facility] due to SOB and weakness.  CSW explained CSW role and process and time line for SNF placement and home health explained three night Medicare requirement.  Both patient and son, Kymberli Wiegand verbalized understanding. Patient stated she does not want SNF placement regardless of recommendation and would like to return home with home health.  Her preferred home health agency is [place].  Patient will stay overnight in the Ed for observation.  CSW stated I would contact [place] tomorrow 8/17 and speak to them about setting up home health.  Patient and so verbalized understanding. Expected Discharge Plan: Skilled Nursing Facility Barriers to Discharge: Other (comment), Continued Medical Work up [employer] three night stay)   Patient Goals and CMS Choice Patient states their goals for this hospitalization and ongoing recovery are:: Patient states she does not want SNF placment will only consider home health. CMS Medicare.gov Compare Post Acute Care list provided to:: Patient Represenative (must comment) (Viens,James (Son) [phone]) Choice offered to / list presented to : Adult Children Blakely, Maranan (Son) [phone])  Expected Discharge Plan and Services Expected Discharge Plan: [place] In-house Referral: Clinical Social Work   Post Acute Care Choice: [place] arrangements for the past 2 months: [place]: [facility])        Prior Living Arrangements/Services Living arrangements for the past 2 months: [place] with:: Self Patient language and need for  interpreter reviewed:: Yes Do you feel safe going back to the place where you live?: Yes      Need for Family Participation in Patient Care: Yes (Comment) Care giver support system in place?: Yes (comment)   Criminal Activity/Legal Involvement Pertinent to Current Situation/Hospitalization: No - Comment as needed  Activities of Daily Living      Permission Sought/Granted Permission sought to share information with : Facility Sport and exercise psychologist, Family Supports    Share Information with NAME: Lenia, Housley (Son) [phone]  Permission granted to share info w AGENCY: [facility]        Emotional Assessment Appearance:: Appears stated age Attitude/Demeanor/Rapport: Crying, Complaining Affect (typically observed): Sad, Anxious Orientation: : Oriented to Self, Oriented to Place, Oriented to  Time, Oriented to Situation Alcohol / Substance Use: Not Applicable Psych Involvement: No (comment)  Admission diagnosis:  Hyponatremia [E87.1] Patient Active Problem List   Diagnosis Date Noted  . Hyponatremia 12/26/2019  . Shortness of breath 12/26/2019  . HTN (hypertension) 12/26/2019  . Hypothyroidism 12/26/2019  . Syncope 12/28/2016   PCP:  Derinda Late, MD Pharmacy:   [facility], [place] [address] Phone: [phone] Fax: [phone]     Social Determinants of Health (SDOH) Interventions    Readmission Risk Interventions No flowsheet data found.

## 2019-12-29 NOTE — ED Notes (Signed)
Pt was upset she couldn't find her cell phone. Helped pt look for it and we found it in the bed. Pt requested supplies to brush teeth in bed. I gave her the supplies she needed.

## 2019-12-29 NOTE — ED Notes (Signed)
Pt crying, states she needs medication to help her sleep. Pt states she wants the nurse to stay in her room and hold her hand all night. This RN informed pt that she is sorry but at this time due to the illnesses of other pt's we are not able to stay in room and hold her hand. Pt offered repositioning, po fluids, toileting and emotional reassurance.

## 2019-12-29 NOTE — ED Notes (Signed)
Called dining services and asked if they could bring patient some Ensure

## 2019-12-29 NOTE — Evaluation (Signed)
Physical Therapy Evaluation Patient Details Name: Cynthia Mahoney MRN: [id number] DOB: February 10, 1935 Today's Date: 12/29/2019   History of Present Illness  Pt is a 85 y.o. female w/ PMH of: HTN, glaucoma, anxiety, arthritis, asthma,GERD, hypothyroidism, bilateral TKR, R rotator cuff repair, CKD IIIa, and lip and arm lacerations s/p 8/4 fall. Per MD impression, pt currently presents with: symptomatic hyponatremia, SOB, acute asthma exacerbation, acute hypoxic respiratory failure, and leukocytosis possibly reactive to oral steroids.  Clinical Impression  Pt was pleasant and motivated to participate during the session. Pt remained very anxious and somewhat teared up throughout session and appeared to be somewhat limited by fatigue and anxiety. Pt able to perform supine and seated exercises w/ no physical assist and min instructional cueing. Pt demonstrated mod-I w/ HOB elevated for supine-sit. Pt required mod-A to initiate movement for sit-to-stand as well as min cueing for hand positioning. Once in standing, pt was able to take a few very small, shuffled, effortful steps at the EOB, totaling about ~59ft before requesting to sit down 2/2 fatigue. Pt generally appeared rigid in standing and required mod BUE support on RW. Pt required min-A for BLE management w/ sit-supine as well as mod cueing for repositioning. Per history, pt was grossly independent w/o AD prior to admission; as of today's evaluation, pt does not appear to be at baseline level of function or demonstrate enough functional mobility for a safe return home. Pt will benefit from PT services in a SNF setting upon discharge to safely address deficits listed in patient problem list for decreased caregiver assistance and eventual return to PLOF.     Follow Up Recommendations SNF    Equipment Recommendations  None recommended by PT    Recommendations for Other Services       Precautions / Restrictions Precautions Precautions: Fall;Other  (comment) Precaution Comments: seizure precautions Restrictions Weight Bearing Restrictions: No      Mobility  Bed Mobility Overal bed mobility: Needs Assistance Bed Mobility: Supine to Sit;Sit to Supine     Supine to sit: HOB elevated;Modified independent (Device/Increase time) Sit to supine: Min assist   General bed mobility comments: min A for BLE management sit-supine; required mod cueing for repositioning  Transfers Overall transfer level: Needs assistance Equipment used: Rolling walker (2 wheeled) Transfers: Sit to/from Stand Sit to Stand: Mod assist         General transfer comment: required mod-A to initiate movement; min cueing for hand positioning  Ambulation/Gait Ambulation/Gait assistance: Min guard Gait Distance (Feet): 3 Feet Assistive device: Rolling walker (2 wheeled) Gait Pattern/deviations: Step-through pattern;Decreased step length - right;Decreased step length - left;Shuffle Gait velocity: decreased   General Gait Details: Pt able to take a few very small, shuffled, effortful steps at EOB before requesting to sit down 2/2 fatigue  Stairs            Wheelchair Mobility    Modified Rankin (Stroke Patients Only)       Balance Overall balance assessment: Needs assistance Sitting-balance support: Feet supported;Feet unsupported Sitting balance-Leahy Scale: Good       Standing balance-Leahy Scale: Fair Standing balance comment: Pt very rigid in standing; mod BUE support on RW                             Pertinent Vitals/Pain Pain Assessment: No/denies pain    Home Living Family/patient expects to be discharged to:: Private residence Living Arrangements: Alone Available Help at Discharge: Family;Available PRN/intermittently;Other (  Comment) Advertising account executive, son) Type of Home: House Home Access: Stairs to enter Entrance Stairs-Rails: Right;Left;Can reach both Entrance Stairs-Number of Steps: 2 Home Layout: One level Home  Equipment: Walker - 2 wheels;Walker - 4 wheels;Cane - single point;Bedside commode;Shower seat Additional Comments: Pt states has tub shower but does not use as she sponge bathes. Pt has family support in the area but denies 24/7 availability. Pt gives the impression of being vastly independent & not wanting family members living with her to assist if needed.    Prior Function Level of Independence: Independent         Comments: Pt states able to walk community distances prior to admission w/o AD; sometimes SOB with longer walks (i.e. [employer] parking lot). Pt fell 2wks ago leading to a hospital admission, but denies other falls. Son states that pt has fallen multiple times this year and was unable to get up. Pt states lives alone and independent w/ ADLs     Hand Dominance        Extremity/Trunk Assessment   Upper Extremity Assessment Upper Extremity Assessment: Generalized weakness    Lower Extremity Assessment Lower Extremity Assessment: Generalized weakness       Communication   Communication: No difficulties  Cognition Arousal/Alertness: Awake/alert Behavior During Therapy: Anxious Overall Cognitive Status: Within Functional Limits for tasks assessed                                 General Comments: A&Ox4; pt very anxious and crying throughout entire session      General Comments      Exercises Total Joint Exercises Ankle Circles/Pumps: AROM;Strengthening;Both;10 reps Hip ABduction/ADduction: AROM;Strengthening;Both;10 reps Straight Leg Raises: AROM;Strengthening;Both;10 reps Long Arc Quad: AROM;Strengthening;Both;10 reps Knee Flexion: AROM;Strengthening;Both;10 reps   Assessment/Plan    PT Assessment Patient needs continued PT services  PT Problem List Decreased strength;Decreased activity tolerance;Decreased balance;Decreased knowledge of use of DME;Decreased mobility       PT Treatment Interventions DME instruction;Gait training;Stair  training;Functional mobility training;Therapeutic activities;Patient/family education;Therapeutic exercise;Balance training    PT Goals (Current goals can be found in the Care Plan section)  Acute Rehab PT Goals Patient Stated Goal: to get stronger and walk by herself PT Goal Formulation: With patient Time For Goal Achievement: 01/11/20 Potential to Achieve Goals: Fair    Frequency Min 2X/week   Barriers to discharge        Co-evaluation               AM-PAC PT "6 Clicks" Mobility  Outcome Measure Help needed turning from your back to your side while in a flat bed without using bedrails?: A Little Help needed moving from lying on your back to sitting on the side of a flat bed without using bedrails?: A Little Help needed moving to and from a bed to a chair (including a wheelchair)?: A Little Help needed standing up from a chair using your arms (e.g., wheelchair or bedside chair)?: A Little Help needed to walk in hospital room?: A Lot Help needed climbing 3-5 steps with a railing? : A Lot 6 Click Score: 16    End of Session Equipment Utilized During Treatment: Gait belt Activity Tolerance: Patient limited by fatigue;Other (comment) (anxiety) Patient left: in bed;with call bell/phone within reach;with bed alarm set Nurse Communication: Mobility status PT Visit Diagnosis: Unsteadiness on feet (R26.81);Repeated falls (R29.6);Muscle weakness (generalized) (M62.81);Difficulty in walking, not elsewhere classified (R26.2)    Time: 0737-1062 PT  Time Calculation (min) (ACUTE ONLY): 36 min   Charges:              Dayvon Dax SPT 12/29/19, 1:08 PM

## 2019-12-29 NOTE — ED Notes (Signed)
Pt turned to left side per request and repositioned in bed. Pt continues to verbalize displeasure at admission status.

## 2019-12-29 NOTE — ED Notes (Signed)
Pt sleeping. 

## 2019-12-29 NOTE — ED Notes (Signed)
Patient up and ambulating to room commode with one person assist and her cane. Patient gait was steady, she made herself nervous, worried she would fall, but she did very well, she and Probation officer also walked around room

## 2019-12-30 ENCOUNTER — Encounter: Payer: Self-pay | Admitting: Internal Medicine

## 2019-12-30 LAB — CBC WITH DIFFERENTIAL/PLATELET
Abs Immature Granulocytes: 0.33 10*3/uL — ABNORMAL HIGH (ref 0.00–0.07)
Basophils Absolute: 0.1 10*3/uL (ref 0.0–0.1)
Basophils Relative: 1 %
Eosinophils Absolute: 0.2 10*3/uL (ref 0.0–0.5)
Eosinophils Relative: 2 %
HCT: 33.3 % — ABNORMAL LOW (ref 36.0–46.0)
Hemoglobin: 11.9 g/dL — ABNORMAL LOW (ref 12.0–15.0)
Immature Granulocytes: 2 %
Lymphocytes Relative: 22 %
Lymphs Abs: 3.3 10*3/uL (ref 0.7–4.0)
MCH: 32.2 pg (ref 26.0–34.0)
MCHC: 35.7 g/dL (ref 30.0–36.0)
MCV: 90.2 fL (ref 80.0–100.0)
Monocytes Absolute: 1.3 10*3/uL — ABNORMAL HIGH (ref 0.1–1.0)
Monocytes Relative: 8 %
Neutro Abs: 9.6 10*3/uL — ABNORMAL HIGH (ref 1.7–7.7)
Neutrophils Relative %: 65 %
Platelets: 535 10*3/uL — ABNORMAL HIGH (ref 150–400)
RBC: 3.69 MIL/uL — ABNORMAL LOW (ref 3.87–5.11)
RDW: 12.4 % (ref 11.5–15.5)
WBC: 14.9 10*3/uL — ABNORMAL HIGH (ref 4.0–10.5)
nRBC: 0 % (ref 0.0–0.2)

## 2019-12-30 LAB — BASIC METABOLIC PANEL
Anion gap: 11 (ref 5–15)
BUN: 7 mg/dL — ABNORMAL LOW (ref 8–23)
CO2: 20 mmol/L — ABNORMAL LOW (ref 22–32)
Calcium: 8.9 mg/dL (ref 8.9–10.3)
Chloride: 92 mmol/L — ABNORMAL LOW (ref 98–111)
Creatinine, Ser: 0.68 mg/dL (ref 0.44–1.00)
GFR calc Af Amer: >60 mL/min (ref >60)
GFR calc non Af Amer: >60 mL/min (ref >60)
Glucose, Bld: 119 mg/dL — ABNORMAL HIGH (ref 70–99)
Potassium: 4.5 mmol/L (ref 3.5–5.1)
Sodium: 123 mmol/L — ABNORMAL LOW (ref 135–145)

## 2019-12-30 LAB — SODIUM
Sodium: 122 mmol/L — ABNORMAL LOW (ref 135–145)
Sodium: 120 mmol/L — ABNORMAL LOW (ref 135–145)
Sodium: 122 mmol/L — ABNORMAL LOW (ref 135–145)
Sodium: 126 mmol/L — ABNORMAL LOW (ref 135–145)
Sodium: 125 mmol/L — ABNORMAL LOW (ref 135–145)

## 2019-12-30 MED ORDER — METOPROLOL TARTRATE 50 MG PO TABS
50.0000 mg | ORAL_TABLET | Freq: Two times a day (BID) | ORAL | Status: DC
  Administered 2019-12-30 – 2020-01-01 (×4): 50 mg via ORAL
  Filled 2019-12-30 (×4): qty 1

## 2019-12-30 MED ORDER — SODIUM CHLORIDE 1 G PO TABS
1.0000 g | ORAL_TABLET | Freq: Three times a day (TID) | ORAL | Status: DC
  Administered 2019-12-30: 1 g via ORAL
  Filled 2019-12-30 (×2): qty 1

## 2019-12-30 MED ORDER — METOPROLOL TARTRATE 25 MG PO TABS
25.0000 mg | ORAL_TABLET | Freq: Once | ORAL | Status: AC
  Administered 2019-12-30: 25 mg via ORAL
  Filled 2019-12-30: qty 1

## 2019-12-30 MED ORDER — SODIUM CHLORIDE 1 G PO TABS
2.0000 g | ORAL_TABLET | Freq: Three times a day (TID) | ORAL | Status: DC
  Administered 2019-12-30 – 2019-12-31 (×2): 2 g via ORAL
  Filled 2019-12-30 (×4): qty 2

## 2019-12-30 MED ORDER — ALUM & MAG HYDROXIDE-SIMETH 200-200-20 MG/5ML PO SUSP
30.0000 mL | Freq: Four times a day (QID) | ORAL | Status: DC | PRN
  Administered 2019-12-30 – 2019-12-31 (×2): 30 mL via ORAL
  Filled 2019-12-30 (×2): qty 30

## 2019-12-30 MED ORDER — SODIUM CHLORIDE 1 G PO TABS
1.0000 g | ORAL_TABLET | Freq: Two times a day (BID) | ORAL | Status: DC
  Administered 2019-12-30: 1 g via ORAL
  Filled 2019-12-30 (×2): qty 1

## 2019-12-30 MED ORDER — TOLVAPTAN 15 MG PO TABS
15.0000 mg | ORAL_TABLET | ORAL | Status: DC
  Administered 2019-12-30 – 2019-12-31 (×2): 15 mg via ORAL
  Filled 2019-12-30 (×3): qty 1

## 2019-12-30 MED ORDER — TRAZODONE HCL 50 MG PO TABS
50.0000 mg | ORAL_TABLET | Freq: Every evening | ORAL | Status: DC | PRN
  Administered 2019-12-31: 50 mg via ORAL
  Filled 2019-12-30: qty 1

## 2019-12-30 NOTE — Progress Notes (Signed)
[place], [place] 12/30/19  Subjective:   Hospital day # 4 Denies any acute complaints.  Able to eat without nausea or vomiting Sodium level has decreased today Normal saline has been stopped and patient started on sodium chloride tablets    08/16 0701 - 08/17 0700 In: 630 [P.O.:180; I.V.:450] Out: 300 [Urine:300] Lab Results  Component Value Date   CREATININE 0.68 12/30/2019   CREATININE 0.74 12/29/2019   CREATININE 0.69 12/29/2019     Objective:  Vital signs in last 24 hours:  Temp:  [97.7 F (36.5 C)-98.5 F (36.9 C)] 98 F (36.7 C) (08/17 1149) Pulse Rate:  [72-94] 86 (08/17 1149) Resp:  [16-20] 18 (08/17 1149) BP: (128-182)/(61-87) 158/64 (08/17 1149) SpO2:  [96 %-99 %] 98 % (08/17 1149) Weight:  [88.1 kg-88.9 kg] 88.9 kg (08/17 0248)  Weight change:  Filed Weights   12/26/19 2134 12/29/19 2310 12/30/19 0248  Weight: 87.1 kg 88.1 kg 88.9 kg    Intake/Output:    Intake/Output Summary (Last 24 hours) at 12/30/2019 1318 Last data filed at 12/30/2019 1152 Gross per 24 hour  Intake 750 ml  Output 1025 ml  Net -275 ml   Physical Exam: General:  No acute distress, laying in the bed  HEENT  anicteric, moist oral mucous membrane  Pulm/lungs  normal breathing effort, lungs are clear to auscultation  CVS/Heart  regular rhythm, no rub or gallop  Abdomen:   Soft, nontender  Extremities:  No peripheral edema  Neurologic:  Alert, oriented, able to follow commands  Skin:  No acute rashes     Basic Metabolic Panel:  Recent Labs  Lab 12/26/19 2226 12/27/19 0424 12/28/19 0455 12/28/19 0455 12/28/19 1613 12/29/19 0404 12/29/19 2019 12/30/19 0604 12/30/19 1052  NA 115*   < > 124*   < > 124* 125* 122* 122*  123* 120*  K 3.3*  --  3.0*  --   --  4.1 4.2 4.5  --   CL 79*  --  91*  --   --  94* 91* 92*  --   CO2 24  --  25  --   --  23 21* 20*  --   GLUCOSE 133*  --  108*  --   --  96 117* 119*  --   BUN 14  --  8  --   --  8 7*  7*  --   CREATININE 1.05*  --  0.75  --   --  0.69 0.74 0.68  --   CALCIUM 8.6*  --  8.5*   < >  --  8.1* 8.7* 8.9  --   MG  --   --  1.2*  --   --   --   --   --   --   PHOS  --   --  2.4*  --   --   --   --   --   --    < > = values in this interval not displayed.     CBC: Recent Labs  Lab 12/26/19 2226 12/28/19 0455 12/29/19 0404 12/30/19 0604  WBC 13.1* 18.0* 13.4* 14.9*  NEUTROABS 9.9* 12.9* 8.4* 9.6*  HGB 10.8* 10.2* 10.0* 11.9*  HCT 29.5* 27.3* 28.4* 33.3*  MCV 89.1 86.7 91.6 90.2  PLT 465* 492* 472* 535*     No results found for: HEPBSAG, HEPBSAB, HEPBIGM    Microbiology:  Recent Results (from the past 240 hour(s))  SARS Coronavirus 2  by RT PCR (hospital order, performed in [facility] hospital lab) Nasopharyngeal Nasopharyngeal Swab     Status: None   Collection Time: 12/26/19 10:26 PM   Specimen: Nasopharyngeal Swab  Result Value Ref Range Status   SARS Coronavirus 2 NEGATIVE NEGATIVE Final    Comment: (NOTE) SARS-CoV-2 target nucleic acids are NOT DETECTED.  The SARS-CoV-2 RNA is generally detectable in upper and lower respiratory specimens during the acute phase of infection. The lowest concentration of SARS-CoV-2 viral copies this assay can detect is 250 copies / mL. A negative result does not preclude SARS-CoV-2 infection and should not be used as the sole basis for treatment or other patient management decisions.  A negative result may occur with improper specimen collection / handling, submission of specimen other than nasopharyngeal swab, presence of viral mutation(s) within the areas targeted by this assay, and inadequate number of viral copies (<250 copies / mL). A negative result must be combined with clinical observations, patient history, and epidemiological information.  Fact Sheet for Patients:   StrictlyIdeas.no  Fact Sheet for Healthcare Providers: BankingDealers.co.za  This test is not  yet approved or  cleared by the [place] FDA and has been authorized for detection and/or diagnosis of SARS-CoV-2 by FDA under an Emergency Use Authorization (EUA).  This EUA will remain in effect (meaning this test can be used) for the duration of the COVID-19 declaration under Section 564(b)(1) of the Act, 21 U.S.C. section 360bbb-3(b)(1), unless the authorization is terminated or revoked sooner.  Performed at [facility], [address] [place],  [address]     Coagulation Studies: No results for input(s): LABPROT, INR in the last 72 hours.  Urinalysis: No results for input(s): COLORURINE, LABSPEC, PHURINE, GLUCOSEU, HGBUR, BILIRUBINUR, KETONESUR, PROTEINUR, UROBILINOGEN, NITRITE, LEUKOCYTESUR in the last 72 hours.  Invalid input(s): APPERANCEUR    Imaging: No results found.   Medications:    . amLODipine  10 mg Oral Daily  . aspirin EC  81 mg Oral Daily  . brimonidine  1 drop Both Eyes BID  . budesonide  0.5 mg Nebulization Daily  . busPIRone  10 mg Oral BID  . calcium carbonate  750 mg Oral Daily  . doxycycline  100 mg Oral Q12H  . enoxaparin (LOVENOX) injection  40 mg Subcutaneous Q24H  . feeding supplement (ENSURE ENLIVE)  237 mL Oral BID BM  . fluticasone  2 spray Each Nare Daily  . levothyroxine  88 mcg Oral QAC breakfast  . magnesium oxide  400 mg Oral Daily  . metoprolol tartrate  25 mg Oral Once  . metoprolol tartrate  50 mg Oral BID  . montelukast  10 mg Oral Daily  . multivitamin with minerals  1 tablet Oral Daily  . pantoprazole  40 mg Oral Daily  . polyethylene glycol  17 g Oral Daily  . pravastatin  40 mg Oral QHS  . senna-docusate  2 tablet Oral BID  . sodium chloride  2 g Oral TID WC   acetaminophen **OR** acetaminophen, albuterol, alum & mag hydroxide-simeth, bisacodyl, diphenhydrAMINE, guaiFENesin, hydrOXYzine, ondansetron **OR** ondansetron (ZOFRAN) IV, traZODone  Assessment/ Plan:  85 y.o. female with hypertension,  glaucoma   admitted on 12/26/2019 for Hyponatremia [E87.1] SOB (shortness of breath) [R06.02] Exacerbation of asthma, unspecified asthma severity, unspecified whether persistent [J45.901]   #Hyponatremia Likely SIADH Serum sodium has worsened with normal saline administration which has been discontinued now. Chest x-ray negative for active cardiopulmonary disease.  No obvious mass. Discussed fluid restriction with  patient to limit to 1 to 1.2 L/day Add tolvaptan Continue sodium chloride tablets We will follow closely    LOS: 4 Jaleen Grupp 8/17/20211:18 PM  [place], [place]  Note: This note was prepared with Dragon dictation. Any transcription errors are unintentional

## 2019-12-30 NOTE — TOC Progression Note (Signed)
Transition of Care [facility]) - Progression Note    Patient Details  Name: Cynthia Mahoney MRN: [id number] Date of Birth: 1935/03/07  Transition of Care [facility]) CM/SW Roy, RN Phone Number: 12/30/2019, 12:41 PM  Clinical Narrative:     Spoke with son and patient.  Patient is declining SNF at this time.  [facility]ed [facility] PT, RN, OT, and aide.   Patient lives at home and her family visits often to help.   Expected Discharge Plan: [place] Barriers to Discharge: Continued Medical Work up  Expected Discharge Plan and Services Expected Discharge Plan: [place] In-house Referral: Clinical Social Work   Post Acute Care Choice: [place] arrangements for the past 2 months: [place]: PT, OT, RN, Nurse's Aide [place] Agency: [facility] [facility] [employer] Date [place]: 12/30/19 Time [place]: 1236 Representative spoke with at [place]: [place] ([place]) Interventions    Readmission Risk Interventions No flowsheet data found.

## 2019-12-30 NOTE — Progress Notes (Signed)
PROGRESS NOTE  Cynthia Mahoney OFH:[id number] DOB: November 11, 1934 DOA: 12/26/2019 PCP: Cynthia Late, MD  HPI/Recap of past 24 hours: Cynthia [name] medical history significant forhypertension, glaucoma, with complaints of shortness of breath for the past several weeks, failing outpatient Bactrim treatment by PCP and referred to pulmonology who she saw on 12/24/2019 and prescribedLevaquinnow presents to the emergency room with protracted weakness and continued shortness of breath.  ED Course:On arrival, she is mildly tachypneic but with otherwise normal vitals. WBC elevated at 13,000, hemoglobin 10.8. Venous blood gas showed normal pH of 7.43 with decreased PCO2 of 39. Blood work most notable for sodium of 115, down from 125 at her doctor's office on 12/19/2019. Creatinine was 1.05 which is about her baseline. Chest x-ray showed no acute process. Patient given a normal saline bolus. Hospitalist consulted for admission.  12/30/19: Seen and examined with her son at bedside.  Some anxiety noted.  States she can't sleep at night.  Added trazodone nightly as needed for sleep.  Faint rales noted at bases on auscultation.  Will DC IV fluids and start salt tablets.  Last serum sodium 120.  Nephrology assisting.  Continue to closely monitor serum sodium, no more than 8 mEq increase in the next 24H.   Assessment/Plan: Active Problems:   Hyponatremia   Shortness of breath   HTN (hypertension)   Hypothyroidism   Hypovolemic hyponatremia, symptomatic -Presented with sodium 115, symptomatic for shortness of breath and weakness TSH normal -Possibly secondary to HCTZ, continue to hold -Responded well to normal saline -Serum sodium 125, then down trended to 120 Now off IV fluid normal saline Started sodium tablet 2 g 3 times daily Start fluid restriction less than 1.5 L/24 hour Continue to monitor serum sodium, every 4 hours. -Appreciate nephrology's  assistance.  Acute asthma exacerbation -Presented with dyspnea with minimal exertion -Started on oral steroids and Levaquin outpatient by her pulmonologist -Continue oral steroids, switched to doxycycline Continue bronchodilators Maintain O2 saturation greater than 92% Obtain home O2 evaluation for DC planning Will need to follow-up with her pulmonologist outpatient.  Acute hypoxic respiratory failure likely secondary to acute asthma exacerbation Not on oxygen supplementation at baseline Currently on RA with O2 saturation 98%. Home O2 eval for DC planning  Improving leukocytosis, possibly reactive in the setting of oral steroids WBC up trending No clear evidence of lobular pulmonary infiltrates on chest x-ray Continue doxycycline Procalcitonin less than 0.10  HTN (hypertension) BP is not at goal Increase metoprolol to 50 mg twice daily Continue to hold off HCTZ due to hyponatremia.  Hypothyroidism TSH 2.2 -Continue levothyroxine  Situational insomnia Start trazodone nightly as needed Ambien did not appear to help her.    DVT prophylaxis: Lovenox subcu daily Code Status: full code  Family Communication:   Updated her son at bedside.   Consults called: Nephrology   Status is: Inpatient    Dispo:  Patient From: Home  Planned Disposition: Home with Health Care Svc  Expected discharge date: 12/31/19  Medically stable for discharge: No, ongoing management of hypovolemic hyponatremia.         Objective: Vitals:   12/30/19 0526 12/30/19 0758 12/30/19 0804 12/30/19 1149  BP: (!) 156/87  (!) 155/77 (!) 158/64  Pulse: 94 84 88 86  Resp:  17 19 18   Temp: 98.5 F (36.9 C)  98.3 F (36.8 C) 98 F (36.7 C)  TempSrc: Oral  Oral Oral  SpO2: 98% 97% 98% 98%  Weight:  Height:        Intake/Output Summary (Last 24 hours) at 12/30/2019 1249 Last data filed at 12/30/2019 1152 Gross per 24 hour  Intake 750 ml  Output 1025 ml  Net -275 ml   Filed  Weights   12/26/19 2134 12/29/19 2310 12/30/19 0248  Weight: 87.1 kg 88.1 kg 88.9 kg    Exam:  . General: 85 y.o. year-old female pleasant in no acute distress.  Alert oriented x3.  .  Cardiovascular: Regular rate and rhythm no rubs or gallops.   Marland Kitchen Respiratory: Mild rales at bases no wheezing noted. . Abdomen: Soft nontender normal bowel sounds present.   . Musculoskeletal: Trace lower extremity edema bilaterally. Marland Kitchen Psychiatry: Mood is appropriate for condition and setting.   Data Reviewed: CBC: Recent Labs  Lab 12/26/19 2226 12/28/19 0455 12/29/19 0404 12/30/19 0604  WBC 13.1* 18.0* 13.4* 14.9*  NEUTROABS 9.9* 12.9* 8.4* 9.6*  HGB 10.8* 10.2* 10.0* 11.9*  HCT 29.5* 27.3* 28.4* 33.3*  MCV 89.1 86.7 91.6 90.2  PLT 465* 492* 472* 409*   Basic Metabolic Panel: Recent Labs  Lab 12/26/19 2226 12/27/19 0424 12/28/19 0455 12/28/19 0455 12/28/19 1613 12/29/19 0404 12/29/19 2019 12/30/19 0604 12/30/19 1052  NA 115*   < > 124*   < > 124* 125* 122* 122*  123* 120*  K 3.3*  --  3.0*  --   --  4.1 4.2 4.5  --   CL 79*  --  91*  --   --  94* 91* 92*  --   CO2 24  --  25  --   --  23 21* 20*  --   GLUCOSE 133*  --  108*  --   --  96 117* 119*  --   BUN 14  --  8  --   --  8 7* 7*  --   CREATININE 1.05*  --  0.75  --   --  0.69 0.74 0.68  --   CALCIUM 8.6*  --  8.5*  --   --  8.1* 8.7* 8.9  --   MG  --   --  1.2*  --   --   --   --   --   --   PHOS  --   --  2.4*  --   --   --   --   --   --    < > = values in this interval not displayed.   GFR: Estimated Creatinine Clearance: 54.9 mL/min (by C-G formula based on SCr of 0.68 mg/dL). Liver Function Tests: Recent Labs  Lab 12/26/19 2226  AST 22  ALT 14  ALKPHOS 100  BILITOT 0.7  PROT 6.9  ALBUMIN 3.3*   No results for input(s): LIPASE, AMYLASE in the last 168 hours. No results for input(s): AMMONIA in the last 168 hours. Coagulation Profile: No results for input(s): INR, PROTIME in the last 168 hours. Cardiac  Enzymes: No results for input(s): CKTOTAL, CKMB, CKMBINDEX, TROPONINI in the last 168 hours. BNP (last 3 results) No results for input(s): PROBNP in the last 8760 hours. HbA1C: No results for input(s): HGBA1C in the last 72 hours. CBG: No results for input(s): GLUCAP in the last 168 hours. Lipid Profile: No results for input(s): CHOL, HDL, LDLCALC, TRIG, CHOLHDL, LDLDIRECT in the last 72 hours. Thyroid Function Tests: No results for input(s): TSH, T4TOTAL, FREET4, T3FREE, THYROIDAB in the last 72 hours. Anemia Panel: Recent Labs    12/27/19 1955  VITAMINB12 314  FOLATE 16.0  FERRITIN 124  TIBC 318  IRON 57  RETICCTPCT 2.2   Urine analysis:    Component Value Date/Time   COLORURINE STRAW (A) 12/27/2019 0426   APPEARANCEUR CLEAR (A) 12/27/2019 0426   APPEARANCEUR Hazy 12/26/2011 1450   LABSPEC 1.004 (L) 12/27/2019 0426   LABSPEC 1.016 12/26/2011 1450   PHURINE 7.0 12/27/2019 0426   GLUCOSEU NEGATIVE 12/27/2019 0426   GLUCOSEU Negative 12/26/2011 1450   HGBUR NEGATIVE 12/27/2019 0426   BILIRUBINUR NEGATIVE 12/27/2019 0426   BILIRUBINUR Negative 12/26/2011 1450   KETONESUR 5 (A) 12/27/2019 0426   PROTEINUR NEGATIVE 12/27/2019 0426   NITRITE NEGATIVE 12/27/2019 0426   LEUKOCYTESUR NEGATIVE 12/27/2019 0426   LEUKOCYTESUR 3+ 12/26/2011 1450   Sepsis Labs: @LABRCNTIP (procalcitonin:4,lacticidven:4)  ) Recent Results (from the past 240 hour(s))  SARS Coronavirus 2 by RT PCR (hospital order, performed in [place] hospital lab) Nasopharyngeal Nasopharyngeal Swab     Status: None   Collection Time: 12/26/19 10:26 PM   Specimen: Nasopharyngeal Swab  Result Value Ref Range Status   SARS Coronavirus 2 NEGATIVE NEGATIVE Final    Comment: (NOTE) SARS-CoV-2 target nucleic acids are NOT DETECTED.  The SARS-CoV-2 RNA is generally detectable in upper and lower respiratory specimens during the acute phase of infection. The lowest concentration of SARS-CoV-2 viral copies this  assay can detect is 250 copies / mL. A negative result does not preclude SARS-CoV-2 infection and should not be used as the sole basis for treatment or other patient management decisions.  A negative result may occur with improper specimen collection / handling, submission of specimen other than nasopharyngeal swab, presence of viral mutation(s) within the areas targeted by this assay, and inadequate number of viral copies (<250 copies / mL). A negative result must be combined with clinical observations, patient history, and epidemiological information.  Fact Sheet for Patients:   StrictlyIdeas.no  Fact Sheet for Healthcare Providers: BankingDealers.co.za  This test is not yet approved or  cleared by the [place] FDA and has been authorized for detection and/or diagnosis of SARS-CoV-2 by FDA under an Emergency Use Authorization (EUA).  This EUA will remain in effect (meaning this test can be used) for the duration of the COVID-19 declaration under Section 564(b)(1) of the Act, 21 U.S.C. section 360bbb-3(b)(1), unless the authorization is terminated or revoked sooner.  Performed at [facility], [address], [place], [place] [address]       Studies: No results found.  Scheduled Meds: . amLODipine  10 mg Oral Daily  . aspirin EC  81 mg Oral Daily  . brimonidine  1 drop Both Eyes BID  . budesonide  0.5 mg Nebulization Daily  . busPIRone  10 mg Oral BID  . calcium carbonate  750 mg Oral Daily  . doxycycline  100 mg Oral Q12H  . enoxaparin (LOVENOX) injection  40 mg Subcutaneous Q24H  . feeding supplement (ENSURE ENLIVE)  237 mL Oral BID BM  . fluticasone  2 spray Each Nare Daily  . levothyroxine  88 mcg Oral QAC breakfast  . magnesium oxide  400 mg Oral Daily  . metoprolol tartrate  25 mg Oral BID  . montelukast  10 mg Oral Daily  . multivitamin with minerals  1 tablet Oral Daily  . pantoprazole  40 mg Oral  Daily  . polyethylene glycol  17 g Oral Daily  . pravastatin  40 mg Oral QHS  . senna-docusate  2 tablet Oral BID  . sodium  chloride  1 g Oral TID WC    Continuous Infusions:    LOS: 4 days     Kayleen Memos, MD [facility] Pager [phone]  If 7PM-7AM, please contact night-coverage [email or web address] Password [facility] 12/30/2019, 12:49 PM

## 2019-12-30 NOTE — Progress Notes (Signed)
Physical Therapy Treatment Patient Details Name: Cynthia Mahoney MRN: [id number] DOB: 1934/10/27 Today's Date: 12/30/2019    History of Present Illness Pt is a 85 y.o. female w/ PMH of: HTN, glaucoma, anxiety, arthritis, asthma,GERD, hypothyroidism, bilateral TKR, R rotator cuff repair, CKD IIIa, and lip and arm lacerations s/p 8/4 fall. Per MD impression, pt currently presents with: symptomatic hyponatremia, SOB, acute asthma exacerbation, acute hypoxic respiratory failure, and leukocytosis possibly reactive to oral steroids.    PT Comments    Pt was pleasant and motivated to participate during the session. Pt demonstrated mod-I with bed mobility and transfers this session; required slight inc time and effort. Pt was able to ambulate 53ft to bathroom and 79ft back to recliner w/ RW and CGA. Pt ambulated with a step-through pattern and small bilateral steps; appeared somewhat fatigued towards the end of both ambulation bouts but steady on feet throughout w/ no LOB. Pt demonstrated good dynamic balance and was able to pull down and pull up briefs w/o UE stabilization on RW. Overall, pt has made good progress w/ functional mobility since yesterday's session; d/c recommendations have been updated. Pt will benefit from HHPT services upon discharge to safely address deficits listed in patient problem list for decreased caregiver assistance and eventual return to PLOF.   Follow Up Recommendations  Home health PT;Supervision/Assistance - 24 hour     Equipment Recommendations  None recommended by PT    Recommendations for Other Services       Precautions / Restrictions Precautions Precautions: Fall;Other (comment) Precaution Comments: seizure precautions Restrictions Weight Bearing Restrictions: No    Mobility  Bed Mobility Overal bed mobility: Needs Assistance Bed Mobility: Supine to Sit     Supine to sit: HOB elevated;Modified independent (Device/Increase time)         Transfers Overall transfer level: Needs assistance Equipment used: Rolling walker (2 wheeled) Transfers: Sit to/from Stand Sit to Stand: Modified independent (Device/Increase time)         General transfer comment: inc time and effort  Ambulation/Gait Ambulation/Gait assistance: Min guard Gait Distance (Feet): 30 Feet; 20 Feet Assistive device: Rolling walker (2 wheeled) Gait Pattern/deviations: Step-through pattern;Decreased step length - right;Decreased step length - left Gait velocity: decreased   General Gait Details: Pt ambulated to and from bathroom; appeared somewhat fatigued   Stairs             Wheelchair Mobility    Modified Rankin (Stroke Patients Only)       Balance Overall balance assessment: Needs assistance Sitting-balance support: Feet supported;Feet unsupported Sitting balance-Leahy Scale: Good       Standing balance-Leahy Scale: Good Standing balance comment: mod BUE support on RW; able to shift weight through BLE                            Cognition Arousal/Alertness: Awake/alert Behavior During Therapy: WFL for tasks assessed/performed Overall Cognitive Status: Within Functional Limits for tasks assessed                                        Exercises Total Joint Exercises Long Arc Quad: AROM;Strengthening;Both;10 reps Knee Flexion: AROM;Strengthening;Both;10 reps    General Comments        Pertinent Vitals/Pain Pain Assessment: No/denies pain    Home Living  Prior Function            PT Goals (current goals can now be found in the care plan section) Progress towards PT goals: Progressing toward goals    Frequency    Min 2X/week      PT Plan Discharge plan needs to be updated    Co-evaluation              AM-PAC PT "6 Clicks" Mobility   Outcome Measure  Help needed turning from your back to your side while in a flat bed without using  bedrails?: A Little Help needed moving from lying on your back to sitting on the side of a flat bed without using bedrails?: A Little Help needed moving to and from a bed to a chair (including a wheelchair)?: A Little Help needed standing up from a chair using your arms (e.g., wheelchair or bedside chair)?: A Little Help needed to walk in hospital room?: A Little Help needed climbing 3-5 steps with a railing? : A Lot 6 Click Score: 17    End of Session Equipment Utilized During Treatment: Gait belt Activity Tolerance: Patient tolerated treatment well Patient left: in chair;with call bell/phone within reach;with chair alarm set Nurse Communication: Mobility status PT Visit Diagnosis: Unsteadiness on feet (R26.81);Repeated falls (R29.6);Muscle weakness (generalized) (M62.81);Difficulty in walking, not elsewhere classified (R26.2)     Time: 3154-0086 PT Time Calculation (min) (ACUTE ONLY): 29 min  Charges:                       Journi Moffa SPT 12/30/19, 5:25 PM

## 2019-12-30 NOTE — Progress Notes (Signed)
Called Lab to notify them about pt's q4hr Sodium not being obtained.

## 2019-12-30 NOTE — Care Management Important Message (Signed)
Important Message  Patient Details  Name: Cynthia Mahoney MRN: [id number] Date of Birth: 1934/08/03   Medicare Important Message Given:  Yes  Initial Medicare IM given by Patient Access Associate on 12/30/2019 at 8:59am.     Dannette Barbara 12/30/2019, 11:14 AM

## 2019-12-31 DIAGNOSIS — R0602 Shortness of breath: Secondary | ICD-10-CM

## 2019-12-31 DIAGNOSIS — E039 Hypothyroidism, unspecified: Secondary | ICD-10-CM

## 2019-12-31 LAB — CBC WITH DIFFERENTIAL/PLATELET
Abs Immature Granulocytes: 0.4 10*3/uL — ABNORMAL HIGH (ref 0.00–0.07)
Basophils Absolute: 0.2 10*3/uL — ABNORMAL HIGH (ref 0.0–0.1)
Basophils Relative: 1 %
Eosinophils Absolute: 0.2 10*3/uL (ref 0.0–0.5)
Eosinophils Relative: 2 %
HCT: 35.3 % — ABNORMAL LOW (ref 36.0–46.0)
Hemoglobin: 12.3 g/dL (ref 12.0–15.0)
Immature Granulocytes: 3 %
Lymphocytes Relative: 27 %
Lymphs Abs: 3.5 10*3/uL (ref 0.7–4.0)
MCH: 31.9 pg (ref 26.0–34.0)
MCHC: 34.8 g/dL (ref 30.0–36.0)
MCV: 91.5 fL (ref 80.0–100.0)
Monocytes Absolute: 1.1 10*3/uL — ABNORMAL HIGH (ref 0.1–1.0)
Monocytes Relative: 8 %
Neutro Abs: 8 10*3/uL — ABNORMAL HIGH (ref 1.7–7.7)
Neutrophils Relative %: 59 %
Platelets: 513 10*3/uL — ABNORMAL HIGH (ref 150–400)
RBC: 3.86 MIL/uL — ABNORMAL LOW (ref 3.87–5.11)
RDW: 12.6 % (ref 11.5–15.5)
WBC: 13.4 10*3/uL — ABNORMAL HIGH (ref 4.0–10.5)
nRBC: 0 % (ref 0.0–0.2)

## 2019-12-31 LAB — SODIUM
Sodium: 124 mmol/L — ABNORMAL LOW (ref 135–145)
Sodium: 125 mmol/L — ABNORMAL LOW (ref 135–145)
Sodium: 126 mmol/L — ABNORMAL LOW (ref 135–145)
Sodium: 126 mmol/L — ABNORMAL LOW (ref 135–145)

## 2019-12-31 LAB — BASIC METABOLIC PANEL
Anion gap: 9 (ref 5–15)
BUN: 10 mg/dL (ref 8–23)
CO2: 24 mmol/L (ref 22–32)
Calcium: 9.5 mg/dL (ref 8.9–10.3)
Chloride: 92 mmol/L — ABNORMAL LOW (ref 98–111)
Creatinine, Ser: 0.9 mg/dL (ref 0.44–1.00)
GFR calc Af Amer: >60 mL/min (ref >60)
GFR calc non Af Amer: 60 mL/min — ABNORMAL LOW (ref >60)
Glucose, Bld: 122 mg/dL — ABNORMAL HIGH (ref 70–99)
Potassium: 4.3 mmol/L (ref 3.5–5.1)
Sodium: 125 mmol/L — ABNORMAL LOW (ref 135–145)

## 2019-12-31 NOTE — Progress Notes (Signed)
[place], [place] 12/31/19  Subjective:   Hospital day # 5 Patient's son is in the room with her.  They report that she has been nauseous last night as well as this morning.  Oral intake has decreased.  Serum sodium improved but fluctuating between 1 24-1 26    08/17 0701 - 08/18 0700 In: 120 [P.O.:120] Out: 1775 [Urine:1775] Lab Results  Component Value Date   CREATININE 0.90 12/31/2019   CREATININE 0.68 12/30/2019   CREATININE 0.74 12/29/2019     Objective:  Vital signs in last 24 hours:  Temp:  [97.4 F (36.3 C)-98.5 F (36.9 C)] 97.4 F (36.3 C) (08/18 0748) Pulse Rate:  [75-86] 83 (08/18 0748) Resp:  [17-18] 18 (08/18 0748) BP: (156-170)/(64-94) 170/94 (08/18 0748) SpO2:  [97 %-100 %] 98 % (08/18 0748) Weight:  [89 kg] 89 kg (08/18 0500)  Weight change: 0.862 kg Filed Weights   12/29/19 2310 12/30/19 0248 12/31/19 0500  Weight: 88.1 kg 88.9 kg 89 kg    Intake/Output:    Intake/Output Summary (Last 24 hours) at 12/31/2019 1102 Last data filed at 12/31/2019 1026 Gross per 24 hour  Intake 0 ml  Output 1775 ml  Net -1775 ml   Physical Exam: General:  No acute distress, sitting up in recliner chair  HEENT  anicteric, moist oral mucous membrane  Pulm/lungs  normal breathing effort, lungs are clear to auscultation  CVS/Heart  regular rhythm, no rub or gallop  Abdomen:   Soft, nontender  Extremities:  No peripheral edema  Neurologic:  Sleepy today  Skin:  No acute rashes     Basic Metabolic Panel:  Recent Labs  Lab 12/28/19 0455 12/28/19 1613 12/29/19 0404 12/29/19 0404 12/29/19 2019 12/29/19 2019 12/30/19 0604 12/30/19 1052 12/30/19 1911 12/30/19 2107 12/31/19 0013 12/31/19 0458 12/31/19 0804  NA 124*   < > 125*   < > 122*   < > 122*  123*   < > 126* 125* 125* 125* 124*  K 3.0*  --  4.1  --  4.2  --  4.5  --   --   --   --  4.3  --   CL 91*  --  94*  --  91*  --  92*  --   --   --   --  92*  --   CO2 25  --  23   --  21*  --  20*  --   --   --   --  24  --   GLUCOSE 108*  --  96  --  117*  --  119*  --   --   --   --  122*  --   BUN 8  --  8  --  7*  --  7*  --   --   --   --  10  --   CREATININE 0.75  --  0.69  --  0.74  --  0.68  --   --   --   --  0.90  --   CALCIUM 8.5*  --  8.1*   < > 8.7*  --  8.9  --   --   --   --  9.5  --   MG 1.2*  --   --   --   --   --   --   --   --   --   --   --   --  PHOS 2.4*  --   --   --   --   --   --   --   --   --   --   --   --    < > = values in this interval not displayed.     CBC: Recent Labs  Lab 12/26/19 2226 12/28/19 0455 12/29/19 0404 12/30/19 0604 12/31/19 0458  WBC 13.1* 18.0* 13.4* 14.9* 13.4*  NEUTROABS 9.9* 12.9* 8.4* 9.6* 8.0*  HGB 10.8* 10.2* 10.0* 11.9* 12.3  HCT 29.5* 27.3* 28.4* 33.3* 35.3*  MCV 89.1 86.7 91.6 90.2 91.5  PLT 465* 492* 472* 535* 513*     No results found for: HEPBSAG, HEPBSAB, HEPBIGM    Microbiology:  Recent Results (from the past 240 hour(s))  SARS Coronavirus 2 by RT PCR (hospital order, performed in [facility] hospital lab) Nasopharyngeal Nasopharyngeal Swab     Status: None   Collection Time: 12/26/19 10:26 PM   Specimen: Nasopharyngeal Swab  Result Value Ref Range Status   SARS Coronavirus 2 NEGATIVE NEGATIVE Final    Comment: (NOTE) SARS-CoV-2 target nucleic acids are NOT DETECTED.  The SARS-CoV-2 RNA is generally detectable in upper and lower respiratory specimens during the acute phase of infection. The lowest concentration of SARS-CoV-2 viral copies this assay can detect is 250 copies / mL. A negative result does not preclude SARS-CoV-2 infection and should not be used as the sole basis for treatment or other patient management decisions.  A negative result may occur with improper specimen collection / handling, submission of specimen other than nasopharyngeal swab, presence of viral mutation(s) within the areas targeted by this assay, and inadequate number of viral copies (<250 copies /  mL). A negative result must be combined with clinical observations, patient history, and epidemiological information.  Fact Sheet for Patients:   StrictlyIdeas.no  Fact Sheet for Healthcare Providers: BankingDealers.co.za  This test is not yet approved or  cleared by the [place] FDA and has been authorized for detection and/or diagnosis of SARS-CoV-2 by FDA under an Emergency Use Authorization (EUA).  This EUA will remain in effect (meaning this test can be used) for the duration of the COVID-19 declaration under Section 564(b)(1) of the Act, 21 U.S.C. section 360bbb-3(b)(1), unless the authorization is terminated or revoked sooner.  Performed at [facility], [address] [place], [place] [address]     Coagulation Studies: No results for input(s): LABPROT, INR in the last 72 hours.  Urinalysis: No results for input(s): COLORURINE, LABSPEC, PHURINE, GLUCOSEU, HGBUR, BILIRUBINUR, KETONESUR, PROTEINUR, UROBILINOGEN, NITRITE, LEUKOCYTESUR in the last 72 hours.  Invalid input(s): APPERANCEUR    Imaging: No results found.   Medications:    . amLODipine  10 mg Oral Daily  . aspirin EC  81 mg Oral Daily  . brimonidine  1 drop Both Eyes BID  . budesonide  0.5 mg Nebulization Daily  . busPIRone  10 mg Oral BID  . calcium carbonate  750 mg Oral Daily  . doxycycline  100 mg Oral Q12H  . enoxaparin (LOVENOX) injection  40 mg Subcutaneous Q24H  . feeding supplement (ENSURE ENLIVE)  237 mL Oral BID BM  . fluticasone  2 spray Each Nare Daily  . levothyroxine  88 mcg Oral QAC breakfast  . magnesium oxide  400 mg Oral Daily  . metoprolol tartrate  50 mg Oral BID  . montelukast  10 mg Oral Daily  . multivitamin with minerals  1 tablet Oral Daily  .  pantoprazole  40 mg Oral Daily  . polyethylene glycol  17 g Oral Daily  . pravastatin  40 mg Oral QHS  . senna-docusate  2 tablet Oral BID  . tolvaptan  15 mg Oral Q24H    acetaminophen **OR** acetaminophen, albuterol, alum & mag hydroxide-simeth, bisacodyl, diphenhydrAMINE, guaiFENesin, hydrOXYzine, ondansetron **OR** ondansetron (ZOFRAN) IV, traZODone  Assessment/ Plan:  85 y.o. female with hypertension, glaucoma   admitted on 12/26/2019 for Hyponatremia [E87.1] SOB (shortness of breath) [R06.02] Exacerbation of asthma, unspecified asthma severity, unspecified whether persistent [J45.901]   #Hyponatremia Likely SIADH Serum sodium has worsened with normal saline administration which has been discontinued now. Chest x-ray negative for active cardiopulmonary disease.  No obvious mass. Discussed fluid restriction with patient to limit to 1 to 1.2 L/day Serum sodium improved to 125-126 with oral tolvaptan. Stop sodium chloride tablets as they may be making patient nauseous If serum sodium remains low tomorrow despite tolvaptan, will consider 3% IV Nacl admn We will follow closely    LOS: [place] 8/18/202111:02 AM  [place], [place]  Note: This note was prepared with Dragon dictation. Any transcription errors are unintentional

## 2019-12-31 NOTE — Progress Notes (Signed)
Pt called for nurse needing assistance to call family. Upon arrival to pt room pt was on the phone calling daughter about having an upset stomach. Pt had called prior to asking for medication for an upset stomach and medication was given see MAR and  saltine crackers as well as requested. Explained to pt if problem is still present that other medication are in place that can be given. Zofran 4 mg IVP was obtained and given to pt.

## 2019-12-31 NOTE — Progress Notes (Signed)
PROGRESS NOTE  ANYELINA CLAYCOMB XAJ:[id number] DOB: 04/04/1935 DOA: 12/26/2019 PCP: Derinda Late, MD  HPI/Recap of past 24 hours: CNO:BSJGGE [name] medical history significant forhypertension, glaucoma, with complaints of shortness of breath for the past several weeks, failing outpatient Bactrim treatment by PCP and referred to pulmonology who she saw on 12/24/2019 and prescribedLevaquinnow presents to the emergency room with protracted weakness and continued shortness of breath.  ED Course:On arrival, she is mildly tachypneic but with otherwise normal vitals. WBC elevated at 13,000, hemoglobin 10.8. Venous blood gas showed normal pH of 7.43 with decreased PCO2 of 39. Blood work most notable for sodium of 115, down from 125 at her doctor's office on 12/19/2019. Creatinine was 1.05 which is about her baseline. Chest x-ray showed no acute process. Patient given a normal saline bolus. Hospitalist consulted for admission.  12/30/19: Seen and examined with her son at bedside.  Some anxiety noted.  States she can't sleep at night.  Added trazodone nightly as needed for sleep.  Faint rales noted at bases on auscultation.  Will DC IV fluids and start salt tablets.  Last serum sodium 120.  Nephrology assisting.  Continue to closely monitor serum sodium, no more than 8 mEq increase in the next 24H.   12/31/2019: Patient was seen and examined, son present at bedside, serum sodium 124, Stating she would like to be discharged home soon as possible--nephrology has recommended [place] No improvement on salt tablets   Assessment/Plan: Active Problems:   Hyponatremia   Shortness of breath   HTN (hypertension)   Hypothyroidism   Hypovolemic hyponatremia, symptomatic -likely SIADH Hemodynamically stable, continues complain generalized weaknesses - Presented with sodium 115 >>> 125125,120,  124 today - symptomatic for shortness of breath and weakness TSH normal -Possibly  secondary to HCTZ, continue to hold -IV fluids, nephrology added tolvaptan -Recommend continue sodium tablets  -Serum sodium 125, then down trended to 120 Now off IV fluid normal saline Started sodium tablet 2 g 3 times daily Start fluid restriction less than 1.5 L/24 hour Continue to monitor serum sodium, every 4 hours. -Appreciate nephrology's assistance.  Acute asthma exacerbation Monitoring closely, as needed DuoNeb -Continue oral antibiotics and steroids -Presented with dyspnea with minimal exertion - -Continue oral steroids, switched to doxycycline Continue bronchodilators Maintain O2 saturation greater than 92% Obtain home O2 evaluation for DC planning Will need to follow-up with her pulmonologist outpatient.  Acute hypoxic respiratory failure likely secondary to acute asthma exacerbation -Remained stable on room air Currently on RA with O2 saturation 98%. Home O2 eval for DC planning  Improving leukocytosis, possibly reactive in the setting of oral steroids -Monitoring WBC No clear evidence of lobular pulmonary infiltrates on chest x-ray Continue doxycycline Procalcitonin less than 0.10  HTN (hypertension) BP is not at goal Increase metoprolol to 50 mg twice daily--we will continue Continue to hold off HCTZ due to hyponatremia.  Hypothyroidism TSH 2.2 -Continue levothyroxine  Situational insomnia Start trazodone nightly as needed Ambien did not appear to help her.    DVT prophylaxis: Lovenox subcu daily Code Status: full code  Family Communication:   Updated her son at bedside.   Consults called: Nephrology   Status is: Inpatient    Dispo:  Patient From: Home  Planned Disposition: Home with Health Care Svc  Expected discharge date: 12/31/19  Medically stable for discharge: No, ongoing management of hypovolemic hyponatremia.         Objective: Vitals:   12/31/19 0408 12/31/19 0500 12/31/19 3662  12/31/19 1144  BP: (!) 164/80  (!)  170/94 (!) 149/69  Pulse: 75  83 69  Resp: 17  18 17   Temp: 98.5 F (36.9 C)  (!) 97.4 F (36.3 C) 98 F (36.7 C)  TempSrc:   Oral Oral  SpO2: 97%  98% 97%  Weight:  89 kg    Height:        Intake/Output Summary (Last 24 hours) at 12/31/2019 1146 Last data filed at 12/31/2019 1026 Gross per 24 hour  Intake 0 ml  Output 1775 ml  Net -1775 ml   Filed Weights   12/29/19 2310 12/30/19 0248 12/31/19 0500  Weight: 88.1 kg 88.9 kg 89 kg    Exam:     Physical Exam:   General:  Alert, oriented, cooperative, no distress;   HEENT:  Normocephalic, PERRL, otherwise with in Normal limits   Neuro:  CNII-XII intact. , normal motor and sensation, reflexes intact   Lungs:   Clear to auscultation BL, Respirations unlabored, no wheezes / crackles  Cardio:    S1/S2, RRR, No murmure, No Rubs or Gallops   Abdomen:   Soft, non-tender, bowel sounds active all four quadrants,  no guarding or peritoneal signs.  Muscular skeletal:  Limited exam - in bed, able to move all 4 extremities, generalized weaknesses ambulates with assistive walker , 2+ pulses,  symmetric, No pitting edema  Skin:  Dry, warm to touch, negative for any Rashes, No open wounds  Wounds: Please see nursing documentation          Data Reviewed: CBC: Recent Labs  Lab 12/26/19 2226 12/28/19 0455 12/29/19 0404 12/30/19 0604 12/31/19 0458  WBC 13.1* 18.0* 13.4* 14.9* 13.4*  NEUTROABS 9.9* 12.9* 8.4* 9.6* 8.0*  HGB 10.8* 10.2* 10.0* 11.9* 12.3  HCT 29.5* 27.3* 28.4* 33.3* 35.3*  MCV 89.1 86.7 91.6 90.2 91.5  PLT 465* 492* 472* 535* 814*   Basic Metabolic Panel: Recent Labs  Lab 12/28/19 0455 12/28/19 1613 12/29/19 0404 12/29/19 0404 12/29/19 2019 12/29/19 2019 12/30/19 0604 12/30/19 1052 12/30/19 1911 12/30/19 2107 12/31/19 0013 12/31/19 0458 12/31/19 0804  NA 124*   < > 125*   < > 122*   < > 122*  123*   < > 126* 125* 125* 125* 124*  K 3.0*  --  4.1  --  4.2  --  4.5  --   --   --   --  4.3  --   CL  91*  --  94*  --  91*  --  92*  --   --   --   --  92*  --   CO2 25  --  23  --  21*  --  20*  --   --   --   --  24  --   GLUCOSE 108*  --  96  --  117*  --  119*  --   --   --   --  122*  --   BUN 8  --  8  --  7*  --  7*  --   --   --   --  10  --   CREATININE 0.75  --  0.69  --  0.74  --  0.68  --   --   --   --  0.90  --   CALCIUM 8.5*  --  8.1*  --  8.7*  --  8.9  --   --   --   --  9.5  --   MG 1.2*  --   --   --   --   --   --   --   --   --   --   --   --   PHOS 2.4*  --   --   --   --   --   --   --   --   --   --   --   --    < > = values in this interval not displayed.   GFR: Estimated Creatinine Clearance: 48.9 mL/min (by C-G formula based on SCr of 0.9 mg/dL). Liver Function Tests: Recent Labs  Lab 12/26/19 2226  AST 22  ALT 14  ALKPHOS 100  BILITOT 0.7  PROT 6.9  ALBUMIN 3.3*   Urine analysis:    Component Value Date/Time   COLORURINE STRAW (A) 12/27/2019 0426   APPEARANCEUR CLEAR (A) 12/27/2019 0426   APPEARANCEUR Hazy 12/26/2011 1450   LABSPEC 1.004 (L) 12/27/2019 0426   LABSPEC 1.016 12/26/2011 1450   PHURINE 7.0 12/27/2019 0426   GLUCOSEU NEGATIVE 12/27/2019 0426   GLUCOSEU Negative 12/26/2011 1450   HGBUR NEGATIVE 12/27/2019 0426   BILIRUBINUR NEGATIVE 12/27/2019 0426   BILIRUBINUR Negative 12/26/2011 1450   KETONESUR 5 (A) 12/27/2019 0426   PROTEINUR NEGATIVE 12/27/2019 0426   NITRITE NEGATIVE 12/27/2019 0426   LEUKOCYTESUR NEGATIVE 12/27/2019 0426   LEUKOCYTESUR 3+ 12/26/2011 1450   Sepsis Labs: @LABRCNTIP (procalcitonin:4,lacticidven:4)  ) Recent Results (from the past 240 hour(s))  SARS Coronavirus 2 by RT PCR (hospital order, performed in [place] hospital lab) Nasopharyngeal Nasopharyngeal Swab     Status: None   Collection Time: 12/26/19 10:26 PM   Specimen: Nasopharyngeal Swab  Result Value Ref Range Status   SARS Coronavirus 2 NEGATIVE NEGATIVE Final    Comment: (NOTE) SARS-CoV-2 target nucleic acids are NOT DETECTED.  The  SARS-CoV-2 RNA is generally detectable in upper and lower respiratory specimens during the acute phase of infection. The lowest concentration of SARS-CoV-2 viral copies this assay can detect is 250 copies / mL. A negative result does not preclude SARS-CoV-2 infection and should not be used as the sole basis for treatment or other patient management decisions.  A negative result may occur with improper specimen collection / handling, submission of specimen other than nasopharyngeal swab, presence of viral mutation(s) within the areas targeted by this assay, and inadequate number of viral copies (<250 copies / mL). A negative result must be combined with clinical observations, patient history, and epidemiological information.  Fact Sheet for Patients:   StrictlyIdeas.no  Fact Sheet for Healthcare Providers: BankingDealers.co.za  This test is not yet approved or  cleared by the [place] FDA and has been authorized for detection and/or diagnosis of SARS-CoV-2 by FDA under an Emergency Use Authorization (EUA).  This EUA will remain in effect (meaning this test can be used) for the duration of the COVID-19 declaration under Section 564(b)(1) of the Act, 21 U.S.C. section 360bbb-3(b)(1), unless the authorization is terminated or revoked sooner.  Performed at [facility], [address], [place], [place] [address]       Studies: No results found.  Scheduled Meds: . amLODipine  10 mg Oral Daily  . aspirin EC  81 mg Oral Daily  . brimonidine  1 drop Both Eyes BID  . budesonide  0.5 mg Nebulization Daily  . busPIRone  10 mg Oral BID  . calcium carbonate  750 mg Oral Daily  .  doxycycline  100 mg Oral Q12H  . enoxaparin (LOVENOX) injection  40 mg Subcutaneous Q24H  . feeding supplement (ENSURE ENLIVE)  237 mL Oral BID BM  . fluticasone  2 spray Each Nare Daily  . levothyroxine  88 mcg Oral QAC breakfast  . magnesium oxide   400 mg Oral Daily  . metoprolol tartrate  50 mg Oral BID  . montelukast  10 mg Oral Daily  . multivitamin with minerals  1 tablet Oral Daily  . pantoprazole  40 mg Oral Daily  . polyethylene glycol  17 g Oral Daily  . pravastatin  40 mg Oral QHS  . senna-docusate  2 tablet Oral BID  . tolvaptan  15 mg Oral Q24H    Continuous Infusions:    LOS: 5 days     Deatra James, MD [facility] Pager [phone]  If 7PM-7AM, please contact night-coverage [email or web address] Password [facility] 12/31/2019, 11:46 AM

## 2019-12-31 NOTE — TOC Progression Note (Signed)
Transition of Care [facility]) - Progression Note    Patient Details  Name: Cynthia Mahoney MRN: [id number] Date of Birth: 1934/06/08  Transition [facility]) CM/SW [place], RN Phone Number: 12/31/2019, 1:29 PM   Expected Discharge Plan: [place] Barriers to Discharge: Barriers Resolved  Expected Discharge Plan and Services Expected Discharge Plan: [place] In-house Referral: Clinical Social Work   Post Acute Care Choice: [place] arrangements for the past 2 months: [place]: PT, OT, RN, Nurse's Aide [place] Agency: [facility] [employer]) Date [place]: 12/30/19 Time [place]: 1236 Representative spoke with at [place]: [place] ([place]) Interventions    Readmission Risk Interventions No flowsheet data found.

## 2020-01-01 LAB — BASIC METABOLIC PANEL
Anion gap: 10 (ref 5–15)
BUN: 12 mg/dL (ref 8–23)
CO2: 24 mmol/L (ref 22–32)
Calcium: 9 mg/dL (ref 8.9–10.3)
Chloride: 96 mmol/L — ABNORMAL LOW (ref 98–111)
Creatinine, Ser: 1 mg/dL (ref 0.44–1.00)
GFR calc Af Amer: >60 mL/min (ref >60)
GFR calc non Af Amer: 52 mL/min — ABNORMAL LOW (ref >60)
Glucose, Bld: 97 mg/dL (ref 70–99)
Potassium: 4.3 mmol/L (ref 3.5–5.1)
Sodium: 130 mmol/L — ABNORMAL LOW (ref 135–145)

## 2020-01-01 LAB — CBC WITH DIFFERENTIAL/PLATELET
Abs Immature Granulocytes: 0.29 10*3/uL — ABNORMAL HIGH (ref 0.00–0.07)
Basophils Absolute: 0.1 10*3/uL (ref 0.0–0.1)
Basophils Relative: 1 %
Eosinophils Absolute: 0.3 10*3/uL (ref 0.0–0.5)
Eosinophils Relative: 3 %
HCT: 32.8 % — ABNORMAL LOW (ref 36.0–46.0)
Hemoglobin: 11.3 g/dL — ABNORMAL LOW (ref 12.0–15.0)
Immature Granulocytes: 2 %
Lymphocytes Relative: 32 %
Lymphs Abs: 3.9 10*3/uL (ref 0.7–4.0)
MCH: 32.1 pg (ref 26.0–34.0)
MCHC: 34.5 g/dL (ref 30.0–36.0)
MCV: 93.2 fL (ref 80.0–100.0)
Monocytes Absolute: 1.2 10*3/uL — ABNORMAL HIGH (ref 0.1–1.0)
Monocytes Relative: 10 %
Neutro Abs: 6.3 10*3/uL (ref 1.7–7.7)
Neutrophils Relative %: 52 %
Platelets: 414 10*3/uL — ABNORMAL HIGH (ref 150–400)
RBC: 3.52 MIL/uL — ABNORMAL LOW (ref 3.87–5.11)
RDW: 13.1 % (ref 11.5–15.5)
WBC: 12.3 10*3/uL — ABNORMAL HIGH (ref 4.0–10.5)
nRBC: 0 % (ref 0.0–0.2)

## 2020-01-01 MED ORDER — DOXYCYCLINE HYCLATE 100 MG PO TABS: 100.0000 mg | ORAL_TABLET | Freq: Two times a day (BID) | ORAL | 0 refills | Status: AC

## 2020-01-01 MED ORDER — FUROSEMIDE 40 MG PO TABS: 20.0000 mg | ORAL_TABLET | Freq: Every morning | ORAL | 1 refills | Status: AC

## 2020-01-01 MED ORDER — METOPROLOL TARTRATE 50 MG PO TABS: 50.0000 mg | ORAL_TABLET | Freq: Two times a day (BID) | ORAL | 1 refills | Status: AC

## 2020-01-01 MED ORDER — AMLODIPINE BESYLATE 10 MG PO TABS: 10.0000 mg | ORAL_TABLET | Freq: Every day | ORAL | 0 refills | Status: DC

## 2020-01-01 MED ORDER — ENSURE ENLIVE PO LIQD: 237.0000 mL | Freq: Two times a day (BID) | ORAL | 3 refills | Status: AC

## 2020-01-01 NOTE — Progress Notes (Signed)
[place], [place] 01/01/20  Subjective:   Hospital day # 6 Patient's son is in the room with her.   No further nausea after sodium chloride tablets were stopped Sodium improved to 130 today  08/18 0701 - 08/19 0700 In: -  Out: 300 [Urine:300] Lab Results  Component Value Date   CREATININE 1.00 01/01/2020   CREATININE 0.90 12/31/2019   CREATININE 0.68 12/30/2019     Objective:  Vital signs in last 24 hours:  Temp:  [97.5 F (36.4 C)-98.4 F (36.9 C)] 98.4 F (36.9 C) (08/19 0817) Pulse Rate:  [79-82] 79 (08/19 0817) Resp:  [16] 16 (08/18 1547) BP: (112-148)/(64-101) 112/96 (08/19 0817) SpO2:  [94 %-98 %] 95 % (08/19 0817)  Weight change:  Filed Weights   12/29/19 2310 12/30/19 0248 12/31/19 0500  Weight: 88.1 kg 88.9 kg 89 kg    Intake/Output:    Intake/Output Summary (Last 24 hours) at 01/01/2020 1403 Last data filed at 01/01/2020 1000 Gross per 24 hour  Intake 600 ml  Output 0 ml  Net 600 ml   Physical Exam: General:  No acute distress, sitting up in recliner chair  HEENT  anicteric, moist oral mucous membrane  Pulm/lungs  normal breathing effort, lungs are clear to auscultation  CVS/Heart  regular rhythm, no rub or gallop  Abdomen:   Soft, nontender  Extremities:  No peripheral edema  Neurologic:  Alert and oriented, able to answer questions appropriately   Skin:  No acute rashes     Basic Metabolic Panel:  Recent Labs  Lab 12/28/19 0455 12/28/19 1613 12/29/19 0404 12/29/19 0404 12/29/19 2019 12/29/19 2019 12/30/19 0604 12/30/19 1052 12/31/19 0458 12/31/19 0804 12/31/19 1329 12/31/19 2116 01/01/20 0639  NA 124*   < > 125*   < > 122*   < > 122*  123*   < > 125* 124* 126* 126* 130*  K 3.0*  --  4.1  --  4.2  --  4.5  --  4.3  --   --   --  4.3  CL 91*  --  94*  --  91*  --  92*  --  92*  --   --   --  96*  CO2 25  --  23  --  21*  --  20*  --  24  --   --   --  24  GLUCOSE 108*  --  96  --  117*  --  119*   --  122*  --   --   --  97  BUN 8  --  8  --  7*  --  7*  --  10  --   --   --  12  CREATININE 0.75  --  0.69  --  0.74  --  0.68  --  0.90  --   --   --  1.00  CALCIUM 8.5*  --  8.1*   < > 8.7*   < > 8.9  --  9.5  --   --   --  9.0  MG 1.2*  --   --   --   --   --   --   --   --   --   --   --   --   PHOS 2.4*  --   --   --   --   --   --   --   --   --   --   --   --    < > =  values in this interval not displayed.     CBC: Recent Labs  Lab 12/28/19 0455 12/29/19 0404 12/30/19 0604 12/31/19 0458 01/01/20 0639  WBC 18.0* 13.4* 14.9* 13.4* 12.3*  NEUTROABS 12.9* 8.4* 9.6* 8.0* 6.3  HGB 10.2* 10.0* 11.9* 12.3 11.3*  HCT 27.3* 28.4* 33.3* 35.3* 32.8*  MCV 86.7 91.6 90.2 91.5 93.2  PLT 492* 472* 535* 513* 414*     No results found for: HEPBSAG, HEPBSAB, HEPBIGM    Microbiology:  Recent Results (from the past 240 hour(s))  SARS Coronavirus 2 by RT PCR (hospital order, performed in [facility] hospital lab) Nasopharyngeal Nasopharyngeal Swab     Status: None   Collection Time: 12/26/19 10:26 PM   Specimen: Nasopharyngeal Swab  Result Value Ref Range Status   SARS Coronavirus 2 NEGATIVE NEGATIVE Final    Comment: (NOTE) SARS-CoV-2 target nucleic acids are NOT DETECTED.  The SARS-CoV-2 RNA is generally detectable in upper and lower respiratory specimens during the acute phase of infection. The lowest concentration of SARS-CoV-2 viral copies this assay can detect is 250 copies / mL. A negative result does not preclude SARS-CoV-2 infection and should not be used as the sole basis for treatment or other patient management decisions.  A negative result may occur with improper specimen collection / handling, submission of specimen other than nasopharyngeal swab, presence of viral mutation(s) within the areas targeted by this assay, and inadequate number of viral copies (<250 copies / mL). A negative result must be combined with clinical observations, patient history, and  epidemiological information.  Fact Sheet for Patients:   StrictlyIdeas.no  Fact Sheet for Healthcare Providers: BankingDealers.co.za  This test is not yet approved or  cleared by the [place] FDA and has been authorized for detection and/or diagnosis of SARS-CoV-2 by FDA under an Emergency Use Authorization (EUA).  This EUA will remain in effect (meaning this test can be used) for the duration of the COVID-19 declaration under Section 564(b)(1) of the Act, 21 U.S.C. section 360bbb-3(b)(1), unless the authorization is terminated or revoked sooner.  Performed at [facility], [address] [place], [place] [address]     Coagulation Studies: No results for input(s): LABPROT, INR in the last 72 hours.  Urinalysis: No results for input(s): COLORURINE, LABSPEC, PHURINE, GLUCOSEU, HGBUR, BILIRUBINUR, KETONESUR, PROTEINUR, UROBILINOGEN, NITRITE, LEUKOCYTESUR in the last 72 hours.  Invalid input(s): APPERANCEUR    Imaging: No results found.   Medications:    . amLODipine  10 mg Oral Daily  . aspirin EC  81 mg Oral Daily  . brimonidine  1 drop Both Eyes BID  . budesonide  0.5 mg Nebulization Daily  . busPIRone  10 mg Oral BID  . calcium carbonate  750 mg Oral Daily  . doxycycline  100 mg Oral Q12H  . enoxaparin (LOVENOX) injection  40 mg Subcutaneous Q24H  . feeding supplement (ENSURE ENLIVE)  237 mL Oral BID BM  . levothyroxine  88 mcg Oral QAC breakfast  . magnesium oxide  400 mg Oral Daily  . metoprolol tartrate  50 mg Oral BID  . montelukast  10 mg Oral Daily  . multivitamin with minerals  1 tablet Oral Daily  . pantoprazole  40 mg Oral Daily  . polyethylene glycol  17 g Oral Daily  . pravastatin  40 mg Oral QHS  . senna-docusate  2 tablet Oral BID  . tolvaptan  15 mg Oral Q24H   acetaminophen **OR** acetaminophen, albuterol, alum & mag hydroxide-simeth, bisacodyl, diphenhydrAMINE, guaiFENesin,  hydrOXYzine, ondansetron **OR** ondansetron (ZOFRAN) IV, traZODone  Assessment/ Plan:  85 y.o. female with hypertension, glaucoma   admitted on 12/26/2019 for Hyponatremia [E87.1] SOB (shortness of breath) [R06.02] Exacerbation of asthma, unspecified asthma severity, unspecified whether persistent [J45.901]   #Hyponatremia Likely SIADH Serum sodium has worsened with normal saline administration which has been discontinued now. Chest x-ray negative for active cardiopulmonary disease.  No obvious mass. Discussed fluid restriction with patient to limit to about 1 L/day Serum sodium improved to 130 with oral tolvaptan-second dose today Nausea improved after discontinuing sodium chloride tablets. Follow-up outpatient.    LOS: 6 Carinne Brandenburger 8/19/20212:03 PM  [place], [place]  Note: This note was prepared with Dragon dictation. Any transcription errors are unintentional

## 2020-01-01 NOTE — Discharge Summary (Signed)
Physician Discharge Summary [facility]    Patient: DYSTANY DUFFY                   Admit date: 12/26/2019   DOB: [birth year]             Discharge date:01/01/2020/8:02 AM INO:[id number]                          PCP: Derinda Late, MD  Disposition: Home with [facility]  Recommendations for Outpatient Follow-up:   . Follow up: in 1 week  Discharge Condition: Stable   Code Status:   Code Status: Full Code  Diet recommendation: Regular healthy diet   Discharge Diagnoses:    Active Problems:   Hyponatremia   Shortness of breath   HTN (hypertension)   Hypothyroidism   History of Present Illness/ Hospital Course Kathleen Argue Summary:   SJG:GEZMOQ [name] medical history significant forhypertension, glaucoma, with complaints of shortness of breath for the past several weeks, failing outpatient Bactrim treatment by PCP and referred to pulmonology who she saw on 12/24/2019 and prescribedLevaquinnow presents to the emergency room with protracted weakness and continued shortness of breath.  ED Course:On arrival, she is mildly tachypneic but with otherwise normal vitals. WBC elevated at 13,000, hemoglobin 10.8. Venous blood gas showed normal pH of 7.43 with decreased PCO2 of 39. Blood work most notable for sodium of 115, down from 125 at her doctor's office on 12/19/2019. Creatinine was 1.05 which is about her baseline. Chest x-ray showed no acute process. Patient given a normal saline bolus. Hospitalist consulted for admission.  12/30/19: Seen and examined with her son at bedside.  Some anxiety noted.  States she can't sleep at night.  Added trazodone nightly as needed for sleep.  Faint rales noted at bases on auscultation.  Will DC IV fluids and start salt tablets.  Last serum sodium 120.  Nephrology assisting.  Continue to closely monitor serum sodium, no more than 8 mEq increase in the next 24H.   12/31/2019: Patient was seen and examined, son present at  bedside, serum sodium 124, Stating she would like to be discharged home soon as possible--nephrology has recommended [facility] No improvement on salt tablets  home,  01/01/2020 - -patient sodium is improved to 130, responded well to tolvaptan.  requested to be discharged   Hypovolemic hyponatremia, symptomatic -likely SIADH Hemodynamically stable, continues complain generalized weaknesses - Presented with sodium 115 >>> 125125,120,  124 >>>130 today - symptomatic for shortness of breath and weakness TSH normal -Possibly secondary to HCTZ, >> D/ced -IV fluids, nephrology added tolvaptan -Recommend continue sodium tablets  -Serum sodium 125, then down trended to 120 Now off IV fluid normal saline Started sodium tablet 2 g 3 times daily Start fluid restriction less than 1.5 L/24 hour Continue to monitor serum sodium, every 4 hours. -Appreciate nephrology's assistance.  Acute asthma exacerbation Monitoring closely, as needed DuoNeb -Continue oral antibiotics and steroids -Presented with dyspnea with minimal exertion - -Continue oral steroids, switched to doxycycline Continue bronchodilators Maintain O2 saturation greater than 92% Obtain home O2 evaluation for DC planning Will need to follow-up with her pulmonologist outpatient.  Acute hypoxic respiratory failure likely secondary to acute asthma exacerbation -Remained stable on room air Currently on RA with O2 saturation 98%. Home O2 eval for DC planning  Improving leukocytosis, possibly reactive in the setting of oral steroids -Monitoring WBC No clear evidence of lobular pulmonary infiltrates on chest  x-ray Continue doxycycline Procalcitonin less than 0.10  HTN (hypertension) BP is not at goal Increase metoprolol to 50 mg twice daily--we will continue D/ced HCTZ due to hyponatremia.  Hypothyroidism TSH 2.2 -Continue levothyroxine  Situational insomnia Start trazodone nightly as needed Ambien did not appear to  help her.  Severe debility -Continue PT OT, home health has been arranged Fall precautions  Code Status:full code Family Communication: Updated her son at bedside.   Consults called:Nephrology   Status is: Inpatient    Dispo:             Patient From: Home             Planned Disposition: Home with [facility]              Discharge Instructions:   Discharge Instructions    (HEART FAILURE PATIENTS) Call MD:  Anytime you have any of the following symptoms: 1) 3 pound weight gain in 24 hours or 5 pounds in 1 week 2) shortness of breath, with or without a dry hacking cough 3) swelling in the hands, feet or stomach 4) if you have to sleep on extra pillows at night in order to breathe.   Complete by: As directed    Activity as tolerated - No restrictions   Complete by: As directed    Call MD for:  difficulty breathing, headache or visual disturbances   Complete by: As directed    Call MD for:  persistant dizziness or light-headedness   Complete by: As directed    Call MD for:  persistant nausea and vomiting   Complete by: As directed    Diet - low sodium heart healthy   Complete by: As directed    Diet general   Complete by: As directed    Discharge instructions   Complete by: As directed    Follow-up PCP, nephrologist within 1 week, repeat labs CMP within 1 week.   Increase activity slowly   Complete by: As directed    Increase activity slowly   Complete by: As directed        Medication List    STOP taking these medications   levofloxacin 500 MG tablet Commonly known as: LEVAQUIN   losartan-hydrochlorothiazide 50-12.5 MG tablet Commonly known as: HYZAAR   meloxicam 7.5 MG tablet Commonly known as: MOBIC     TAKE these medications   albuterol 108 (90 Base) MCG/ACT inhaler Commonly known as: VENTOLIN HFA SMARTSIG:2 Puff(s) By Mouth Every 6 Hours PRN   amLODipine 10 MG tablet Commonly known as: NORVASC Take 1 tablet (10 mg total) by mouth  daily.   aspirin 81 MG tablet Take 81 mg by mouth daily.   brimonidine 0.2 % ophthalmic solution Commonly known as: ALPHAGAN Place 1 drop into both eyes 2 (two) times daily.   budesonide 0.5 MG/2ML nebulizer solution Commonly known as: PULMICORT Take by nebulization.   busPIRone 10 MG tablet Commonly known as: BUSPAR Take 10 mg by mouth 2 (two) times daily.   calcium carbonate 600 MG Tabs tablet Commonly known as: OS-CAL Take 600 mg by mouth daily.   cetirizine 10 MG tablet Commonly known as: ZYRTEC TAKE 1 TABLET (10 MG TOTAL) BY MOUTH ONCE DAILY   dorzolamide-timolol 22.3-6.8 MG/ML ophthalmic solution Commonly known as: COSOPT Place 1 drop into both eyes 2 (two) times daily.   doxycycline 100 MG tablet Commonly known as: VIBRA-TABS Take 1 tablet (100 mg total) by mouth every 12 (twelve) hours for 7 days.  Dupilumab 300 MG/2ML Sopn Inject 2 mLs (300 mg total) subcutaneously every 14 (fourteen) days   feeding supplement (ENSURE ENLIVE) Liqd Take 237 mLs by mouth 2 (two) times daily between meals.   furosemide 40 MG tablet Commonly known as: LASIX Take 0.5 tablets (20 mg total) by mouth every morning. What changed: how much to take   gabapentin 300 MG capsule Commonly known as: NEURONTIN Take 300-600 mg by mouth 3 (three) times daily.   latanoprost 0.005 % ophthalmic solution Commonly known as: XALATAN Place 1 drop into both eyes at bedtime.   levothyroxine 88 MCG tablet Commonly known as: SYNTHROID Take 88 mcg by mouth daily before breakfast.   magnesium oxide 400 MG tablet Commonly known as: MAG-OX Take 400 mg by mouth daily.   metoprolol tartrate 50 MG tablet Commonly known as: LOPRESSOR Take 1 tablet (50 mg total) by mouth 2 (two) times daily. What changed:   medication strength  how much to take   montelukast 10 MG tablet Commonly known as: SINGULAIR Take 1 tablet by mouth at bedtime.   multivitamin with minerals Tabs tablet Take 1 tablet  by mouth daily.   omeprazole 20 MG capsule Commonly known as: PRILOSEC Take 20 mg by mouth 2 (two) times daily before a meal.   ondansetron 4 MG disintegrating tablet Commonly known as: ZOFRAN-ODT Take 4 mg by mouth every 8 (eight) hours as needed.   pravastatin 40 MG tablet Commonly known as: PRAVACHOL Take 40 mg by mouth at bedtime.   SLEEP AID PO Take 1 tablet by mouth at bedtime.   VISION FORMULA/LUTEIN PO Take 1 tablet by mouth daily.       Allergies  Allergen Reactions  . Amoxicillin Hives and Rash  . Erythromycin Hives and Rash     Procedures /Studies:   CT Head Wo Contrast  Result Date: 12/17/2019 CLINICAL DATA:  Syncope, recurrent dizziness EXAM: CT HEAD WITHOUT CONTRAST CT MAXILLOFACIAL WITHOUT CONTRAST TECHNIQUE: Multidetector CT imaging of the head and maxillofacial structures were performed using the standard protocol without intravenous contrast. Multiplanar CT image reconstructions of the maxillofacial structures were also generated. COMPARISON:  None. FINDINGS: CT HEAD FINDINGS Brain: No evidence of acute infarction, hemorrhage, extra-axial collection, ventriculomegaly, or mass effect. Generalized cerebral atrophy. Periventricular white matter low attenuation likely secondary to microangiopathy. Mineralization of bilateral basal ganglia. Vascular: Cerebrovascular atherosclerotic calcifications are noted. Skull: Negative for fracture or focal lesion. Sinuses/Orbits: Visualized portions of the orbits are unremarkable. Visualized portions of the paranasal sinuses are unremarkable. Visualized portions of the mastoid air cells are unremarkable. Other: None. CT MAXILLOFACIAL FINDINGS Osseous: Subtle nondisplaced fracture of the right nasal bone. No other fracture or mandibular dislocation. No destructive process. Orbits: Negative. No traumatic or inflammatory finding. Sinuses: Paranasal sinuses are clear. Rightward deviation of the nasal septum. Soft tissues: Soft tissue  laceration across the bridge of the nose. IMPRESSION: 1. No acute intracranial pathology. 2. Subtle nondisplaced fracture of the right nasal bone. 3. Soft tissue laceration across the bridge of the nose. Electronically Signed   By: Kathreen Devoid   On: 12/17/2019 14:35   DG Chest Portable 1 View  Result Date: 12/26/2019 CLINICAL DATA:  Shortness of breath EXAM: PORTABLE CHEST 1 VIEW COMPARISON:  12/28/2016 FINDINGS: Heart and mediastinal contours are within normal limits. No focal opacities or effusions. No acute bony abnormality. Degenerative changes in the shoulders bilaterally. IMPRESSION: No active cardiopulmonary disease. Electronically Signed   By: Rolm Baptise M.D.   On: 12/26/2019 22:31  CT Maxillofacial Wo Contrast  Result Date: 12/17/2019 CLINICAL DATA:  Syncope, recurrent dizziness EXAM: CT HEAD WITHOUT CONTRAST CT MAXILLOFACIAL WITHOUT CONTRAST TECHNIQUE: Multidetector CT imaging of the head and maxillofacial structures were performed using the standard protocol without intravenous contrast. Multiplanar CT image reconstructions of the maxillofacial structures were also generated. COMPARISON:  None. FINDINGS: CT HEAD FINDINGS Brain: No evidence of acute infarction, hemorrhage, extra-axial collection, ventriculomegaly, or mass effect. Generalized cerebral atrophy. Periventricular white matter low attenuation likely secondary to microangiopathy. Mineralization of bilateral basal ganglia. Vascular: Cerebrovascular atherosclerotic calcifications are noted. Skull: Negative for fracture or focal lesion. Sinuses/Orbits: Visualized portions of the orbits are unremarkable. Visualized portions of the paranasal sinuses are unremarkable. Visualized portions of the mastoid air cells are unremarkable. Other: None. CT MAXILLOFACIAL FINDINGS Osseous: Subtle nondisplaced fracture of the right nasal bone. No other fracture or mandibular dislocation. No destructive process. Orbits: Negative. No traumatic or  inflammatory finding. Sinuses: Paranasal sinuses are clear. Rightward deviation of the nasal septum. Soft tissues: Soft tissue laceration across the bridge of the nose. IMPRESSION: 1. No acute intracranial pathology. 2. Subtle nondisplaced fracture of the right nasal bone. 3. Soft tissue laceration across the bridge of the nose. Electronically Signed   By: Kathreen Devoid   On: 12/17/2019 14:35     Subjective:   Patient was seen and examined 01/01/2020, 8:02 AM Patient stable today. No acute distress.  No issues overnight Stable for discharge.  Discharge Exam:    Vitals:   12/31/19 1547 12/31/19 1931 12/31/19 1933 01/01/20 0459  BP: 129/67 (!) 138/101 136/64 (!) 148/74  Pulse: 81 82 80 81  Resp: 16     Temp: 97.7 F (36.5 C) (!) 97.5 F (36.4 C)  98 F (36.7 C)  TempSrc: Oral Oral  Oral  SpO2: 96% 94% 98% 96%  Weight:      Height:        General: Pt lying comfortably in bed & appears in no obvious distress. Cardiovascular: S1 & S2 heard, RRR, S1/S2 +. No murmurs, rubs, gallops or clicks. No JVD or pedal edema. Respiratory: Clear to auscultation without wheezing, rhonchi or crackles. No increased work of breathing. Abdominal:  Non-distended, non-tender & soft. No organomegaly or masses appreciated. Normal bowel sounds heard. CNS: Alert and oriented. No focal deficits. Extremities: no edema, no cyanosis    The results of significant diagnostics from this hospitalization (including imaging, microbiology, ancillary and laboratory) are listed below for reference.      Microbiology:   Recent Results (from the past 240 hour(s))  SARS Coronavirus 2 by RT PCR (hospital order, performed in [facility] hospital lab) Nasopharyngeal Nasopharyngeal Swab     Status: None   Collection Time: 12/26/19 10:26 PM   Specimen: Nasopharyngeal Swab  Result Value Ref Range Status   SARS Coronavirus 2 NEGATIVE NEGATIVE Final    Comment: (NOTE) SARS-CoV-2 target nucleic acids are NOT  DETECTED.  The SARS-CoV-2 RNA is generally detectable in upper and lower respiratory specimens during the acute phase of infection. The lowest concentration of SARS-CoV-2 viral copies this assay can detect is 250 copies / mL. A negative result does not preclude SARS-CoV-2 infection and should not be used as the sole basis for treatment or other patient management decisions.  A negative result may occur with improper specimen collection / handling, submission of specimen other than nasopharyngeal swab, presence of viral mutation(s) within the areas targeted by this assay, and inadequate number of viral copies (<250 copies / mL). A negative  result must be combined with clinical observations, patient history, and epidemiological information.  Fact Sheet for Patients:   StrictlyIdeas.no  Fact Sheet for Healthcare Providers: BankingDealers.co.za  This test is not yet approved or  cleared by the [place] FDA and has been authorized for detection and/or diagnosis of SARS-CoV-2 by FDA under an Emergency Use Authorization (EUA).  This EUA will remain in effect (meaning this test can be used) for the duration of the COVID-19 declaration under Section 564(b)(1) of the Act, 21 U.S.C. section 360bbb-3(b)(1), unless the authorization is terminated or revoked sooner.  Performed at [facility], [address] [place], [place] [address]      Labs:   CBC: Recent Labs  Lab 12/28/19 0455 12/29/19 0404 12/30/19 0604 12/31/19 0458 01/01/20 0639  WBC 18.0* 13.4* 14.9* 13.4* 12.3*  NEUTROABS 12.9* 8.4* 9.6* 8.0* 6.3  HGB 10.2* 10.0* 11.9* 12.3 11.3*  HCT 27.3* 28.4* 33.3* 35.3* 32.8*  MCV 86.7 91.6 90.2 91.5 93.2  PLT 492* 472* 535* 513* 629*   Basic Metabolic Panel: Recent Labs  Lab 12/28/19 0455 12/28/19 1613 12/29/19 0404 12/29/19 0404 12/29/19 2019 12/29/19 2019 12/30/19 0604 12/30/19 1052 12/31/19 0458  12/31/19 0804 12/31/19 1329 12/31/19 2116 01/01/20 0639  NA 124*   < > 125*   < > 122*   < > 122*  123*   < > 125* 124* 126* 126* 130*  K 3.0*  --  4.1  --  4.2  --  4.5  --  4.3  --   --   --  4.3  CL 91*  --  94*  --  91*  --  92*  --  92*  --   --   --  96*  CO2 25  --  23  --  21*  --  20*  --  24  --   --   --  24  GLUCOSE 108*  --  96  --  117*  --  119*  --  122*  --   --   --  97  BUN 8  --  8  --  7*  --  7*  --  10  --   --   --  12  CREATININE 0.75  --  0.69  --  0.74  --  0.68  --  0.90  --   --   --  1.00  CALCIUM 8.5*  --  8.1*  --  8.7*  --  8.9  --  9.5  --   --   --  9.0  MG 1.2*  --   --   --   --   --   --   --   --   --   --   --   --   PHOS 2.4*  --   --   --   --   --   --   --   --   --   --   --   --    < > = values in this interval not displayed.   Liver Function Tests: Recent Labs  Lab 12/26/19 2226  AST 22  ALT 14  ALKPHOS 100  BILITOT 0.7  PROT 6.9  ALBUMIN 3.3*   BNP (last 3 results) Recent Labs    12/26/19 2141  BNP 39.7   Cardiac Enzymes: No results for input(s): CKTOTAL, CKMB, CKMBINDEX, TROPONINI in the last 168 hours. CBG: No results for input(s):  GLUCAP in the last 168 hours. Hgb A1c No results for input(s): HGBA1C in the last 72 hours. Lipid Profile No results for input(s): CHOL, HDL, LDLCALC, TRIG, CHOLHDL, LDLDIRECT in the last 72 hours. Thyroid function studies No results for input(s): TSH, T4TOTAL, T3FREE, THYROIDAB in the last 72 hours.  Invalid input(s): FREET3 Anemia work up No results for input(s): VITAMINB12, FOLATE, FERRITIN, TIBC, IRON, RETICCTPCT in the last 72 hours. Urinalysis    Component Value Date/Time   COLORURINE STRAW (A) 12/27/2019 0426   APPEARANCEUR CLEAR (A) 12/27/2019 0426   APPEARANCEUR Hazy 12/26/2011 1450   LABSPEC 1.004 (L) 12/27/2019 0426   LABSPEC 1.016 12/26/2011 1450   PHURINE 7.0 12/27/2019 0426   GLUCOSEU NEGATIVE 12/27/2019 0426   GLUCOSEU Negative 12/26/2011 1450   HGBUR NEGATIVE  12/27/2019 0426   BILIRUBINUR NEGATIVE 12/27/2019 0426   BILIRUBINUR Negative 12/26/2011 1450   KETONESUR 5 (A) 12/27/2019 0426   PROTEINUR NEGATIVE 12/27/2019 0426   NITRITE NEGATIVE 12/27/2019 0426   LEUKOCYTESUR NEGATIVE 12/27/2019 0426   LEUKOCYTESUR 3+ 12/26/2011 1450         Time coordinating discharge: Over 55 minutes  SIGNED: Deatra James, MD, FACP, FHM. [facility],  Please use [email or web address] to Page If 7PM-7AM, please contact night-coverage [email or web address], Password [facility] 01/01/2020, 8:02 AM

## 2020-01-02 DIAGNOSIS — G43909 Migraine, unspecified, not intractable, without status migrainosus: Secondary | ICD-10-CM | POA: Diagnosis not present

## 2020-01-02 DIAGNOSIS — F419 Anxiety disorder, unspecified: Secondary | ICD-10-CM | POA: Diagnosis not present

## 2020-01-02 DIAGNOSIS — E871 Hypo-osmolality and hyponatremia: Secondary | ICD-10-CM | POA: Diagnosis not present

## 2020-01-02 DIAGNOSIS — E039 Hypothyroidism, unspecified: Secondary | ICD-10-CM | POA: Diagnosis not present

## 2020-01-02 DIAGNOSIS — S41112D Laceration without foreign body of left upper arm, subsequent encounter: Secondary | ICD-10-CM | POA: Diagnosis not present

## 2020-01-02 DIAGNOSIS — H409 Unspecified glaucoma: Secondary | ICD-10-CM | POA: Diagnosis not present

## 2020-01-02 DIAGNOSIS — I10 Essential (primary) hypertension: Secondary | ICD-10-CM | POA: Diagnosis not present

## 2020-01-02 DIAGNOSIS — J45909 Unspecified asthma, uncomplicated: Secondary | ICD-10-CM | POA: Diagnosis not present

## 2020-01-02 DIAGNOSIS — M1990 Unspecified osteoarthritis, unspecified site: Secondary | ICD-10-CM | POA: Diagnosis not present

## 2020-01-06 DIAGNOSIS — F419 Anxiety disorder, unspecified: Secondary | ICD-10-CM | POA: Diagnosis not present

## 2020-01-06 DIAGNOSIS — E039 Hypothyroidism, unspecified: Secondary | ICD-10-CM | POA: Diagnosis not present

## 2020-01-06 DIAGNOSIS — J45909 Unspecified asthma, uncomplicated: Secondary | ICD-10-CM | POA: Diagnosis not present

## 2020-01-06 DIAGNOSIS — S41112D Laceration without foreign body of left upper arm, subsequent encounter: Secondary | ICD-10-CM | POA: Diagnosis not present

## 2020-01-06 DIAGNOSIS — M1990 Unspecified osteoarthritis, unspecified site: Secondary | ICD-10-CM | POA: Diagnosis not present

## 2020-01-06 DIAGNOSIS — G43909 Migraine, unspecified, not intractable, without status migrainosus: Secondary | ICD-10-CM | POA: Diagnosis not present

## 2020-01-06 DIAGNOSIS — H409 Unspecified glaucoma: Secondary | ICD-10-CM | POA: Diagnosis not present

## 2020-01-06 DIAGNOSIS — E871 Hypo-osmolality and hyponatremia: Secondary | ICD-10-CM | POA: Diagnosis not present

## 2020-01-06 DIAGNOSIS — I10 Essential (primary) hypertension: Secondary | ICD-10-CM | POA: Diagnosis not present

## 2020-01-07 DIAGNOSIS — J45909 Unspecified asthma, uncomplicated: Secondary | ICD-10-CM | POA: Diagnosis not present

## 2020-01-07 DIAGNOSIS — I10 Essential (primary) hypertension: Secondary | ICD-10-CM | POA: Diagnosis not present

## 2020-01-07 DIAGNOSIS — F419 Anxiety disorder, unspecified: Secondary | ICD-10-CM | POA: Diagnosis not present

## 2020-01-07 DIAGNOSIS — E039 Hypothyroidism, unspecified: Secondary | ICD-10-CM | POA: Diagnosis not present

## 2020-01-07 DIAGNOSIS — G43909 Migraine, unspecified, not intractable, without status migrainosus: Secondary | ICD-10-CM | POA: Diagnosis not present

## 2020-01-07 DIAGNOSIS — H409 Unspecified glaucoma: Secondary | ICD-10-CM | POA: Diagnosis not present

## 2020-01-07 DIAGNOSIS — S41112D Laceration without foreign body of left upper arm, subsequent encounter: Secondary | ICD-10-CM | POA: Diagnosis not present

## 2020-01-07 DIAGNOSIS — E871 Hypo-osmolality and hyponatremia: Secondary | ICD-10-CM | POA: Diagnosis not present

## 2020-01-07 DIAGNOSIS — M1990 Unspecified osteoarthritis, unspecified site: Secondary | ICD-10-CM | POA: Diagnosis not present

## 2020-01-09 DIAGNOSIS — S41112D Laceration without foreign body of left upper arm, subsequent encounter: Secondary | ICD-10-CM | POA: Diagnosis not present

## 2020-01-09 DIAGNOSIS — E871 Hypo-osmolality and hyponatremia: Secondary | ICD-10-CM | POA: Diagnosis not present

## 2020-01-09 DIAGNOSIS — M1990 Unspecified osteoarthritis, unspecified site: Secondary | ICD-10-CM | POA: Diagnosis not present

## 2020-01-09 DIAGNOSIS — H409 Unspecified glaucoma: Secondary | ICD-10-CM | POA: Diagnosis not present

## 2020-01-09 DIAGNOSIS — G43909 Migraine, unspecified, not intractable, without status migrainosus: Secondary | ICD-10-CM | POA: Diagnosis not present

## 2020-01-09 DIAGNOSIS — I10 Essential (primary) hypertension: Secondary | ICD-10-CM | POA: Diagnosis not present

## 2020-01-09 DIAGNOSIS — F419 Anxiety disorder, unspecified: Secondary | ICD-10-CM | POA: Diagnosis not present

## 2020-01-09 DIAGNOSIS — E039 Hypothyroidism, unspecified: Secondary | ICD-10-CM | POA: Diagnosis not present

## 2020-01-09 DIAGNOSIS — J45909 Unspecified asthma, uncomplicated: Secondary | ICD-10-CM | POA: Diagnosis not present

## 2020-01-11 DIAGNOSIS — E871 Hypo-osmolality and hyponatremia: Secondary | ICD-10-CM | POA: Diagnosis not present

## 2020-01-11 DIAGNOSIS — H409 Unspecified glaucoma: Secondary | ICD-10-CM | POA: Diagnosis not present

## 2020-01-11 DIAGNOSIS — S41112D Laceration without foreign body of left upper arm, subsequent encounter: Secondary | ICD-10-CM | POA: Diagnosis not present

## 2020-01-11 DIAGNOSIS — F419 Anxiety disorder, unspecified: Secondary | ICD-10-CM | POA: Diagnosis not present

## 2020-01-11 DIAGNOSIS — I10 Essential (primary) hypertension: Secondary | ICD-10-CM | POA: Diagnosis not present

## 2020-01-11 DIAGNOSIS — E039 Hypothyroidism, unspecified: Secondary | ICD-10-CM | POA: Diagnosis not present

## 2020-01-11 DIAGNOSIS — G43909 Migraine, unspecified, not intractable, without status migrainosus: Secondary | ICD-10-CM | POA: Diagnosis not present

## 2020-01-11 DIAGNOSIS — M1990 Unspecified osteoarthritis, unspecified site: Secondary | ICD-10-CM | POA: Diagnosis not present

## 2020-01-11 DIAGNOSIS — J45909 Unspecified asthma, uncomplicated: Secondary | ICD-10-CM | POA: Diagnosis not present

## 2020-01-12 DIAGNOSIS — F419 Anxiety disorder, unspecified: Secondary | ICD-10-CM | POA: Diagnosis not present

## 2020-01-12 DIAGNOSIS — E871 Hypo-osmolality and hyponatremia: Secondary | ICD-10-CM | POA: Diagnosis not present

## 2020-01-12 DIAGNOSIS — G43909 Migraine, unspecified, not intractable, without status migrainosus: Secondary | ICD-10-CM | POA: Diagnosis not present

## 2020-01-12 DIAGNOSIS — M1990 Unspecified osteoarthritis, unspecified site: Secondary | ICD-10-CM | POA: Diagnosis not present

## 2020-01-12 DIAGNOSIS — M25532 Pain in left wrist: Secondary | ICD-10-CM | POA: Diagnosis not present

## 2020-01-12 DIAGNOSIS — H409 Unspecified glaucoma: Secondary | ICD-10-CM | POA: Diagnosis not present

## 2020-01-12 DIAGNOSIS — I10 Essential (primary) hypertension: Secondary | ICD-10-CM | POA: Diagnosis not present

## 2020-01-12 DIAGNOSIS — J45909 Unspecified asthma, uncomplicated: Secondary | ICD-10-CM | POA: Diagnosis not present

## 2020-01-12 DIAGNOSIS — E039 Hypothyroidism, unspecified: Secondary | ICD-10-CM | POA: Diagnosis not present

## 2020-01-12 DIAGNOSIS — N390 Urinary tract infection, site not specified: Secondary | ICD-10-CM | POA: Diagnosis not present

## 2020-01-12 DIAGNOSIS — S41112D Laceration without foreign body of left upper arm, subsequent encounter: Secondary | ICD-10-CM | POA: Diagnosis not present

## 2020-01-13 DIAGNOSIS — J45909 Unspecified asthma, uncomplicated: Secondary | ICD-10-CM | POA: Diagnosis not present

## 2020-01-13 DIAGNOSIS — H409 Unspecified glaucoma: Secondary | ICD-10-CM | POA: Diagnosis not present

## 2020-01-13 DIAGNOSIS — S41112D Laceration without foreign body of left upper arm, subsequent encounter: Secondary | ICD-10-CM | POA: Diagnosis not present

## 2020-01-13 DIAGNOSIS — G43909 Migraine, unspecified, not intractable, without status migrainosus: Secondary | ICD-10-CM | POA: Diagnosis not present

## 2020-01-13 DIAGNOSIS — I10 Essential (primary) hypertension: Secondary | ICD-10-CM | POA: Diagnosis not present

## 2020-01-13 DIAGNOSIS — F419 Anxiety disorder, unspecified: Secondary | ICD-10-CM | POA: Diagnosis not present

## 2020-01-13 DIAGNOSIS — M1990 Unspecified osteoarthritis, unspecified site: Secondary | ICD-10-CM | POA: Diagnosis not present

## 2020-01-13 DIAGNOSIS — E039 Hypothyroidism, unspecified: Secondary | ICD-10-CM | POA: Diagnosis not present

## 2020-01-13 DIAGNOSIS — E871 Hypo-osmolality and hyponatremia: Secondary | ICD-10-CM | POA: Diagnosis not present

## 2020-01-14 DIAGNOSIS — I10 Essential (primary) hypertension: Secondary | ICD-10-CM | POA: Diagnosis not present

## 2020-01-14 DIAGNOSIS — G43909 Migraine, unspecified, not intractable, without status migrainosus: Secondary | ICD-10-CM | POA: Diagnosis not present

## 2020-01-14 DIAGNOSIS — J45909 Unspecified asthma, uncomplicated: Secondary | ICD-10-CM | POA: Diagnosis not present

## 2020-01-14 DIAGNOSIS — H409 Unspecified glaucoma: Secondary | ICD-10-CM | POA: Diagnosis not present

## 2020-01-14 DIAGNOSIS — S41112D Laceration without foreign body of left upper arm, subsequent encounter: Secondary | ICD-10-CM | POA: Diagnosis not present

## 2020-01-14 DIAGNOSIS — F419 Anxiety disorder, unspecified: Secondary | ICD-10-CM | POA: Diagnosis not present

## 2020-01-14 DIAGNOSIS — E871 Hypo-osmolality and hyponatremia: Secondary | ICD-10-CM | POA: Diagnosis not present

## 2020-01-14 DIAGNOSIS — E039 Hypothyroidism, unspecified: Secondary | ICD-10-CM | POA: Diagnosis not present

## 2020-01-14 DIAGNOSIS — M1990 Unspecified osteoarthritis, unspecified site: Secondary | ICD-10-CM | POA: Diagnosis not present

## 2020-01-15 DIAGNOSIS — M1990 Unspecified osteoarthritis, unspecified site: Secondary | ICD-10-CM | POA: Diagnosis not present

## 2020-01-15 DIAGNOSIS — E039 Hypothyroidism, unspecified: Secondary | ICD-10-CM | POA: Diagnosis not present

## 2020-01-15 DIAGNOSIS — J45909 Unspecified asthma, uncomplicated: Secondary | ICD-10-CM | POA: Diagnosis not present

## 2020-01-15 DIAGNOSIS — F419 Anxiety disorder, unspecified: Secondary | ICD-10-CM | POA: Diagnosis not present

## 2020-01-15 DIAGNOSIS — G43909 Migraine, unspecified, not intractable, without status migrainosus: Secondary | ICD-10-CM | POA: Diagnosis not present

## 2020-01-15 DIAGNOSIS — E871 Hypo-osmolality and hyponatremia: Secondary | ICD-10-CM | POA: Diagnosis not present

## 2020-01-15 DIAGNOSIS — I10 Essential (primary) hypertension: Secondary | ICD-10-CM | POA: Diagnosis not present

## 2020-01-15 DIAGNOSIS — S41112D Laceration without foreign body of left upper arm, subsequent encounter: Secondary | ICD-10-CM | POA: Diagnosis not present

## 2020-01-15 DIAGNOSIS — H409 Unspecified glaucoma: Secondary | ICD-10-CM | POA: Diagnosis not present

## 2020-01-21 DIAGNOSIS — H409 Unspecified glaucoma: Secondary | ICD-10-CM | POA: Diagnosis not present

## 2020-01-21 DIAGNOSIS — M1990 Unspecified osteoarthritis, unspecified site: Secondary | ICD-10-CM | POA: Diagnosis not present

## 2020-01-21 DIAGNOSIS — S41112D Laceration without foreign body of left upper arm, subsequent encounter: Secondary | ICD-10-CM | POA: Diagnosis not present

## 2020-01-21 DIAGNOSIS — I10 Essential (primary) hypertension: Secondary | ICD-10-CM | POA: Diagnosis not present

## 2020-01-21 DIAGNOSIS — E039 Hypothyroidism, unspecified: Secondary | ICD-10-CM | POA: Diagnosis not present

## 2020-01-21 DIAGNOSIS — E871 Hypo-osmolality and hyponatremia: Secondary | ICD-10-CM | POA: Diagnosis not present

## 2020-01-21 DIAGNOSIS — J45909 Unspecified asthma, uncomplicated: Secondary | ICD-10-CM | POA: Diagnosis not present

## 2020-01-21 DIAGNOSIS — F419 Anxiety disorder, unspecified: Secondary | ICD-10-CM | POA: Diagnosis not present

## 2020-01-21 DIAGNOSIS — G43909 Migraine, unspecified, not intractable, without status migrainosus: Secondary | ICD-10-CM | POA: Diagnosis not present

## 2020-01-26 DIAGNOSIS — R05 Cough: Secondary | ICD-10-CM | POA: Diagnosis not present

## 2020-01-26 DIAGNOSIS — J40 Bronchitis, not specified as acute or chronic: Secondary | ICD-10-CM | POA: Diagnosis not present

## 2020-01-27 DIAGNOSIS — M1990 Unspecified osteoarthritis, unspecified site: Secondary | ICD-10-CM | POA: Diagnosis not present

## 2020-01-27 DIAGNOSIS — E871 Hypo-osmolality and hyponatremia: Secondary | ICD-10-CM | POA: Diagnosis not present

## 2020-01-27 DIAGNOSIS — E039 Hypothyroidism, unspecified: Secondary | ICD-10-CM | POA: Diagnosis not present

## 2020-01-27 DIAGNOSIS — G43909 Migraine, unspecified, not intractable, without status migrainosus: Secondary | ICD-10-CM | POA: Diagnosis not present

## 2020-01-27 DIAGNOSIS — F419 Anxiety disorder, unspecified: Secondary | ICD-10-CM | POA: Diagnosis not present

## 2020-01-27 DIAGNOSIS — I10 Essential (primary) hypertension: Secondary | ICD-10-CM | POA: Diagnosis not present

## 2020-01-27 DIAGNOSIS — H409 Unspecified glaucoma: Secondary | ICD-10-CM | POA: Diagnosis not present

## 2020-01-27 DIAGNOSIS — J45909 Unspecified asthma, uncomplicated: Secondary | ICD-10-CM | POA: Diagnosis not present

## 2020-01-27 DIAGNOSIS — S41112D Laceration without foreign body of left upper arm, subsequent encounter: Secondary | ICD-10-CM | POA: Diagnosis not present

## 2020-01-29 DIAGNOSIS — S41112D Laceration without foreign body of left upper arm, subsequent encounter: Secondary | ICD-10-CM | POA: Diagnosis not present

## 2020-01-29 DIAGNOSIS — I10 Essential (primary) hypertension: Secondary | ICD-10-CM | POA: Diagnosis not present

## 2020-01-29 DIAGNOSIS — M1990 Unspecified osteoarthritis, unspecified site: Secondary | ICD-10-CM | POA: Diagnosis not present

## 2020-01-29 DIAGNOSIS — H409 Unspecified glaucoma: Secondary | ICD-10-CM | POA: Diagnosis not present

## 2020-01-29 DIAGNOSIS — E871 Hypo-osmolality and hyponatremia: Secondary | ICD-10-CM | POA: Diagnosis not present

## 2020-01-29 DIAGNOSIS — F419 Anxiety disorder, unspecified: Secondary | ICD-10-CM | POA: Diagnosis not present

## 2020-01-29 DIAGNOSIS — J45909 Unspecified asthma, uncomplicated: Secondary | ICD-10-CM | POA: Diagnosis not present

## 2020-01-29 DIAGNOSIS — E039 Hypothyroidism, unspecified: Secondary | ICD-10-CM | POA: Diagnosis not present

## 2020-01-29 DIAGNOSIS — G43909 Migraine, unspecified, not intractable, without status migrainosus: Secondary | ICD-10-CM | POA: Diagnosis not present

## 2020-02-01 DIAGNOSIS — E871 Hypo-osmolality and hyponatremia: Secondary | ICD-10-CM | POA: Diagnosis not present

## 2020-02-01 DIAGNOSIS — M1990 Unspecified osteoarthritis, unspecified site: Secondary | ICD-10-CM | POA: Diagnosis not present

## 2020-02-01 DIAGNOSIS — H409 Unspecified glaucoma: Secondary | ICD-10-CM | POA: Diagnosis not present

## 2020-02-01 DIAGNOSIS — J45909 Unspecified asthma, uncomplicated: Secondary | ICD-10-CM | POA: Diagnosis not present

## 2020-02-01 DIAGNOSIS — S41112D Laceration without foreign body of left upper arm, subsequent encounter: Secondary | ICD-10-CM | POA: Diagnosis not present

## 2020-02-01 DIAGNOSIS — G43909 Migraine, unspecified, not intractable, without status migrainosus: Secondary | ICD-10-CM | POA: Diagnosis not present

## 2020-02-01 DIAGNOSIS — I10 Essential (primary) hypertension: Secondary | ICD-10-CM | POA: Diagnosis not present

## 2020-02-01 DIAGNOSIS — E039 Hypothyroidism, unspecified: Secondary | ICD-10-CM | POA: Diagnosis not present

## 2020-02-01 DIAGNOSIS — F419 Anxiety disorder, unspecified: Secondary | ICD-10-CM | POA: Diagnosis not present

## 2020-02-03 DIAGNOSIS — M1990 Unspecified osteoarthritis, unspecified site: Secondary | ICD-10-CM | POA: Diagnosis not present

## 2020-02-03 DIAGNOSIS — S41112D Laceration without foreign body of left upper arm, subsequent encounter: Secondary | ICD-10-CM | POA: Diagnosis not present

## 2020-02-03 DIAGNOSIS — J45909 Unspecified asthma, uncomplicated: Secondary | ICD-10-CM | POA: Diagnosis not present

## 2020-02-03 DIAGNOSIS — E871 Hypo-osmolality and hyponatremia: Secondary | ICD-10-CM | POA: Diagnosis not present

## 2020-02-03 DIAGNOSIS — H409 Unspecified glaucoma: Secondary | ICD-10-CM | POA: Diagnosis not present

## 2020-02-03 DIAGNOSIS — F419 Anxiety disorder, unspecified: Secondary | ICD-10-CM | POA: Diagnosis not present

## 2020-02-03 DIAGNOSIS — I10 Essential (primary) hypertension: Secondary | ICD-10-CM | POA: Diagnosis not present

## 2020-02-03 DIAGNOSIS — G43909 Migraine, unspecified, not intractable, without status migrainosus: Secondary | ICD-10-CM | POA: Diagnosis not present

## 2020-02-03 DIAGNOSIS — E039 Hypothyroidism, unspecified: Secondary | ICD-10-CM | POA: Diagnosis not present

## 2020-02-04 DIAGNOSIS — E039 Hypothyroidism, unspecified: Secondary | ICD-10-CM | POA: Diagnosis not present

## 2020-02-04 DIAGNOSIS — I10 Essential (primary) hypertension: Secondary | ICD-10-CM | POA: Diagnosis not present

## 2020-02-04 DIAGNOSIS — F419 Anxiety disorder, unspecified: Secondary | ICD-10-CM | POA: Diagnosis not present

## 2020-02-04 DIAGNOSIS — H409 Unspecified glaucoma: Secondary | ICD-10-CM | POA: Diagnosis not present

## 2020-02-04 DIAGNOSIS — M1990 Unspecified osteoarthritis, unspecified site: Secondary | ICD-10-CM | POA: Diagnosis not present

## 2020-02-04 DIAGNOSIS — G43909 Migraine, unspecified, not intractable, without status migrainosus: Secondary | ICD-10-CM | POA: Diagnosis not present

## 2020-02-04 DIAGNOSIS — E871 Hypo-osmolality and hyponatremia: Secondary | ICD-10-CM | POA: Diagnosis not present

## 2020-02-04 DIAGNOSIS — J45909 Unspecified asthma, uncomplicated: Secondary | ICD-10-CM | POA: Diagnosis not present

## 2020-02-04 DIAGNOSIS — S41112D Laceration without foreign body of left upper arm, subsequent encounter: Secondary | ICD-10-CM | POA: Diagnosis not present

## 2020-02-11 DIAGNOSIS — F419 Anxiety disorder, unspecified: Secondary | ICD-10-CM | POA: Diagnosis not present

## 2020-02-11 DIAGNOSIS — E871 Hypo-osmolality and hyponatremia: Secondary | ICD-10-CM | POA: Diagnosis not present

## 2020-02-11 DIAGNOSIS — J45909 Unspecified asthma, uncomplicated: Secondary | ICD-10-CM | POA: Diagnosis not present

## 2020-02-11 DIAGNOSIS — I10 Essential (primary) hypertension: Secondary | ICD-10-CM | POA: Diagnosis not present

## 2020-02-11 DIAGNOSIS — E039 Hypothyroidism, unspecified: Secondary | ICD-10-CM | POA: Diagnosis not present

## 2020-02-11 DIAGNOSIS — M1990 Unspecified osteoarthritis, unspecified site: Secondary | ICD-10-CM | POA: Diagnosis not present

## 2020-02-11 DIAGNOSIS — S41112D Laceration without foreign body of left upper arm, subsequent encounter: Secondary | ICD-10-CM | POA: Diagnosis not present

## 2020-02-11 DIAGNOSIS — G43909 Migraine, unspecified, not intractable, without status migrainosus: Secondary | ICD-10-CM | POA: Diagnosis not present

## 2020-02-11 DIAGNOSIS — H409 Unspecified glaucoma: Secondary | ICD-10-CM | POA: Diagnosis not present

## 2020-02-12 DIAGNOSIS — H409 Unspecified glaucoma: Secondary | ICD-10-CM | POA: Diagnosis not present

## 2020-02-12 DIAGNOSIS — I10 Essential (primary) hypertension: Secondary | ICD-10-CM | POA: Diagnosis not present

## 2020-02-12 DIAGNOSIS — J45909 Unspecified asthma, uncomplicated: Secondary | ICD-10-CM | POA: Diagnosis not present

## 2020-02-12 DIAGNOSIS — G43909 Migraine, unspecified, not intractable, without status migrainosus: Secondary | ICD-10-CM | POA: Diagnosis not present

## 2020-02-12 DIAGNOSIS — E039 Hypothyroidism, unspecified: Secondary | ICD-10-CM | POA: Diagnosis not present

## 2020-02-12 DIAGNOSIS — M1990 Unspecified osteoarthritis, unspecified site: Secondary | ICD-10-CM | POA: Diagnosis not present

## 2020-02-12 DIAGNOSIS — E871 Hypo-osmolality and hyponatremia: Secondary | ICD-10-CM | POA: Diagnosis not present

## 2020-02-12 DIAGNOSIS — S41112D Laceration without foreign body of left upper arm, subsequent encounter: Secondary | ICD-10-CM | POA: Diagnosis not present

## 2020-02-12 DIAGNOSIS — F419 Anxiety disorder, unspecified: Secondary | ICD-10-CM | POA: Diagnosis not present

## 2020-02-16 DIAGNOSIS — H409 Unspecified glaucoma: Secondary | ICD-10-CM | POA: Diagnosis not present

## 2020-02-16 DIAGNOSIS — E039 Hypothyroidism, unspecified: Secondary | ICD-10-CM | POA: Diagnosis not present

## 2020-02-16 DIAGNOSIS — M1990 Unspecified osteoarthritis, unspecified site: Secondary | ICD-10-CM | POA: Diagnosis not present

## 2020-02-16 DIAGNOSIS — E871 Hypo-osmolality and hyponatremia: Secondary | ICD-10-CM | POA: Diagnosis not present

## 2020-02-16 DIAGNOSIS — S41112D Laceration without foreign body of left upper arm, subsequent encounter: Secondary | ICD-10-CM | POA: Diagnosis not present

## 2020-02-16 DIAGNOSIS — J45909 Unspecified asthma, uncomplicated: Secondary | ICD-10-CM | POA: Diagnosis not present

## 2020-02-16 DIAGNOSIS — F419 Anxiety disorder, unspecified: Secondary | ICD-10-CM | POA: Diagnosis not present

## 2020-02-16 DIAGNOSIS — I10 Essential (primary) hypertension: Secondary | ICD-10-CM | POA: Diagnosis not present

## 2020-02-16 DIAGNOSIS — G43909 Migraine, unspecified, not intractable, without status migrainosus: Secondary | ICD-10-CM | POA: Diagnosis not present

## 2020-02-17 DIAGNOSIS — G8929 Other chronic pain: Secondary | ICD-10-CM | POA: Diagnosis not present

## 2020-02-17 DIAGNOSIS — M545 Low back pain, unspecified: Secondary | ICD-10-CM | POA: Diagnosis not present

## 2020-02-26 ENCOUNTER — Ambulatory Visit: Admission: RE | Admit: 2020-02-26 | Payer: Medicare HMO | Source: Home / Self Care | Admitting: Ophthalmology

## 2020-02-26 ENCOUNTER — Encounter: Admission: RE | Payer: Self-pay | Source: Home / Self Care

## 2020-02-26 DIAGNOSIS — E871 Hypo-osmolality and hyponatremia: Secondary | ICD-10-CM | POA: Diagnosis not present

## 2020-02-26 DIAGNOSIS — S41112D Laceration without foreign body of left upper arm, subsequent encounter: Secondary | ICD-10-CM | POA: Diagnosis not present

## 2020-02-26 DIAGNOSIS — H409 Unspecified glaucoma: Secondary | ICD-10-CM | POA: Diagnosis not present

## 2020-02-26 DIAGNOSIS — E039 Hypothyroidism, unspecified: Secondary | ICD-10-CM | POA: Diagnosis not present

## 2020-02-26 DIAGNOSIS — M1990 Unspecified osteoarthritis, unspecified site: Secondary | ICD-10-CM | POA: Diagnosis not present

## 2020-02-26 DIAGNOSIS — G43909 Migraine, unspecified, not intractable, without status migrainosus: Secondary | ICD-10-CM | POA: Diagnosis not present

## 2020-02-26 DIAGNOSIS — J45909 Unspecified asthma, uncomplicated: Secondary | ICD-10-CM | POA: Diagnosis not present

## 2020-02-26 DIAGNOSIS — F419 Anxiety disorder, unspecified: Secondary | ICD-10-CM | POA: Diagnosis not present

## 2020-02-26 DIAGNOSIS — I10 Essential (primary) hypertension: Secondary | ICD-10-CM | POA: Diagnosis not present

## 2020-02-26 SURGERY — PHACOEMULSIFICATION, CATARACT, WITH IOL INSERTION
Anesthesia: Topical | Laterality: Right

## 2020-02-27 DIAGNOSIS — R42 Dizziness and giddiness: Secondary | ICD-10-CM | POA: Diagnosis not present

## 2020-03-02 DIAGNOSIS — S41112D Laceration without foreign body of left upper arm, subsequent encounter: Secondary | ICD-10-CM | POA: Diagnosis not present

## 2020-03-02 DIAGNOSIS — E039 Hypothyroidism, unspecified: Secondary | ICD-10-CM | POA: Diagnosis not present

## 2020-03-02 DIAGNOSIS — M1990 Unspecified osteoarthritis, unspecified site: Secondary | ICD-10-CM | POA: Diagnosis not present

## 2020-03-02 DIAGNOSIS — I10 Essential (primary) hypertension: Secondary | ICD-10-CM | POA: Diagnosis not present

## 2020-03-02 DIAGNOSIS — J45909 Unspecified asthma, uncomplicated: Secondary | ICD-10-CM | POA: Diagnosis not present

## 2020-03-02 DIAGNOSIS — G43909 Migraine, unspecified, not intractable, without status migrainosus: Secondary | ICD-10-CM | POA: Diagnosis not present

## 2020-03-02 DIAGNOSIS — F419 Anxiety disorder, unspecified: Secondary | ICD-10-CM | POA: Diagnosis not present

## 2020-03-02 DIAGNOSIS — E871 Hypo-osmolality and hyponatremia: Secondary | ICD-10-CM | POA: Diagnosis not present

## 2020-03-02 DIAGNOSIS — H409 Unspecified glaucoma: Secondary | ICD-10-CM | POA: Diagnosis not present

## 2020-03-05 DIAGNOSIS — G43909 Migraine, unspecified, not intractable, without status migrainosus: Secondary | ICD-10-CM | POA: Diagnosis not present

## 2020-03-05 DIAGNOSIS — E871 Hypo-osmolality and hyponatremia: Secondary | ICD-10-CM | POA: Diagnosis not present

## 2020-03-05 DIAGNOSIS — H409 Unspecified glaucoma: Secondary | ICD-10-CM | POA: Diagnosis not present

## 2020-03-05 DIAGNOSIS — J45909 Unspecified asthma, uncomplicated: Secondary | ICD-10-CM | POA: Diagnosis not present

## 2020-03-05 DIAGNOSIS — I10 Essential (primary) hypertension: Secondary | ICD-10-CM | POA: Diagnosis not present

## 2020-03-05 DIAGNOSIS — E039 Hypothyroidism, unspecified: Secondary | ICD-10-CM | POA: Diagnosis not present

## 2020-03-05 DIAGNOSIS — S41112D Laceration without foreign body of left upper arm, subsequent encounter: Secondary | ICD-10-CM | POA: Diagnosis not present

## 2020-03-05 DIAGNOSIS — M1990 Unspecified osteoarthritis, unspecified site: Secondary | ICD-10-CM | POA: Diagnosis not present

## 2020-03-05 DIAGNOSIS — F419 Anxiety disorder, unspecified: Secondary | ICD-10-CM | POA: Diagnosis not present

## 2020-03-09 DIAGNOSIS — M1990 Unspecified osteoarthritis, unspecified site: Secondary | ICD-10-CM | POA: Diagnosis not present

## 2020-03-09 DIAGNOSIS — F419 Anxiety disorder, unspecified: Secondary | ICD-10-CM | POA: Diagnosis not present

## 2020-03-09 DIAGNOSIS — Z23 Encounter for immunization: Secondary | ICD-10-CM | POA: Diagnosis not present

## 2020-03-09 DIAGNOSIS — J45909 Unspecified asthma, uncomplicated: Secondary | ICD-10-CM | POA: Diagnosis not present

## 2020-03-09 DIAGNOSIS — I10 Essential (primary) hypertension: Secondary | ICD-10-CM | POA: Diagnosis not present

## 2020-03-09 DIAGNOSIS — E871 Hypo-osmolality and hyponatremia: Secondary | ICD-10-CM | POA: Diagnosis not present

## 2020-03-09 DIAGNOSIS — G43909 Migraine, unspecified, not intractable, without status migrainosus: Secondary | ICD-10-CM | POA: Diagnosis not present

## 2020-03-09 DIAGNOSIS — S41112D Laceration without foreign body of left upper arm, subsequent encounter: Secondary | ICD-10-CM | POA: Diagnosis not present

## 2020-03-09 DIAGNOSIS — H8110 Benign paroxysmal vertigo, unspecified ear: Secondary | ICD-10-CM | POA: Diagnosis not present

## 2020-03-09 DIAGNOSIS — H409 Unspecified glaucoma: Secondary | ICD-10-CM | POA: Diagnosis not present

## 2020-03-09 DIAGNOSIS — E039 Hypothyroidism, unspecified: Secondary | ICD-10-CM | POA: Diagnosis not present

## 2020-03-11 DIAGNOSIS — F419 Anxiety disorder, unspecified: Secondary | ICD-10-CM | POA: Diagnosis not present

## 2020-03-11 DIAGNOSIS — J45909 Unspecified asthma, uncomplicated: Secondary | ICD-10-CM | POA: Diagnosis not present

## 2020-03-11 DIAGNOSIS — S41112D Laceration without foreign body of left upper arm, subsequent encounter: Secondary | ICD-10-CM | POA: Diagnosis not present

## 2020-03-11 DIAGNOSIS — E871 Hypo-osmolality and hyponatremia: Secondary | ICD-10-CM | POA: Diagnosis not present

## 2020-03-11 DIAGNOSIS — E039 Hypothyroidism, unspecified: Secondary | ICD-10-CM | POA: Diagnosis not present

## 2020-03-11 DIAGNOSIS — I10 Essential (primary) hypertension: Secondary | ICD-10-CM | POA: Diagnosis not present

## 2020-03-11 DIAGNOSIS — H409 Unspecified glaucoma: Secondary | ICD-10-CM | POA: Diagnosis not present

## 2020-03-11 DIAGNOSIS — G43909 Migraine, unspecified, not intractable, without status migrainosus: Secondary | ICD-10-CM | POA: Diagnosis not present

## 2020-03-11 DIAGNOSIS — M1990 Unspecified osteoarthritis, unspecified site: Secondary | ICD-10-CM | POA: Diagnosis not present

## 2020-03-16 DIAGNOSIS — F419 Anxiety disorder, unspecified: Secondary | ICD-10-CM | POA: Diagnosis not present

## 2020-03-16 DIAGNOSIS — E871 Hypo-osmolality and hyponatremia: Secondary | ICD-10-CM | POA: Diagnosis not present

## 2020-03-16 DIAGNOSIS — J45909 Unspecified asthma, uncomplicated: Secondary | ICD-10-CM | POA: Diagnosis not present

## 2020-03-16 DIAGNOSIS — H409 Unspecified glaucoma: Secondary | ICD-10-CM | POA: Diagnosis not present

## 2020-03-16 DIAGNOSIS — M1990 Unspecified osteoarthritis, unspecified site: Secondary | ICD-10-CM | POA: Diagnosis not present

## 2020-03-16 DIAGNOSIS — G43909 Migraine, unspecified, not intractable, without status migrainosus: Secondary | ICD-10-CM | POA: Diagnosis not present

## 2020-03-16 DIAGNOSIS — I10 Essential (primary) hypertension: Secondary | ICD-10-CM | POA: Diagnosis not present

## 2020-03-16 DIAGNOSIS — E039 Hypothyroidism, unspecified: Secondary | ICD-10-CM | POA: Diagnosis not present

## 2020-03-16 DIAGNOSIS — S41112D Laceration without foreign body of left upper arm, subsequent encounter: Secondary | ICD-10-CM | POA: Diagnosis not present

## 2020-03-18 DIAGNOSIS — J4540 Moderate persistent asthma, uncomplicated: Secondary | ICD-10-CM | POA: Diagnosis not present

## 2020-03-22 DIAGNOSIS — J45909 Unspecified asthma, uncomplicated: Secondary | ICD-10-CM | POA: Diagnosis not present

## 2020-03-22 DIAGNOSIS — E039 Hypothyroidism, unspecified: Secondary | ICD-10-CM | POA: Diagnosis not present

## 2020-03-22 DIAGNOSIS — H409 Unspecified glaucoma: Secondary | ICD-10-CM | POA: Diagnosis not present

## 2020-03-22 DIAGNOSIS — G43909 Migraine, unspecified, not intractable, without status migrainosus: Secondary | ICD-10-CM | POA: Diagnosis not present

## 2020-03-22 DIAGNOSIS — E871 Hypo-osmolality and hyponatremia: Secondary | ICD-10-CM | POA: Diagnosis not present

## 2020-03-22 DIAGNOSIS — M1990 Unspecified osteoarthritis, unspecified site: Secondary | ICD-10-CM | POA: Diagnosis not present

## 2020-03-22 DIAGNOSIS — I10 Essential (primary) hypertension: Secondary | ICD-10-CM | POA: Diagnosis not present

## 2020-03-22 DIAGNOSIS — S41112D Laceration without foreign body of left upper arm, subsequent encounter: Secondary | ICD-10-CM | POA: Diagnosis not present

## 2020-03-22 DIAGNOSIS — F419 Anxiety disorder, unspecified: Secondary | ICD-10-CM | POA: Diagnosis not present

## 2020-03-24 DIAGNOSIS — M1990 Unspecified osteoarthritis, unspecified site: Secondary | ICD-10-CM | POA: Diagnosis not present

## 2020-03-24 DIAGNOSIS — F419 Anxiety disorder, unspecified: Secondary | ICD-10-CM | POA: Diagnosis not present

## 2020-03-24 DIAGNOSIS — I10 Essential (primary) hypertension: Secondary | ICD-10-CM | POA: Diagnosis not present

## 2020-03-24 DIAGNOSIS — S41112D Laceration without foreign body of left upper arm, subsequent encounter: Secondary | ICD-10-CM | POA: Diagnosis not present

## 2020-03-24 DIAGNOSIS — E871 Hypo-osmolality and hyponatremia: Secondary | ICD-10-CM | POA: Diagnosis not present

## 2020-03-24 DIAGNOSIS — G43909 Migraine, unspecified, not intractable, without status migrainosus: Secondary | ICD-10-CM | POA: Diagnosis not present

## 2020-03-24 DIAGNOSIS — H409 Unspecified glaucoma: Secondary | ICD-10-CM | POA: Diagnosis not present

## 2020-03-24 DIAGNOSIS — E039 Hypothyroidism, unspecified: Secondary | ICD-10-CM | POA: Diagnosis not present

## 2020-03-24 DIAGNOSIS — J45909 Unspecified asthma, uncomplicated: Secondary | ICD-10-CM | POA: Diagnosis not present

## 2020-03-31 DIAGNOSIS — H409 Unspecified glaucoma: Secondary | ICD-10-CM | POA: Diagnosis not present

## 2020-03-31 DIAGNOSIS — M1990 Unspecified osteoarthritis, unspecified site: Secondary | ICD-10-CM | POA: Diagnosis not present

## 2020-03-31 DIAGNOSIS — F419 Anxiety disorder, unspecified: Secondary | ICD-10-CM | POA: Diagnosis not present

## 2020-03-31 DIAGNOSIS — I10 Essential (primary) hypertension: Secondary | ICD-10-CM | POA: Diagnosis not present

## 2020-03-31 DIAGNOSIS — E039 Hypothyroidism, unspecified: Secondary | ICD-10-CM | POA: Diagnosis not present

## 2020-03-31 DIAGNOSIS — S41112D Laceration without foreign body of left upper arm, subsequent encounter: Secondary | ICD-10-CM | POA: Diagnosis not present

## 2020-03-31 DIAGNOSIS — E871 Hypo-osmolality and hyponatremia: Secondary | ICD-10-CM | POA: Diagnosis not present

## 2020-03-31 DIAGNOSIS — G43909 Migraine, unspecified, not intractable, without status migrainosus: Secondary | ICD-10-CM | POA: Diagnosis not present

## 2020-03-31 DIAGNOSIS — J45909 Unspecified asthma, uncomplicated: Secondary | ICD-10-CM | POA: Diagnosis not present

## 2020-04-06 DIAGNOSIS — E7800 Pure hypercholesterolemia, unspecified: Secondary | ICD-10-CM | POA: Diagnosis not present

## 2020-04-06 DIAGNOSIS — E039 Hypothyroidism, unspecified: Secondary | ICD-10-CM | POA: Diagnosis not present

## 2020-04-06 DIAGNOSIS — N1831 Chronic kidney disease, stage 3a: Secondary | ICD-10-CM | POA: Diagnosis not present

## 2020-04-06 DIAGNOSIS — Z79899 Other long term (current) drug therapy: Secondary | ICD-10-CM | POA: Diagnosis not present

## 2020-04-13 DIAGNOSIS — Z1331 Encounter for screening for depression: Secondary | ICD-10-CM | POA: Diagnosis not present

## 2020-04-13 DIAGNOSIS — E785 Hyperlipidemia, unspecified: Secondary | ICD-10-CM | POA: Diagnosis not present

## 2020-04-13 DIAGNOSIS — I129 Hypertensive chronic kidney disease with stage 1 through stage 4 chronic kidney disease, or unspecified chronic kidney disease: Secondary | ICD-10-CM | POA: Diagnosis not present

## 2020-04-13 DIAGNOSIS — E876 Hypokalemia: Secondary | ICD-10-CM | POA: Diagnosis not present

## 2020-04-13 DIAGNOSIS — E039 Hypothyroidism, unspecified: Secondary | ICD-10-CM | POA: Diagnosis not present

## 2020-04-13 DIAGNOSIS — I10 Essential (primary) hypertension: Secondary | ICD-10-CM | POA: Diagnosis not present

## 2020-04-13 DIAGNOSIS — Z0000 Encounter for general adult medical examination without abnormal findings: Secondary | ICD-10-CM | POA: Diagnosis not present

## 2020-04-13 DIAGNOSIS — N1831 Chronic kidney disease, stage 3a: Secondary | ICD-10-CM | POA: Diagnosis not present

## 2020-04-23 DIAGNOSIS — H8111 Benign paroxysmal vertigo, right ear: Secondary | ICD-10-CM | POA: Diagnosis not present

## 2020-04-26 DIAGNOSIS — M5136 Other intervertebral disc degeneration, lumbar region: Secondary | ICD-10-CM | POA: Diagnosis not present

## 2020-04-26 DIAGNOSIS — M48062 Spinal stenosis, lumbar region with neurogenic claudication: Secondary | ICD-10-CM | POA: Diagnosis not present

## 2020-04-26 DIAGNOSIS — M5416 Radiculopathy, lumbar region: Secondary | ICD-10-CM | POA: Diagnosis not present

## 2020-05-03 DIAGNOSIS — H8111 Benign paroxysmal vertigo, right ear: Secondary | ICD-10-CM | POA: Diagnosis not present

## 2020-05-20 DIAGNOSIS — M7061 Trochanteric bursitis, right hip: Secondary | ICD-10-CM | POA: Diagnosis not present

## 2020-05-25 DIAGNOSIS — M5136 Other intervertebral disc degeneration, lumbar region: Secondary | ICD-10-CM | POA: Diagnosis not present

## 2020-05-25 DIAGNOSIS — M48062 Spinal stenosis, lumbar region with neurogenic claudication: Secondary | ICD-10-CM | POA: Diagnosis not present

## 2020-05-25 DIAGNOSIS — M5416 Radiculopathy, lumbar region: Secondary | ICD-10-CM | POA: Diagnosis not present

## 2020-05-27 DIAGNOSIS — E119 Type 2 diabetes mellitus without complications: Secondary | ICD-10-CM | POA: Diagnosis not present

## 2020-05-27 DIAGNOSIS — Z0100 Encounter for examination of eyes and vision without abnormal findings: Secondary | ICD-10-CM | POA: Diagnosis not present

## 2020-06-07 DIAGNOSIS — M25551 Pain in right hip: Secondary | ICD-10-CM | POA: Diagnosis not present

## 2020-06-07 DIAGNOSIS — M545 Low back pain, unspecified: Secondary | ICD-10-CM | POA: Diagnosis not present

## 2020-06-22 DIAGNOSIS — M5136 Other intervertebral disc degeneration, lumbar region: Secondary | ICD-10-CM | POA: Diagnosis not present

## 2020-06-22 DIAGNOSIS — M48062 Spinal stenosis, lumbar region with neurogenic claudication: Secondary | ICD-10-CM | POA: Diagnosis not present

## 2020-06-22 DIAGNOSIS — M5416 Radiculopathy, lumbar region: Secondary | ICD-10-CM | POA: Diagnosis not present

## 2020-06-23 DIAGNOSIS — Z0100 Encounter for examination of eyes and vision without abnormal findings: Secondary | ICD-10-CM | POA: Diagnosis not present

## 2020-07-13 DIAGNOSIS — J4541 Moderate persistent asthma with (acute) exacerbation: Secondary | ICD-10-CM | POA: Diagnosis not present

## 2020-07-19 DIAGNOSIS — M5136 Other intervertebral disc degeneration, lumbar region: Secondary | ICD-10-CM | POA: Diagnosis not present

## 2020-07-19 DIAGNOSIS — M5416 Radiculopathy, lumbar region: Secondary | ICD-10-CM | POA: Diagnosis not present

## 2020-07-19 DIAGNOSIS — M47816 Spondylosis without myelopathy or radiculopathy, lumbar region: Secondary | ICD-10-CM | POA: Diagnosis not present

## 2020-07-26 DIAGNOSIS — M542 Cervicalgia: Secondary | ICD-10-CM | POA: Diagnosis not present

## 2020-08-13 DIAGNOSIS — H8111 Benign paroxysmal vertigo, right ear: Secondary | ICD-10-CM | POA: Diagnosis not present

## 2020-08-24 DIAGNOSIS — Z6831 Body mass index (BMI) 31.0-31.9, adult: Secondary | ICD-10-CM | POA: Diagnosis not present

## 2020-08-24 DIAGNOSIS — M545 Low back pain, unspecified: Secondary | ICD-10-CM | POA: Diagnosis not present

## 2020-08-24 DIAGNOSIS — I10 Essential (primary) hypertension: Secondary | ICD-10-CM | POA: Diagnosis not present

## 2020-08-25 DIAGNOSIS — J40 Bronchitis, not specified as acute or chronic: Secondary | ICD-10-CM | POA: Diagnosis not present

## 2020-08-25 DIAGNOSIS — R059 Cough, unspecified: Secondary | ICD-10-CM | POA: Diagnosis not present

## 2020-09-17 ENCOUNTER — Other Ambulatory Visit: Payer: Self-pay | Admitting: Otolaryngology

## 2020-09-17 DIAGNOSIS — J301 Allergic rhinitis due to pollen: Secondary | ICD-10-CM | POA: Diagnosis not present

## 2020-09-17 DIAGNOSIS — H8111 Benign paroxysmal vertigo, right ear: Secondary | ICD-10-CM | POA: Diagnosis not present

## 2020-09-17 DIAGNOSIS — R42 Dizziness and giddiness: Secondary | ICD-10-CM | POA: Diagnosis not present

## 2020-09-21 DIAGNOSIS — J4540 Moderate persistent asthma, uncomplicated: Secondary | ICD-10-CM | POA: Diagnosis not present

## 2020-09-21 DIAGNOSIS — Z03818 Encounter for observation for suspected exposure to other biological agents ruled out: Secondary | ICD-10-CM | POA: Diagnosis not present

## 2020-09-21 DIAGNOSIS — Z01818 Encounter for other preprocedural examination: Secondary | ICD-10-CM | POA: Diagnosis not present

## 2020-10-04 DIAGNOSIS — N183 Chronic kidney disease, stage 3 unspecified: Secondary | ICD-10-CM | POA: Diagnosis not present

## 2020-10-04 DIAGNOSIS — I129 Hypertensive chronic kidney disease with stage 1 through stage 4 chronic kidney disease, or unspecified chronic kidney disease: Secondary | ICD-10-CM | POA: Diagnosis not present

## 2020-10-04 DIAGNOSIS — R600 Localized edema: Secondary | ICD-10-CM | POA: Diagnosis not present

## 2020-10-28 ENCOUNTER — Other Ambulatory Visit: Payer: Self-pay

## 2020-10-28 ENCOUNTER — Ambulatory Visit
Admission: RE | Admit: 2020-10-28 | Discharge: 2020-10-28 | Disposition: A | Discharge status: Home / Self Care | Payer: Medicare HMO | Source: Ambulatory Visit | Attending: Otolaryngology | Admitting: Otolaryngology

## 2020-10-28 DIAGNOSIS — R55 Syncope and collapse: Secondary | ICD-10-CM | POA: Diagnosis not present

## 2020-10-28 DIAGNOSIS — I10 Essential (primary) hypertension: Secondary | ICD-10-CM | POA: Diagnosis not present

## 2020-10-28 DIAGNOSIS — R42 Dizziness and giddiness: Secondary | ICD-10-CM | POA: Diagnosis not present

## 2020-10-28 DIAGNOSIS — E785 Hyperlipidemia, unspecified: Secondary | ICD-10-CM | POA: Diagnosis not present

## 2020-11-10 DIAGNOSIS — R42 Dizziness and giddiness: Secondary | ICD-10-CM | POA: Diagnosis not present

## 2020-11-17 DIAGNOSIS — H8111 Benign paroxysmal vertigo, right ear: Secondary | ICD-10-CM | POA: Diagnosis not present

## 2020-11-24 DIAGNOSIS — E7800 Pure hypercholesterolemia, unspecified: Secondary | ICD-10-CM | POA: Diagnosis not present

## 2020-11-24 DIAGNOSIS — E039 Hypothyroidism, unspecified: Secondary | ICD-10-CM | POA: Diagnosis not present

## 2020-11-24 DIAGNOSIS — H402233 Chronic angle-closure glaucoma, bilateral, severe stage: Secondary | ICD-10-CM | POA: Diagnosis not present

## 2020-11-24 DIAGNOSIS — N1831 Chronic kidney disease, stage 3a: Secondary | ICD-10-CM | POA: Diagnosis not present

## 2020-11-30 DIAGNOSIS — E785 Hyperlipidemia, unspecified: Secondary | ICD-10-CM | POA: Diagnosis not present

## 2020-11-30 DIAGNOSIS — I129 Hypertensive chronic kidney disease with stage 1 through stage 4 chronic kidney disease, or unspecified chronic kidney disease: Secondary | ICD-10-CM | POA: Diagnosis not present

## 2020-11-30 DIAGNOSIS — E039 Hypothyroidism, unspecified: Secondary | ICD-10-CM | POA: Diagnosis not present

## 2020-11-30 DIAGNOSIS — N183 Chronic kidney disease, stage 3 unspecified: Secondary | ICD-10-CM | POA: Diagnosis not present

## 2020-12-01 DIAGNOSIS — H2513 Age-related nuclear cataract, bilateral: Secondary | ICD-10-CM | POA: Diagnosis not present

## 2020-12-01 DIAGNOSIS — H402233 Chronic angle-closure glaucoma, bilateral, severe stage: Secondary | ICD-10-CM | POA: Diagnosis not present

## 2020-12-02 DIAGNOSIS — M5416 Radiculopathy, lumbar region: Secondary | ICD-10-CM | POA: Diagnosis not present

## 2020-12-02 DIAGNOSIS — M48062 Spinal stenosis, lumbar region with neurogenic claudication: Secondary | ICD-10-CM | POA: Diagnosis not present

## 2020-12-02 DIAGNOSIS — M5136 Other intervertebral disc degeneration, lumbar region: Secondary | ICD-10-CM | POA: Diagnosis not present

## 2020-12-02 DIAGNOSIS — M47816 Spondylosis without myelopathy or radiculopathy, lumbar region: Secondary | ICD-10-CM | POA: Diagnosis not present

## 2020-12-07 ENCOUNTER — Ambulatory Visit (INDEPENDENT_AMBULATORY_CARE_PROVIDER_SITE_OTHER): Payer: Medicare HMO | Admitting: Vascular Surgery

## 2020-12-07 ENCOUNTER — Other Ambulatory Visit: Payer: Self-pay

## 2020-12-07 ENCOUNTER — Encounter (INDEPENDENT_AMBULATORY_CARE_PROVIDER_SITE_OTHER): Payer: Self-pay | Admitting: Vascular Surgery

## 2020-12-07 VITALS — BP 128/71 | HR 75 | Resp 16 | Ht 60.0 in | Wt 182.0 lb

## 2020-12-07 DIAGNOSIS — I10 Essential (primary) hypertension: Secondary | ICD-10-CM | POA: Diagnosis not present

## 2020-12-07 DIAGNOSIS — E785 Hyperlipidemia, unspecified: Secondary | ICD-10-CM | POA: Insufficient documentation

## 2020-12-07 DIAGNOSIS — I6529 Occlusion and stenosis of unspecified carotid artery: Secondary | ICD-10-CM | POA: Insufficient documentation

## 2020-12-07 DIAGNOSIS — I6523 Occlusion and stenosis of bilateral carotid arteries: Secondary | ICD-10-CM | POA: Diagnosis not present

## 2020-12-07 NOTE — Assessment & Plan Note (Signed)
a carotid duplex last month which I have reviewed.  This demonstrates mild carotid artery stenosis bilaterally in the 1 to 39% range.  Both vertebral arteries demonstrated antegrade flow.  The patient is on appropriate medical therapy with aspirin and a statin agent.  This was unlikely the cause of her dizziness.  No role for intervention with mild disease.  Recheck in 2 years.

## 2020-12-07 NOTE — Assessment & Plan Note (Signed)
lipid control important in reducing the progression of atherosclerotic disease. Continue statin therapy  

## 2020-12-07 NOTE — Progress Notes (Signed)
Patient ID: Cynthia Mahoney, female   DOB: 16-Apr-1935, 86 y.o.   MRN: [id number]  Chief Complaint  Patient presents with   New Patient (Initial Visit)    Ref Vaught vertigo, dizzy spells    HPI Cynthia Mahoney is a 86 y.o. female.  I am asked to see the patient by Dr. Pryor Ochoa for evaluation of carotid disease.  The patient reports significant dizziness which prompted her work-up.  This is now actually been corrected with vestibular treatment by Dr. Pryor Ochoa.  This stopped a couple of weeks ago.  Before that, it was persistent and debilitating.  This prompted a carotid duplex last month which I have reviewed.  This demonstrates mild carotid artery stenosis bilaterally in the 1 to 39% range.  Both vertebral arteries demonstrated antegrade flow.   Past Medical History:  Diagnosis Date   Anxiety    Arthritis    Asthma    GERD (gastroesophageal reflux disease)    Headache(784.0)    stress   Hypertension    Hypothyroidism    PONV (postoperative nausea and vomiting)     Past Surgical History:  Procedure Laterality Date   [facility]   JOINT REPLACEMENT Bilateral 2008, 2013   knee   SHOULDER ARTHROSCOPY WITH OPEN ROTATOR CUFF REPAIR Right 04/2012   TONSILLECTOMY      Family History  Problem Relation Age of Onset   Breast cancer Neg Hx   No bleeding disorders, clotting disorders, or aneurysms   Social History   Tobacco Use   Smoking status: Never   Smokeless tobacco: Never  Substance Use Topics   Alcohol use: No   Drug use: No     Allergies  Allergen Reactions   Azithromycin    Clavulanic Acid    Amoxicillin Hives and Rash   Erythromycin Hives and Rash    Current Outpatient Medications  Medication Sig Dispense Refill   albuterol (VENTOLIN HFA) 108 (90 Base) MCG/ACT inhaler SMARTSIG:2 Puff(s) By Mouth Every 6 Hours PRN     amLODipine (NORVASC) 10 MG tablet Take 1 tablet (10 mg total) by mouth daily. 30 tablet 0   aspirin  81 MG tablet Take 81 mg by mouth daily.     brimonidine (ALPHAGAN) 0.2 % ophthalmic solution Place 1 drop into both eyes 2 (two) times daily.      budesonide (PULMICORT) 0.5 MG/2ML nebulizer solution Take by nebulization.     busPIRone (BUSPAR) 10 MG tablet Take 10 mg by mouth 2 (two) times daily.     calcium carbonate (OS-CAL) 600 MG TABS tablet Take 600 mg by mouth daily.     cetirizine (ZYRTEC) 10 MG tablet TAKE 1 TABLET (10 MG TOTAL) BY MOUTH ONCE DAILY     dorzolamide-timolol (COSOPT) 22.3-6.8 MG/ML ophthalmic solution Place 1 drop into both eyes 2 (two) times daily.     Doxylamine Succinate, Sleep, (SLEEP AID PO) Take 1 tablet by mouth at bedtime.     Dupilumab 300 MG/2ML SOPN Inject 2 mLs (300 mg total) subcutaneously every 14 (fourteen) days     furosemide (LASIX) 40 MG tablet Take 0.5 tablets (20 mg total) by mouth every morning. 30 tablet 1   gabapentin (NEURONTIN) 300 MG capsule Take 300-600 mg by mouth 3 (three) times daily.      latanoprost (XALATAN) 0.005 % ophthalmic solution Place 1 drop into both eyes at bedtime.     levothyroxine (SYNTHROID, LEVOTHROID) 88 MCG tablet Take 88 mcg  by mouth daily before breakfast.     magnesium oxide (MAG-OX) 400 MG tablet Take 400 mg by mouth daily.     metoprolol tartrate (LOPRESSOR) 50 MG tablet Take 1 tablet (50 mg total) by mouth 2 (two) times daily. 60 tablet 1   montelukast (SINGULAIR) 10 MG tablet Take 1 tablet by mouth at bedtime.      omeprazole (PRILOSEC) 20 MG capsule Take 20 mg by mouth 2 (two) times daily before a meal.      pravastatin (PRAVACHOL) 40 MG tablet Take 40 mg by mouth at bedtime.      Multiple Vitamin (MULTIVITAMIN WITH MINERALS) TABS tablet Take 1 tablet by mouth daily. (Patient not taking: Reported on 12/07/2020)     Multiple Vitamins-Minerals (VISION FORMULA/LUTEIN PO) Take 1 tablet by mouth daily. (Patient not taking: Reported on 12/07/2020)     ondansetron (ZOFRAN-ODT) 4 MG disintegrating tablet Take 4 mg by mouth  every 8 (eight) hours as needed. (Patient not taking: Reported on 12/07/2020)     No current facility-administered medications for this visit.      REVIEW OF SYSTEMS (Negative unless checked)  Constitutional: '[]'$ Weight loss  '[]'$ Fever  '[]'$ Chills Cardiac: '[]'$ Chest pain   '[]'$ Chest pressure   '[]'$ Palpitations   '[]'$ Shortness of breath when laying flat   '[]'$ Shortness of breath at rest   '[]'$ Shortness of breath with exertion. Vascular:  '[]'$ Pain in legs with walking   '[]'$ Pain in legs at rest   '[]'$ Pain in legs when laying flat   '[]'$ Claudication   '[]'$ Pain in feet when walking  '[]'$ Pain in feet at rest  '[]'$ Pain in feet when laying flat   '[]'$ History of DVT   '[]'$ Phlebitis   '[]'$ Swelling in legs   '[]'$ Varicose veins   '[]'$ Non-healing ulcers Pulmonary:   '[]'$ Uses home oxygen   '[]'$ Productive cough   '[]'$ Hemoptysis   '[]'$ Wheeze  '[]'$ COPD   '[]'$ Asthma Neurologic:  '[x]'$ Dizziness  '[]'$ Blackouts   '[]'$ Seizures   '[]'$ History of stroke   '[]'$ History of TIA  '[]'$ Aphasia   '[]'$ Temporary blindness   '[]'$ Dysphagia   '[]'$ Weakness or numbness in arms   '[]'$ Weakness or numbness in legs Musculoskeletal:  '[]'$ Arthritis   '[]'$ Joint swelling   '[]'$ Joint pain   '[]'$ Low back pain Hematologic:  '[]'$ Easy bruising  '[]'$ Easy bleeding   '[]'$ Hypercoagulable state   '[]'$ Anemic  '[]'$ Hepatitis Gastrointestinal:  '[]'$ Blood in stool   '[]'$ Vomiting blood  '[]'$ Gastroesophageal reflux/heartburn   '[]'$ Abdominal pain Genitourinary:  '[]'$ Chronic kidney disease   '[]'$ Difficult urination  '[]'$ Frequent urination  '[]'$ Burning with urination   '[]'$ Hematuria Skin:  '[]'$ Rashes   '[]'$ Ulcers   '[]'$ Wounds Psychological:  '[]'$ History of anxiety   '[]'$  History of major depression.    Physical Exam BP 128/71 (BP Location: Right Arm)   Pulse 75   Resp 16   Ht 5' (1.524 m)   Wt 182 lb (82.6 kg)   BMI 35.54 kg/m  Gen:  WD/WN, NAD. Appears younger than stated age. Head: [place]/AT, No temporalis wasting.  Ear/Nose/Throat: Hearing grossly intact, nares w/o erythema or drainage, oropharynx w/o Erythema/Exudate Eyes: Conjunctiva clear, sclera non-icteric   Neck: trachea midline.  No bruit or JVD.  Pulmonary:  Good air movement, clear to auscultation bilaterally.  Cardiac: RRR, normal S1, S2, Vascular:  Vessel Right Left  Radial Palpable Palpable                                   Gastrointestinal: soft, non-tender/non-distended.  Musculoskeletal: M/S 5/5 throughout.  Extremities without  ischemic changes.  No deformity or atrophy. 1+ BLE edema. Neurologic: Sensation grossly intact in extremities.  Symmetrical.  Speech is fluent. Motor exam as listed above. Psychiatric: Judgment intact, Mood & affect appropriate for pt's clinical situation. Dermatologic: No rashes or ulcers noted.  No cellulitis or open wounds.    Radiology No results found.  Labs No results found for this or any previous visit (from the past 2160 hour(s)).  Assessment/Plan:  Carotid stenosis a carotid duplex last month which I have reviewed.  This demonstrates mild carotid artery stenosis bilaterally in the 1 to 39% range.  Both vertebral arteries demonstrated antegrade flow.  The patient is on appropriate medical therapy with aspirin and a statin agent.  This was unlikely the cause of her dizziness.  No role for intervention with mild disease.  Recheck in 2 years.  HTN (hypertension) blood pressure control important in reducing the progression of atherosclerotic disease. On appropriate oral medications.   Hyperlipidemia lipid control important in reducing the progression of atherosclerotic disease. Continue statin therapy      Leotis Pain 12/07/2020, 12:47 PM   This note was created with Dragon medical transcription system.  Any errors from dictation are unintentional.

## 2020-12-07 NOTE — Assessment & Plan Note (Signed)
blood pressure control important in reducing the progression of atherosclerotic disease. On appropriate oral medications.  

## 2021-01-05 ENCOUNTER — Ambulatory Visit: Payer: Medicare HMO | Attending: Otolaryngology | Admitting: Physical Therapy

## 2021-01-10 ENCOUNTER — Ambulatory Visit: Admit: 2021-01-10 | Payer: Medicare HMO | Admitting: Ophthalmology

## 2021-01-10 DIAGNOSIS — M47816 Spondylosis without myelopathy or radiculopathy, lumbar region: Secondary | ICD-10-CM | POA: Diagnosis not present

## 2021-01-10 SURGERY — PHACOEMULSIFICATION, CATARACT, WITH IOL INSERTION
Anesthesia: Topical | Laterality: Right

## 2021-01-11 ENCOUNTER — Encounter: Payer: Medicare HMO | Admitting: Physical Therapy

## 2021-01-18 ENCOUNTER — Encounter: Payer: Medicare HMO | Admitting: Physical Therapy

## 2021-01-24 ENCOUNTER — Ambulatory Visit: Admit: 2021-01-24 | Payer: Medicare HMO | Admitting: Ophthalmology

## 2021-01-24 DIAGNOSIS — M5136 Other intervertebral disc degeneration, lumbar region: Secondary | ICD-10-CM | POA: Diagnosis not present

## 2021-01-24 DIAGNOSIS — M48062 Spinal stenosis, lumbar region with neurogenic claudication: Secondary | ICD-10-CM | POA: Diagnosis not present

## 2021-01-24 DIAGNOSIS — M5416 Radiculopathy, lumbar region: Secondary | ICD-10-CM | POA: Diagnosis not present

## 2021-01-24 SURGERY — PHACOEMULSIFICATION, CATARACT, WITH IOL INSERTION
Anesthesia: Topical | Laterality: Left

## 2021-02-01 ENCOUNTER — Encounter: Payer: Medicare HMO | Admitting: Physical Therapy

## 2021-02-08 ENCOUNTER — Encounter: Payer: Medicare HMO | Admitting: Physical Therapy

## 2021-02-15 ENCOUNTER — Encounter: Payer: Medicare HMO | Admitting: Physical Therapy

## 2021-02-16 DIAGNOSIS — H2511 Age-related nuclear cataract, right eye: Secondary | ICD-10-CM | POA: Diagnosis not present

## 2021-02-21 ENCOUNTER — Encounter: Payer: Self-pay | Admitting: Ophthalmology

## 2021-02-22 ENCOUNTER — Encounter: Payer: Medicare HMO | Admitting: Physical Therapy

## 2021-02-24 NOTE — Discharge Instructions (Signed)

## 2021-03-01 ENCOUNTER — Encounter: Payer: Medicare HMO | Admitting: Physical Therapy

## 2021-03-01 DIAGNOSIS — H2512 Age-related nuclear cataract, left eye: Secondary | ICD-10-CM | POA: Diagnosis not present

## 2021-03-16 DIAGNOSIS — S81811A Laceration without foreign body, right lower leg, initial encounter: Secondary | ICD-10-CM | POA: Diagnosis not present

## 2021-03-16 DIAGNOSIS — Z23 Encounter for immunization: Secondary | ICD-10-CM | POA: Diagnosis not present

## 2021-03-29 ENCOUNTER — Encounter: Payer: Self-pay | Admitting: Ophthalmology

## 2021-04-05 DIAGNOSIS — S81811D Laceration without foreign body, right lower leg, subsequent encounter: Secondary | ICD-10-CM | POA: Diagnosis not present

## 2021-04-11 ENCOUNTER — Encounter: Payer: Self-pay | Admitting: Ophthalmology

## 2021-04-11 ENCOUNTER — Other Ambulatory Visit: Payer: Self-pay

## 2021-04-11 ENCOUNTER — Ambulatory Visit: Payer: Medicare HMO | Admitting: Anesthesiology

## 2021-04-11 ENCOUNTER — Encounter
Admission: RE | Disposition: A | Discharge status: Home / Self Care | Payer: Self-pay | Source: Ambulatory Visit | Attending: Ophthalmology

## 2021-04-11 ENCOUNTER — Ambulatory Visit
Admission: RE | Admit: 2021-04-11 | Discharge: 2021-04-11 | Disposition: A | Discharge status: Home / Self Care | Payer: Medicare HMO | Source: Ambulatory Visit | Attending: Ophthalmology | Admitting: Ophthalmology

## 2021-04-11 DIAGNOSIS — H2511 Age-related nuclear cataract, right eye: Secondary | ICD-10-CM | POA: Insufficient documentation

## 2021-04-11 DIAGNOSIS — I10 Essential (primary) hypertension: Secondary | ICD-10-CM | POA: Diagnosis not present

## 2021-04-11 DIAGNOSIS — H25811 Combined forms of age-related cataract, right eye: Secondary | ICD-10-CM | POA: Diagnosis not present

## 2021-04-11 HISTORY — PX: CATARACT EXTRACTION W/PHACO: SHX586

## 2021-04-11 HISTORY — DX: Dizziness and giddiness: R42

## 2021-04-11 HISTORY — DX: Presence of dental prosthetic device (complete) (partial): Z97.2

## 2021-04-11 SURGERY — PHACOEMULSIFICATION, CATARACT, WITH IOL INSERTION
Anesthesia: Monitor Anesthesia Care | Site: Eye | Laterality: Right

## 2021-04-11 MED ORDER — MOXIFLOXACIN HCL 0.5 % OP SOLN
OPHTHALMIC | Status: DC | PRN
  Administered 2021-04-11: 0.2 mL via OPHTHALMIC

## 2021-04-11 MED ORDER — ARMC OPHTHALMIC DILATING DROPS
1.0000 "application " | OPHTHALMIC | Status: DC | PRN
  Administered 2021-04-11 (×3): 1 via OPHTHALMIC

## 2021-04-11 MED ORDER — TETRACAINE HCL 0.5 % OP SOLN
1.0000 [drp] | OPHTHALMIC | Status: DC | PRN
  Administered 2021-04-11 (×3): 1 [drp] via OPHTHALMIC

## 2021-04-11 MED ORDER — ACETAMINOPHEN 160 MG/5ML PO SOLN: 975.0000 mg | Freq: Once | ORAL | Status: DC | PRN

## 2021-04-11 MED ORDER — SIGHTPATH DOSE#1 BSS IO SOLN
INTRAOCULAR | Status: DC | PRN
  Administered 2021-04-11: 15 mL

## 2021-04-11 MED ORDER — ACETAMINOPHEN 500 MG PO TABS: 1000.0000 mg | ORAL_TABLET | Freq: Once | ORAL | Status: DC | PRN

## 2021-04-11 MED ORDER — LACTATED RINGERS IV SOLN: INTRAVENOUS | Status: DC

## 2021-04-11 MED ORDER — SIGHTPATH DOSE#1 SODIUM HYALURONATE 23 MG/ML IO SOLUTION
PREFILLED_SYRINGE | INTRAOCULAR | Status: DC | PRN
  Administered 2021-04-11: 0.6 mL via INTRAOCULAR

## 2021-04-11 MED ORDER — ONDANSETRON HCL 4 MG/2ML IJ SOLN: 4.0000 mg | Freq: Once | INTRAMUSCULAR | Status: DC | PRN

## 2021-04-11 MED ORDER — FENTANYL CITRATE (PF) 100 MCG/2ML IJ SOLN
INTRAMUSCULAR | Status: DC | PRN
  Administered 2021-04-11: 50 ug via INTRAVENOUS

## 2021-04-11 MED ORDER — SIGHTPATH DOSE#1 SODIUM HYALURONATE 10 MG/ML IO SOLUTION
PREFILLED_SYRINGE | INTRAOCULAR | Status: DC | PRN
  Administered 2021-04-11: 0.85 mL via INTRAOCULAR

## 2021-04-11 MED ORDER — MIDAZOLAM HCL 2 MG/2ML IJ SOLN
INTRAMUSCULAR | Status: DC | PRN
  Administered 2021-04-11: 1 mg via INTRAVENOUS

## 2021-04-11 MED ORDER — LIDOCAINE HCL (PF) 2 % IJ SOLN
INTRAOCULAR | Status: DC | PRN
  Administered 2021-04-11: 10:00:00 .5 mL via INTRAOCULAR

## 2021-04-11 MED ORDER — SIGHTPATH DOSE#1 BSS IO SOLN
INTRAOCULAR | Status: DC | PRN
  Administered 2021-04-11: 10:00:00 74 mL via OPHTHALMIC

## 2021-04-11 SURGICAL SUPPLY — 13 items
CANNULA ANT/CHMB 27GA (MISCELLANEOUS) ×2 IMPLANT
DISSECTOR HYDRO NUCLEUS 50X22 (MISCELLANEOUS) ×2 IMPLANT
GLOVE SURG GAMMEX PI TX LF 7.5 (GLOVE) ×2 IMPLANT
GLOVE SURG SYN 8.5  E (GLOVE) ×1
GLOVE SURG SYN 8.5 E (GLOVE) ×1 IMPLANT
GOWN STRL REUS W/ TWL LRG LVL3 (GOWN DISPOSABLE) ×2 IMPLANT
GOWN STRL REUS W/TWL LRG LVL3 (GOWN DISPOSABLE) ×4
LENS IOL TECNIS EYHANCE 22.5 (Intraocular Lens) ×2 IMPLANT
PACK EYE AFTER SURG (MISCELLANEOUS) ×2 IMPLANT
SYR 3ML LL SCALE MARK (SYRINGE) ×2 IMPLANT
SYR TB 1ML LUER SLIP (SYRINGE) ×2 IMPLANT
WATER STERILE IRR 250ML POUR (IV SOLUTION) ×2 IMPLANT
WIPE NON LINTING 3.25X3.25 (MISCELLANEOUS) ×2 IMPLANT

## 2021-04-11 NOTE — H&P (Signed)
[facility]   Primary Care Physician:  Derinda Late, MD Ophthalmologist: Dr. Benay Pillow  Pre-Procedure History & Physical: HPI:  Cynthia Mahoney is a 86 y.o. female here for cataract surgery.   Past Medical History:  Diagnosis Date   Anxiety    Arthritis    Asthma    GERD (gastroesophageal reflux disease)    Headache(784.0)    stress   Hypertension    Hypothyroidism    PONV (postoperative nausea and vomiting)    Vertigo    Wears dentures    full upper    Past Surgical History:  Procedure Laterality Date   [facility]   JOINT REPLACEMENT Bilateral 2008, 2013   knee   SHOULDER ARTHROSCOPY WITH OPEN ROTATOR CUFF REPAIR Right 04/2012   TONSILLECTOMY      Prior to Admission medications   Medication Sig Start Date End Date Taking? Authorizing Provider  albuterol (VENTOLIN HFA) 108 (90 Base) MCG/ACT inhaler SMARTSIG:2 Puff(s) By Mouth Every 6 Hours PRN 12/19/19  Yes [provider]  amLODipine (NORVASC) 10 MG tablet Take 1 tablet (10 mg total) by mouth daily. 01/01/20 02/21/21 Yes Shahmehdi, Valeria Batman, MD  aspirin 81 MG tablet Take 81 mg by mouth daily.   Yes [provider]  brimonidine (ALPHAGAN) 0.2 % ophthalmic solution Place 1 drop into both eyes 2 (two) times daily.  12/03/19  Yes [provider]  budesonide (PULMICORT) 0.5 MG/2ML nebulizer solution Take by nebulization. 12/15/19  Yes [provider]  calcium carbonate (OS-CAL) 600 MG TABS tablet Take 600 mg by mouth daily.   Yes [provider]  cetirizine (ZYRTEC) 10 MG tablet TAKE 1 TABLET (10 MG TOTAL) BY MOUTH ONCE DAILY 10/09/19  Yes [provider]  dorzolamide-timolol (COSOPT) 22.3-6.8 MG/ML ophthalmic solution Place 1 drop into both eyes 2 (two) times daily.   Yes [provider]  Doxylamine Succinate, Sleep, (SLEEP AID PO) Take 1 tablet by mouth at bedtime.   Yes [provider]  Dupilumab 300  MG/2ML SOPN Inject 2 mLs (300 mg total) subcutaneously every 14 (fourteen) days 12/18/19  Yes [provider]  furosemide (LASIX) 40 MG tablet Take 0.5 tablets (20 mg total) by mouth every morning. 01/01/20 02/21/21 Yes Shahmehdi, Seyed A, MD  gabapentin (NEURONTIN) 300 MG capsule Take 300-600 mg by mouth 3 (three) times daily.    Yes [provider]  latanoprost (XALATAN) 0.005 % ophthalmic solution Place 1 drop into both eyes at bedtime.   Yes [provider]  levothyroxine (SYNTHROID, LEVOTHROID) 88 MCG tablet Take 100 mcg by mouth daily before breakfast.   Yes [provider]  magnesium oxide (MAG-OX) 400 MG tablet Take 400 mg by mouth daily.   Yes [provider]  metoprolol tartrate (LOPRESSOR) 50 MG tablet Take 1 tablet (50 mg total) by mouth 2 (two) times daily. 01/01/20 02/21/21 Yes Shahmehdi, Seyed A, MD  montelukast (SINGULAIR) 10 MG tablet Take 1 tablet by mouth at bedtime.  12/02/16  Yes [provider]  Multiple Vitamin (MULTIVITAMIN WITH MINERALS) TABS tablet Take 1 tablet by mouth daily.   Yes [provider]  Multiple Vitamins-Minerals (VISION FORMULA/LUTEIN PO) Take 1 tablet by mouth daily.   Yes [provider]  omeprazole (PRILOSEC) 20 MG capsule Take 20 mg by mouth 2 (two) times daily before a meal.    Yes [provider]  ondansetron (ZOFRAN-ODT) 4 MG disintegrating tablet Take 4 mg by mouth  every 8 (eight) hours as needed. 12/22/19  Yes [provider]  potassium chloride (KLOR-CON) 10 MEQ tablet Take 10 mEq by mouth daily.   Yes [provider]  pravastatin (PRAVACHOL) 40 MG tablet Take 40 mg by mouth at bedtime.    Yes [provider]  busPIRone (BUSPAR) 10 MG tablet Take 10 mg by mouth 2 (two) times daily. Patient not taking: Reported on 02/21/2021 12/02/19   [provider]    Allergies as of 01/27/2021 - Review Complete 12/07/2020  Allergen Reaction Noted    Azithromycin  12/07/2020   Clavulanic acid  12/07/2020   Amoxicillin Hives and Rash 01/06/2013   Erythromycin Hives and Rash 01/06/2013    Family History  Problem Relation Age of Onset   Breast cancer Neg Hx     Social History   Socioeconomic History   Marital status: Widowed    Spouse name: Not on file   Number of children: Not on file   Years of education: Not on file   Highest education level: Not on file  Occupational History   Not on file  Tobacco Use   Smoking status: Never   Smokeless tobacco: Never  Vaping Use   Vaping Use: Never used  Substance and Sexual Activity   Alcohol use: No   Drug use: No   Sexual activity: Not on file  Other Topics Concern   Not on file  Social History Narrative   Not on file   Social Determinants of Health   Financial Resource Strain: Not on file  Food Insecurity: Not on file  Transportation Needs: Not on file  Physical Activity: Not on file  Stress: Not on file  Social Connections: Not on file  Intimate Partner Violence: Not on file    Review of Systems: See HPI, otherwise negative ROS  Physical Exam: BP (!) 157/79   Pulse 62   Temp 97.7 F (36.5 C) (Temporal)   Resp 20   Ht 5\' 1"  (1.549 m)   Wt 82.1 kg   SpO2 98%   BMI 34.20 kg/m  General:   Alert, cooperative in NAD Head:  Normocephalic and atraumatic. Respiratory:  Normal work of breathing. Cardiovascular:  RRR  Impression/Plan: Cynthia Mahoney is here for cataract surgery.  Risks, benefits, limitations, and alternatives regarding cataract surgery have been reviewed with the patient.  Questions have been answered.  All parties agreeable.   Benay Pillow, MD  04/11/2021, 9:43 AM

## 2021-04-11 NOTE — Transfer of Care (Signed)
Immediate Anesthesia Transfer of Care Note  Patient: Cynthia Mahoney  Procedure(s) Performed: CATARACT EXTRACTION PHACO AND INTRAOCULAR LENS PLACEMENT (IOC) RIGHT (Right: Eye)  Patient Location: PACU  Anesthesia Type: MAC  Level of Consciousness: awake, alert  and patient cooperative  Airway and Oxygen Therapy: Patient Spontanous Breathing and Patient connected to supplemental oxygen  Post-op Assessment: Post-op Vital signs reviewed, Patient's Cardiovascular Status Stable, Respiratory Function Stable, Patent Airway and No signs of Nausea or vomiting  Post-op Vital Signs: Reviewed and stable  Complications: No notable events documented.

## 2021-04-11 NOTE — Op Note (Signed)
OPERATIVE NOTE  Cynthia Mahoney [id number] 04/11/2021   PREOPERATIVE DIAGNOSIS:  Nuclear sclerotic cataract right eye.  H25.11   POSTOPERATIVE DIAGNOSIS:    Nuclear sclerotic cataract right eye.     PROCEDURE:  Phacoemusification with posterior chamber intraocular lens placement of the right eye   LENS:   Implant Name Type Inv. Item Serial No. Manufacturer Lot No. LRB No. Used Action  LENS IOL TECNIS EYHANCE 22.5 - [id number] Intraocular Lens LENS IOL TECNIS EYHANCE 22.5 [id number] [id number]   Right 1 Implanted       Procedure(s) with comments: CATARACT EXTRACTION PHACO AND INTRAOCULAR LENS PLACEMENT (IOC) RIGHT (Right) - 3.74 00:30.8  DIB00 +22.5   SURGEON:  Benay Pillow, MD, MPH  ANESTHESIOLOGIST: Anesthesiologist: Veda Canning, MD CRNA: Cameron Ali, CRNA   ANESTHESIA:  Topical with tetracaine drops augmented with 1% preservative-free intracameral lidocaine.  ESTIMATED BLOOD LOSS: less than 1 mL.   COMPLICATIONS:  None.   DESCRIPTION OF PROCEDURE:  The patient was identified in the holding room and transported to the operating room and placed in the supine position under the operating microscope.  The right eye was identified as the operative eye and it was prepped and draped in the usual sterile ophthalmic fashion.   A 1.0 millimeter clear-corneal paracentesis was made at the 10:30 position. 0.5 ml of preservative-free 1% lidocaine with epinephrine was injected into the anterior chamber.  The anterior chamber was filled with Healon 5 viscoelastic.  A 2.4 millimeter keratome was used to make a near-clear corneal incision at the 8:00 position.  A curvilinear capsulorrhexis was made with a cystotome and capsulorrhexis forceps.  Balanced salt solution was used to hydrodissect and hydrodelineate the nucleus.   Phacoemulsification was then used in stop and chop fashion to remove the lens nucleus and epinucleus.  The remaining cortex was then removed using the irrigation and  aspiration handpiece. Healon was then placed into the capsular bag to distend it for lens placement.  A lens was then injected into the capsular bag.  The remaining viscoelastic was aspirated.   Wounds were hydrated with balanced salt solution.  The anterior chamber was inflated to a physiologic pressure with balanced salt solution.   Intracameral vigamox 0.1 mL undiluted was injected into the eye and a drop placed onto the ocular surface.  No wound leaks were noted.  The patient was taken to the recovery room in stable condition without complications of anesthesia or surgery  [name] 04/11/2021, 10:13 AM

## 2021-04-11 NOTE — Anesthesia Procedure Notes (Signed)
Procedure Name: MAC Date/Time: 04/11/2021 9:53 AM Performed by: Cameron Ali, CRNA Pre-anesthesia Checklist: Patient identified, Emergency Drugs available, Suction available, Timeout performed and Patient being monitored Patient Re-evaluated:Patient Re-evaluated prior to induction Oxygen Delivery Method: Nasal cannula Placement Confirmation: positive ETCO2

## 2021-04-11 NOTE — Anesthesia Preprocedure Evaluation (Signed)
Anesthesia Evaluation  Patient identified by MRN, date of birth, ID band Patient awake    History of Anesthesia Complications (+) PONV  Airway Mallampati: II  TM Distance: >3 FB     Dental   Pulmonary asthma ,    Pulmonary exam normal        Cardiovascular hypertension,  Rhythm:Regular Rate:Normal     Neuro/Psych  Headaches, Anxiety    GI/Hepatic GERD  ,  Endo/Other  Hypothyroidism BMI > 30  Renal/GU      Musculoskeletal  (+) Arthritis ,   Abdominal   Peds  Hematology   Anesthesia Other Findings   Reproductive/Obstetrics                             Anesthesia Physical Anesthesia Plan  ASA: 3  Anesthesia Plan: MAC   Post-op Pain Management:    Induction: Intravenous  PONV Risk Score and Plan: TIVA, Midazolam and Treatment may vary due to age or medical condition  Airway Management Planned: Natural Airway and Nasal Cannula  Additional Equipment:   Intra-op Plan:   Post-operative Plan:   Informed Consent: I have reviewed the patients History and Physical, chart, labs and discussed the procedure including the risks, benefits and alternatives for the proposed anesthesia with the patient or authorized representative who has indicated his/her understanding and acceptance.       Plan Discussed with: CRNA  Anesthesia Plan Comments:         Anesthesia Quick Evaluation

## 2021-04-11 NOTE — Anesthesia Postprocedure Evaluation (Signed)
Anesthesia Post Note  Patient: Cynthia Mahoney  Procedure(s) Performed: CATARACT EXTRACTION PHACO AND INTRAOCULAR LENS PLACEMENT (IOC) RIGHT (Right: Eye)     Patient location during evaluation: PACU Anesthesia Type: MAC Level of consciousness: awake Pain management: pain level controlled Vital Signs Assessment: post-procedure vital signs reviewed and stable Respiratory status: respiratory function stable Cardiovascular status: stable Postop Assessment: no apparent nausea or vomiting Anesthetic complications: no   No notable events documented.  Veda Canning

## 2021-04-12 ENCOUNTER — Encounter: Payer: Self-pay | Admitting: Ophthalmology

## 2021-04-19 NOTE — Discharge Instructions (Signed)

## 2021-04-25 ENCOUNTER — Ambulatory Visit
Admission: RE | Admit: 2021-04-25 | Discharge: 2021-04-25 | Disposition: A | Discharge status: Home / Self Care | Payer: Medicare HMO | Source: Ambulatory Visit | Attending: Ophthalmology | Admitting: Ophthalmology

## 2021-04-25 ENCOUNTER — Encounter
Admission: RE | Disposition: A | Discharge status: Home / Self Care | Payer: Self-pay | Source: Ambulatory Visit | Attending: Ophthalmology

## 2021-04-25 ENCOUNTER — Other Ambulatory Visit: Payer: Self-pay

## 2021-04-25 ENCOUNTER — Ambulatory Visit: Payer: Medicare HMO | Admitting: Anesthesiology

## 2021-04-25 ENCOUNTER — Encounter: Payer: Self-pay | Admitting: Ophthalmology

## 2021-04-25 DIAGNOSIS — H2512 Age-related nuclear cataract, left eye: Secondary | ICD-10-CM | POA: Insufficient documentation

## 2021-04-25 DIAGNOSIS — H25812 Combined forms of age-related cataract, left eye: Secondary | ICD-10-CM | POA: Diagnosis not present

## 2021-04-25 HISTORY — PX: CATARACT EXTRACTION W/PHACO: SHX586

## 2021-04-25 SURGERY — PHACOEMULSIFICATION, CATARACT, WITH IOL INSERTION
Anesthesia: Monitor Anesthesia Care | Site: Eye | Laterality: Left

## 2021-04-25 MED ORDER — FENTANYL CITRATE (PF) 100 MCG/2ML IJ SOLN
INTRAMUSCULAR | Status: DC | PRN
  Administered 2021-04-25: 50 ug via INTRAVENOUS

## 2021-04-25 MED ORDER — TETRACAINE HCL 0.5 % OP SOLN
1.0000 [drp] | OPHTHALMIC | Status: DC | PRN
  Administered 2021-04-25: 1 [drp] via OPHTHALMIC

## 2021-04-25 MED ORDER — SIGHTPATH DOSE#1 SODIUM HYALURONATE 10 MG/ML IO SOLUTION
PREFILLED_SYRINGE | INTRAOCULAR | Status: DC | PRN
  Administered 2021-04-25: 0.85 mL via INTRAOCULAR

## 2021-04-25 MED ORDER — ARMC OPHTHALMIC DILATING DROPS
1.0000 "application " | OPHTHALMIC | Status: DC | PRN
  Administered 2021-04-25: 1 via OPHTHALMIC

## 2021-04-25 MED ORDER — SIGHTPATH DOSE#1 BSS IO SOLN
INTRAOCULAR | Status: DC | PRN
  Administered 2021-04-25: 70 mL via OPHTHALMIC

## 2021-04-25 MED ORDER — LIDOCAINE HCL (PF) 2 % IJ SOLN
INTRAOCULAR | Status: DC | PRN
  Administered 2021-04-25: 1 mL via INTRAOCULAR

## 2021-04-25 MED ORDER — MIDAZOLAM HCL 2 MG/2ML IJ SOLN
INTRAMUSCULAR | Status: DC | PRN
  Administered 2021-04-25: 1 mg via INTRAVENOUS

## 2021-04-25 MED ORDER — SIGHTPATH DOSE#1 SODIUM HYALURONATE 23 MG/ML IO SOLUTION
PREFILLED_SYRINGE | INTRAOCULAR | Status: DC | PRN
  Administered 2021-04-25: 0.6 mL via INTRAOCULAR

## 2021-04-25 MED ORDER — TETRACAINE HCL 0.5 % OP SOLN
1.0000 [drp] | OPHTHALMIC | Status: DC | PRN
  Administered 2021-04-25 (×2): 1 [drp] via OPHTHALMIC

## 2021-04-25 MED ORDER — ARMC OPHTHALMIC DILATING DROPS
1.0000 "application " | OPHTHALMIC | Status: DC | PRN
  Administered 2021-04-25 (×2): 1 via OPHTHALMIC

## 2021-04-25 MED ORDER — SIGHTPATH DOSE#1 BSS IO SOLN
INTRAOCULAR | Status: DC | PRN
  Administered 2021-04-25: 15 mL

## 2021-04-25 MED ORDER — MOXIFLOXACIN HCL 0.5 % OP SOLN
OPHTHALMIC | Status: DC | PRN
  Administered 2021-04-25: 0.2 mL via OPHTHALMIC

## 2021-04-25 MED ORDER — LACTATED RINGERS IV SOLN: INTRAVENOUS | Status: DC

## 2021-04-25 SURGICAL SUPPLY — 20 items
CANNULA ANT/CHMB 27G (MISCELLANEOUS) ×1 IMPLANT
CANNULA ANT/CHMB 27GA (MISCELLANEOUS) ×2 IMPLANT
DISSECTOR HYDRO NUCLEUS 50X22 (MISCELLANEOUS) ×2 IMPLANT
GLOVE SURG GAMMEX PI TX LF 7.5 (GLOVE) ×2 IMPLANT
GLOVE SURG SYN 8.5  E (GLOVE) ×1
GLOVE SURG SYN 8.5 E (GLOVE) ×1 IMPLANT
GLOVE SURG SYN 8.5 PF PI (GLOVE) ×1 IMPLANT
GOWN STRL REUS W/ TWL LRG LVL3 (GOWN DISPOSABLE) ×2 IMPLANT
GOWN STRL REUS W/TWL LRG LVL3 (GOWN DISPOSABLE) ×4
LENS IOL TECNIS EYHANCE 23.0 (Intraocular Lens) ×1 IMPLANT
MARKER SKIN DUAL TIP RULER LAB (MISCELLANEOUS) ×2 IMPLANT
NDL FILTER BLUNT 18X1 1/2 (NEEDLE) ×1 IMPLANT
NEEDLE FILTER BLUNT 18X 1/2SAF (NEEDLE) ×1
NEEDLE FILTER BLUNT 18X1 1/2 (NEEDLE) ×1 IMPLANT
PACK EYE AFTER SURG (MISCELLANEOUS) ×2 IMPLANT
SYR 3ML LL SCALE MARK (SYRINGE) ×2 IMPLANT
SYR 5ML LL (SYRINGE) ×2 IMPLANT
SYR TB 1ML LUER SLIP (SYRINGE) ×2 IMPLANT
WATER STERILE IRR 250ML POUR (IV SOLUTION) ×2 IMPLANT
WIPE NON LINTING 3.25X3.25 (MISCELLANEOUS) ×2 IMPLANT

## 2021-04-25 NOTE — H&P (Signed)
[facility]   Primary Care Physician:  Derinda Late, MD Ophthalmologist: Dr. Benay Pillow  Pre-Procedure History & Physical: HPI:  Cynthia Mahoney is a 86 y.o. female here for cataract surgery.   Past Medical History:  Diagnosis Date   Anxiety    Arthritis    Asthma    GERD (gastroesophageal reflux disease)    Headache(784.0)    stress   Hypertension    Hypothyroidism    PONV (postoperative nausea and vomiting)    Vertigo    Wears dentures    full upper    Past Surgical History:  Procedure Laterality Date   [facility]   CATARACT EXTRACTION W/PHACO Right 04/11/2021   Procedure: CATARACT EXTRACTION PHACO AND INTRAOCULAR LENS PLACEMENT ([place]) RIGHT;  Surgeon: Eulogio Bear, MD;  Location: [place];  Service: Ophthalmology;  Laterality: Right;  3.74 00:30.8   JOINT REPLACEMENT Bilateral 2008, 2013   knee   SHOULDER ARTHROSCOPY WITH OPEN ROTATOR CUFF REPAIR Right 04/2012   TONSILLECTOMY      Prior to Admission medications   Medication Sig Start Date End Date Taking? Authorizing Provider  albuterol (VENTOLIN HFA) 108 (90 Base) MCG/ACT inhaler SMARTSIG:2 Puff(s) By Mouth Every 6 Hours PRN 12/19/19  Yes [provider]  aspirin 81 MG tablet Take 81 mg by mouth daily.   Yes [provider]  brimonidine (ALPHAGAN) 0.2 % ophthalmic solution Place 1 drop into both eyes 2 (two) times daily.  12/03/19  Yes [provider]  budesonide (PULMICORT) 0.5 MG/2ML nebulizer solution Take by nebulization. 12/15/19  Yes [provider]  busPIRone (BUSPAR) 10 MG tablet Take 10 mg by mouth 2 (two) times daily. 12/02/19  Yes [provider]  calcium carbonate (OS-CAL) 600 MG TABS tablet Take 600 mg by mouth daily.   Yes [provider]  cetirizine (ZYRTEC) 10 MG tablet TAKE 1 TABLET (10 MG TOTAL) BY MOUTH ONCE DAILY 10/09/19  Yes [provider]  dorzolamide-timolol (COSOPT)  22.3-6.8 MG/ML ophthalmic solution Place 1 drop into both eyes 2 (two) times daily.   Yes [provider]  Doxylamine Succinate, Sleep, (SLEEP AID PO) Take 1 tablet by mouth at bedtime.   Yes [provider]  Dupilumab 300 MG/2ML SOPN Inject 2 mLs (300 mg total) subcutaneously every 14 (fourteen) days 12/18/19  Yes [provider]  furosemide (LASIX) 40 MG tablet Take 0.5 tablets (20 mg total) by mouth every morning. 01/01/20 04/25/21 Yes Shahmehdi, Seyed A, MD  gabapentin (NEURONTIN) 300 MG capsule Take 300-600 mg by mouth 3 (three) times daily.    Yes [provider]  latanoprost (XALATAN) 0.005 % ophthalmic solution Place 1 drop into both eyes at bedtime.   Yes [provider]  levothyroxine (SYNTHROID, LEVOTHROID) 88 MCG tablet Take 100 mcg by mouth daily before breakfast.   Yes [provider]  magnesium oxide (MAG-OX) 400 MG tablet Take 400 mg by mouth daily.   Yes [provider]  metoprolol tartrate (LOPRESSOR) 50 MG tablet Take 1 tablet (50 mg total) by mouth 2 (two) times daily. 01/01/20 04/25/21 Yes Shahmehdi, Seyed A, MD  montelukast (SINGULAIR) 10 MG tablet Take 1 tablet by mouth at bedtime.  12/02/16  Yes [provider]  Multiple Vitamin (MULTIVITAMIN WITH MINERALS) TABS tablet Take 1 tablet by mouth daily.   Yes [provider]  Multiple Vitamins-Minerals (VISION FORMULA/LUTEIN PO) Take 1 tablet by mouth daily.   Yes [provider]  omeprazole (PRILOSEC) 20 MG capsule Take 20 mg by mouth 2 (two) times daily before a meal.    Yes [provider]  ondansetron (ZOFRAN-ODT) 4 MG disintegrating tablet Take 4 mg by mouth every 8 (eight) hours as needed. 12/22/19  Yes [provider]  potassium chloride (KLOR-CON) 10 MEQ tablet Take 10 mEq by mouth daily.   Yes [provider]  pravastatin (PRAVACHOL) 40 MG tablet Take 40 mg by mouth at bedtime.    Yes [provider]     Allergies as of 01/27/2021 - Review Complete 12/07/2020  Allergen Reaction Noted   Azithromycin  12/07/2020   Clavulanic acid  12/07/2020   Amoxicillin Hives and Rash 01/06/2013   Erythromycin Hives and Rash 01/06/2013    Family History  Problem Relation Age of Onset   Breast cancer Neg Hx     Social History   Socioeconomic History   Marital status: Widowed    Spouse name: Not on file   Number of children: Not on file   Years of education: Not on file   Highest education level: Not on file  Occupational History   Not on file  Tobacco Use   Smoking status: Never   Smokeless tobacco: Never  Vaping Use   Vaping Use: Never used  Substance and Sexual Activity   Alcohol use: No   Drug use: No   Sexual activity: Not on file  Other Topics Concern   Not on file  Social History Narrative   Not on file   Social Determinants of Health   Financial Resource Strain: Not on file  Food Insecurity: Not on file  Transportation Needs: Not on file  Physical Activity: Not on file  Stress: Not on file  Social Connections: Not on file  Intimate Partner Violence: Not on file    Review of Systems: See HPI, otherwise negative ROS  Physical Exam: BP (!) 164/97   Pulse 65   Temp (!) 97.3 F (36.3 C)   Wt 81.2 kg   SpO2 98%   BMI 33.82 kg/m  General:   Alert, cooperative in NAD Head:  Normocephalic and atraumatic. Respiratory:  Normal work of breathing. Cardiovascular:  RRR  Impression/Plan: Cynthia Mahoney is here for cataract surgery.  Risks, benefits, limitations, and alternatives regarding cataract surgery have been reviewed with the patient.  Questions have been answered.  All parties agreeable.   Benay Pillow, MD  04/25/2021, 9:06 AM

## 2021-04-25 NOTE — Op Note (Signed)
OPERATIVE NOTE  MALIHA OUTTEN [id number] 04/25/2021   PREOPERATIVE DIAGNOSIS:  Nuclear sclerotic cataract left eye.  H25.12   POSTOPERATIVE DIAGNOSIS:    Nuclear sclerotic cataract left eye.     PROCEDURE:  Phacoemusification with posterior chamber intraocular lens placement of the left eye   LENS:   Implant Name Type Inv. Item Serial No. Manufacturer Lot No. LRB No. Used Action  LENS IOL TECNIS EYHANCE 23.0 - [id number] Intraocular Lens LENS IOL TECNIS EYHANCE 23.0 [id number] JOHNSON   Left 1 Implanted      Procedure(s) with comments: CATARACT EXTRACTION PHACO AND INTRAOCULAR LENS PLACEMENT (IOC) LEFT (Left) - 3.27 00:29.5  DIB00 +23.0   SURGEON:  Benay Pillow, MD, MPH   ANESTHESIA:  Topical with tetracaine drops augmented with 1% preservative-free intracameral lidocaine.  ESTIMATED BLOOD LOSS: <1 mL   COMPLICATIONS:  None.   DESCRIPTION OF PROCEDURE:  The patient was identified in the holding room and transported to the operating room and placed in the supine position under the operating microscope.  The left eye was identified as the operative eye and it was prepped and draped in the usual sterile ophthalmic fashion.   A 1.0 millimeter clear-corneal paracentesis was made at the 5:00 position. 0.5 ml of preservative-free 1% lidocaine with epinephrine was injected into the anterior chamber.  The anterior chamber was filled with Healon 5 viscoelastic.  A 2.4 millimeter keratome was used to make a near-clear corneal incision at the 2:00 position.  A curvilinear capsulorrhexis was made with a cystotome and capsulorrhexis forceps.  Balanced salt solution was used to hydrodissect and hydrodelineate the nucleus.   Phacoemulsification was then used in stop and chop fashion to remove the lens nucleus and epinucleus.  The remaining cortex was then removed using the irrigation and aspiration handpiece. Healon was then placed into the capsular bag to distend it for lens placement.  A lens  was then injected into the capsular bag.  The remaining viscoelastic was aspirated.   Wounds were hydrated with balanced salt solution.  The anterior chamber was inflated to a physiologic pressure with balanced salt solution.  Intracameral vigamox 0.1 mL undiltued was injected into the eye and a drop placed onto the ocular surface.  No wound leaks were noted.  The patient was taken to the recovery room in stable condition without complications of anesthesia or surgery  Laiyla Slagel 04/25/2021, 9:08 AM

## 2021-04-25 NOTE — Anesthesia Preprocedure Evaluation (Signed)
Anesthesia Evaluation  Patient identified by MRN, date of birth, ID band Patient awake    History of Anesthesia Complications (+) PONV and history of anesthetic complications (PONV)  Airway Mallampati: II  TM Distance: >3 FB     Dental   Pulmonary asthma ,    Pulmonary exam normal        Cardiovascular hypertension,  Rhythm:Regular Rate:Normal     Neuro/Psych  Headaches, Anxiety    GI/Hepatic GERD  ,  Endo/Other  Hypothyroidism BMI > 30  Renal/GU      Musculoskeletal  (+) Arthritis ,   Abdominal   Peds  Hematology   Anesthesia Other Findings   Reproductive/Obstetrics                             Anesthesia Physical  Anesthesia Plan  ASA: 2  Anesthesia Plan: MAC   Post-op Pain Management:    Induction: Intravenous  PONV Risk Score and Plan: TIVA, Midazolam and Treatment may vary due to age or medical condition  Airway Management Planned: Natural Airway and Nasal Cannula  Additional Equipment:   Intra-op Plan:   Post-operative Plan:   Informed Consent: I have reviewed the patients History and Physical, chart, labs and discussed the procedure including the risks, benefits and alternatives for the proposed anesthesia with the patient or authorized representative who has indicated his/her understanding and acceptance.       Plan Discussed with: CRNA  Anesthesia Plan Comments:         Anesthesia Quick Evaluation

## 2021-04-25 NOTE — Anesthesia Postprocedure Evaluation (Signed)
Anesthesia Post Note  Patient: Cynthia Mahoney  Procedure(s) Performed: CATARACT EXTRACTION PHACO AND INTRAOCULAR LENS PLACEMENT (IOC) LEFT (Left: Eye)     Patient location during evaluation: PACU Anesthesia Type: MAC Level of consciousness: awake and alert Pain management: pain level controlled Vital Signs Assessment: post-procedure vital signs reviewed and stable Respiratory status: spontaneous breathing, nonlabored ventilation and respiratory function stable Cardiovascular status: stable and blood pressure returned to baseline Postop Assessment: no apparent nausea or vomiting Anesthetic complications: no   No notable events documented.  [name]

## 2021-04-25 NOTE — Transfer of Care (Signed)
Immediate Anesthesia Transfer of Care Note  Patient: Cynthia Mahoney  Procedure(s) Performed: CATARACT EXTRACTION PHACO AND INTRAOCULAR LENS PLACEMENT (IOC) LEFT (Left: Eye)  Patient Location: PACU  Anesthesia Type: MAC  Level of Consciousness: awake, alert  and patient cooperative  Airway and Oxygen Therapy: Patient Spontanous Breathing and Patient connected to supplemental oxygen  Post-op Assessment: Post-op Vital signs reviewed, Patient's Cardiovascular Status Stable, Respiratory Function Stable, Patent Airway and No signs of Nausea or vomiting  Post-op Vital Signs: Reviewed and stable  Complications: No notable events documented.

## 2021-04-26 ENCOUNTER — Encounter: Payer: Self-pay | Admitting: Ophthalmology

## 2021-04-27 DIAGNOSIS — R053 Chronic cough: Secondary | ICD-10-CM | POA: Diagnosis not present

## 2021-04-27 DIAGNOSIS — J4540 Moderate persistent asthma, uncomplicated: Secondary | ICD-10-CM | POA: Diagnosis not present

## 2021-06-09 DIAGNOSIS — Z961 Presence of intraocular lens: Secondary | ICD-10-CM | POA: Diagnosis not present

## 2021-06-11 DIAGNOSIS — N1831 Chronic kidney disease, stage 3a: Secondary | ICD-10-CM | POA: Diagnosis not present

## 2021-06-11 DIAGNOSIS — J4541 Moderate persistent asthma with (acute) exacerbation: Secondary | ICD-10-CM | POA: Diagnosis not present

## 2021-06-20 DIAGNOSIS — Z1389 Encounter for screening for other disorder: Secondary | ICD-10-CM | POA: Diagnosis not present

## 2021-06-20 DIAGNOSIS — Z1331 Encounter for screening for depression: Secondary | ICD-10-CM | POA: Diagnosis not present

## 2021-06-20 DIAGNOSIS — E7800 Pure hypercholesterolemia, unspecified: Secondary | ICD-10-CM | POA: Diagnosis not present

## 2021-06-20 DIAGNOSIS — E039 Hypothyroidism, unspecified: Secondary | ICD-10-CM | POA: Diagnosis not present

## 2021-06-20 DIAGNOSIS — J45909 Unspecified asthma, uncomplicated: Secondary | ICD-10-CM | POA: Diagnosis not present

## 2021-06-20 DIAGNOSIS — Z0000 Encounter for general adult medical examination without abnormal findings: Secondary | ICD-10-CM | POA: Diagnosis not present

## 2021-06-20 DIAGNOSIS — N1831 Chronic kidney disease, stage 3a: Secondary | ICD-10-CM | POA: Diagnosis not present

## 2021-06-23 DIAGNOSIS — M5136 Other intervertebral disc degeneration, lumbar region: Secondary | ICD-10-CM | POA: Diagnosis not present

## 2021-06-23 DIAGNOSIS — M5416 Radiculopathy, lumbar region: Secondary | ICD-10-CM | POA: Diagnosis not present

## 2021-06-23 DIAGNOSIS — M48062 Spinal stenosis, lumbar region with neurogenic claudication: Secondary | ICD-10-CM | POA: Diagnosis not present

## 2021-08-02 DIAGNOSIS — J4541 Moderate persistent asthma with (acute) exacerbation: Secondary | ICD-10-CM | POA: Diagnosis not present

## 2021-08-02 DIAGNOSIS — N1831 Chronic kidney disease, stage 3a: Secondary | ICD-10-CM | POA: Diagnosis not present

## 2021-08-18 DIAGNOSIS — M79641 Pain in right hand: Secondary | ICD-10-CM | POA: Diagnosis not present

## 2021-08-18 DIAGNOSIS — M7989 Other specified soft tissue disorders: Secondary | ICD-10-CM | POA: Diagnosis not present

## 2021-08-18 DIAGNOSIS — M19041 Primary osteoarthritis, right hand: Secondary | ICD-10-CM | POA: Diagnosis not present

## 2021-10-26 DIAGNOSIS — J4540 Moderate persistent asthma, uncomplicated: Secondary | ICD-10-CM | POA: Diagnosis not present

## 2021-11-21 DIAGNOSIS — M48062 Spinal stenosis, lumbar region with neurogenic claudication: Secondary | ICD-10-CM | POA: Diagnosis not present

## 2021-11-21 DIAGNOSIS — M47816 Spondylosis without myelopathy or radiculopathy, lumbar region: Secondary | ICD-10-CM | POA: Diagnosis not present

## 2021-11-21 DIAGNOSIS — M5416 Radiculopathy, lumbar region: Secondary | ICD-10-CM | POA: Diagnosis not present

## 2021-11-21 DIAGNOSIS — M5136 Other intervertebral disc degeneration, lumbar region: Secondary | ICD-10-CM | POA: Diagnosis not present

## 2021-12-13 DIAGNOSIS — E039 Hypothyroidism, unspecified: Secondary | ICD-10-CM | POA: Diagnosis not present

## 2021-12-13 DIAGNOSIS — N1831 Chronic kidney disease, stage 3a: Secondary | ICD-10-CM | POA: Diagnosis not present

## 2021-12-13 DIAGNOSIS — I10 Essential (primary) hypertension: Secondary | ICD-10-CM | POA: Diagnosis not present

## 2021-12-14 DIAGNOSIS — Z961 Presence of intraocular lens: Secondary | ICD-10-CM | POA: Diagnosis not present

## 2021-12-20 DIAGNOSIS — E039 Hypothyroidism, unspecified: Secondary | ICD-10-CM | POA: Diagnosis not present

## 2021-12-20 DIAGNOSIS — I129 Hypertensive chronic kidney disease with stage 1 through stage 4 chronic kidney disease, or unspecified chronic kidney disease: Secondary | ICD-10-CM | POA: Diagnosis not present

## 2021-12-20 DIAGNOSIS — E785 Hyperlipidemia, unspecified: Secondary | ICD-10-CM | POA: Diagnosis not present

## 2021-12-20 DIAGNOSIS — N189 Chronic kidney disease, unspecified: Secondary | ICD-10-CM | POA: Diagnosis not present

## 2021-12-27 DIAGNOSIS — N1831 Chronic kidney disease, stage 3a: Secondary | ICD-10-CM | POA: Diagnosis not present

## 2021-12-27 DIAGNOSIS — R600 Localized edema: Secondary | ICD-10-CM | POA: Diagnosis not present

## 2021-12-27 DIAGNOSIS — E7800 Pure hypercholesterolemia, unspecified: Secondary | ICD-10-CM | POA: Diagnosis not present

## 2021-12-27 DIAGNOSIS — I872 Venous insufficiency (chronic) (peripheral): Secondary | ICD-10-CM | POA: Diagnosis not present

## 2021-12-27 DIAGNOSIS — I129 Hypertensive chronic kidney disease with stage 1 through stage 4 chronic kidney disease, or unspecified chronic kidney disease: Secondary | ICD-10-CM | POA: Diagnosis not present

## 2022-01-01 DIAGNOSIS — J4540 Moderate persistent asthma, uncomplicated: Secondary | ICD-10-CM | POA: Diagnosis not present

## 2022-01-06 DIAGNOSIS — Z79899 Other long term (current) drug therapy: Secondary | ICD-10-CM | POA: Diagnosis not present

## 2022-01-06 DIAGNOSIS — M5136 Other intervertebral disc degeneration, lumbar region: Secondary | ICD-10-CM | POA: Diagnosis not present

## 2022-01-06 DIAGNOSIS — M5416 Radiculopathy, lumbar region: Secondary | ICD-10-CM | POA: Diagnosis not present

## 2022-01-20 DIAGNOSIS — H8111 Benign paroxysmal vertigo, right ear: Secondary | ICD-10-CM | POA: Diagnosis not present

## 2022-01-20 DIAGNOSIS — R42 Dizziness and giddiness: Secondary | ICD-10-CM | POA: Diagnosis not present

## 2022-01-26 DIAGNOSIS — H8111 Benign paroxysmal vertigo, right ear: Secondary | ICD-10-CM | POA: Diagnosis not present

## 2022-01-26 DIAGNOSIS — J4540 Moderate persistent asthma, uncomplicated: Secondary | ICD-10-CM | POA: Diagnosis not present

## 2022-02-01 DIAGNOSIS — J4540 Moderate persistent asthma, uncomplicated: Secondary | ICD-10-CM | POA: Diagnosis not present

## 2022-02-02 DIAGNOSIS — R42 Dizziness and giddiness: Secondary | ICD-10-CM | POA: Diagnosis not present

## 2022-02-19 IMAGING — US US CAROTID DUPLEX BILAT
1 series · 13 of 24 positions shown · non-contrast
Comparison: None.

CLINICAL DATA: Dizziness, hypertension, syncope, hyperlipidemia

EXAM:
BILATERAL CAROTID DUPLEX ULTRASOUND
TECHNIQUE: Gray scale imaging, color Doppler and duplex ultrasound were
performed of bilateral carotid and vertebral arteries in the neck.

[Series 1: us carotid duplex bilat · 0.06mm/px · 13 of 75 slices shown]
[im 1/75]
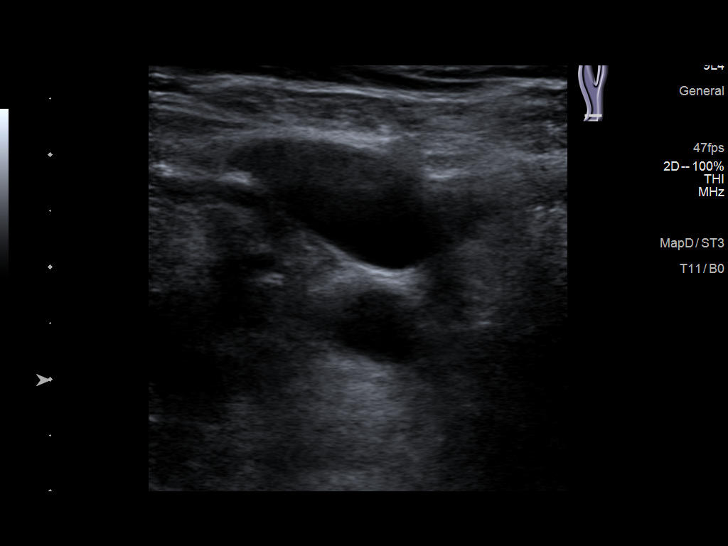
[im 7/75]
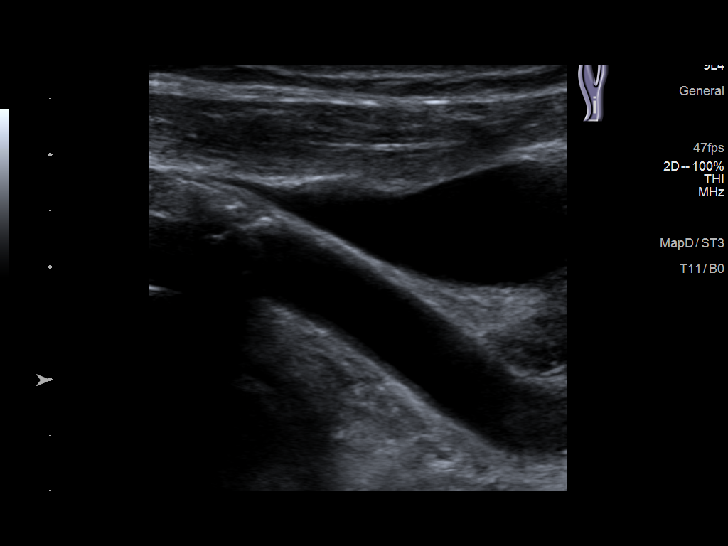
[im 13/75]
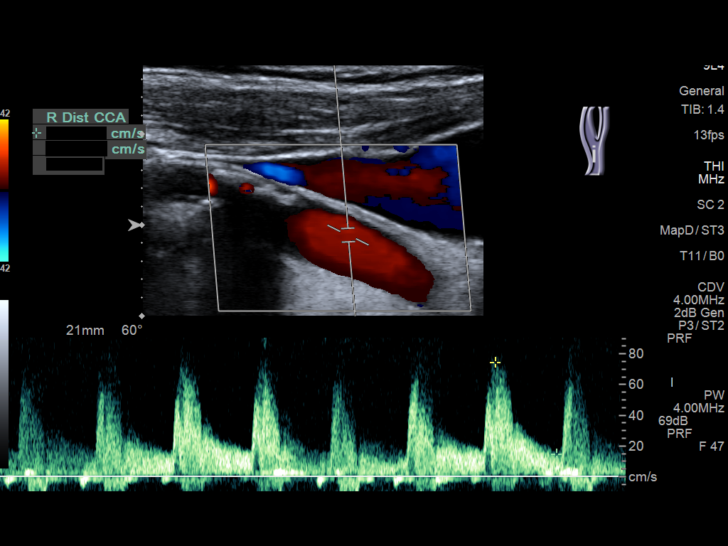
[im 20/75]
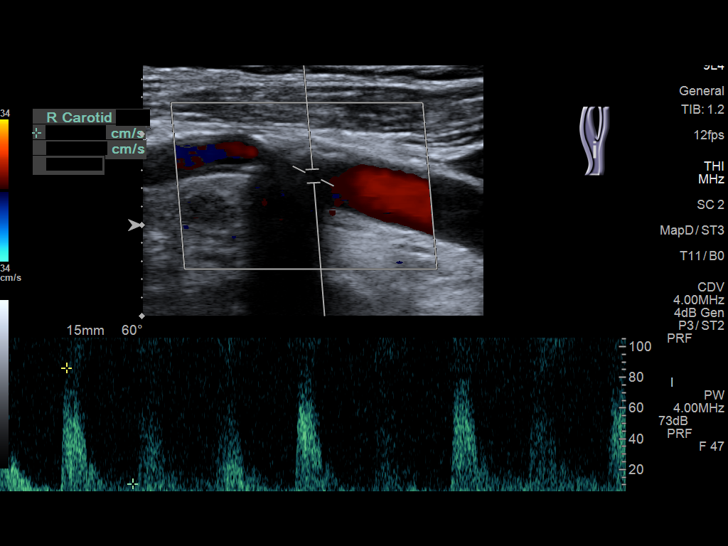
[im 26/75]
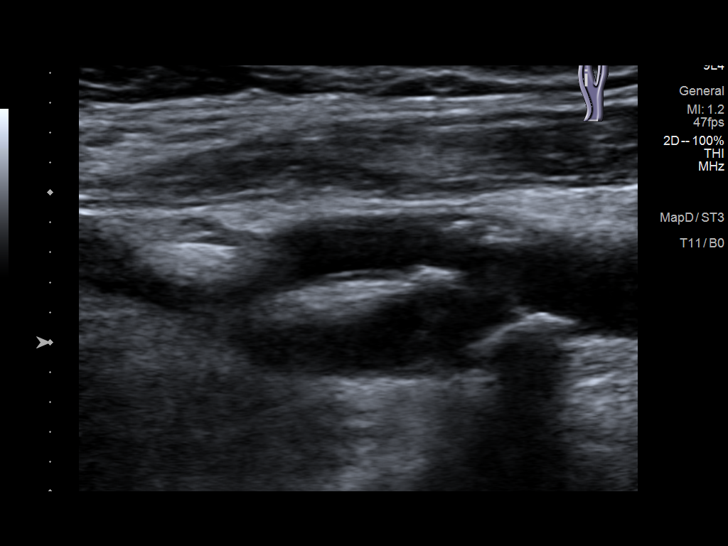
[im 33/75]
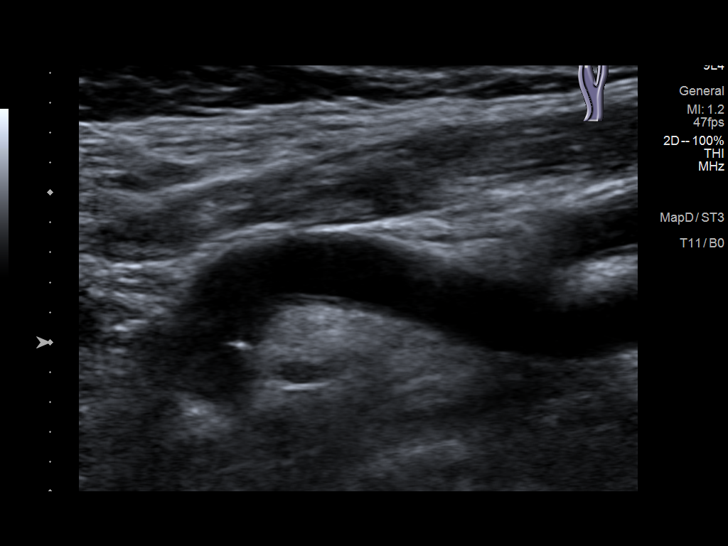
[im 39/75]
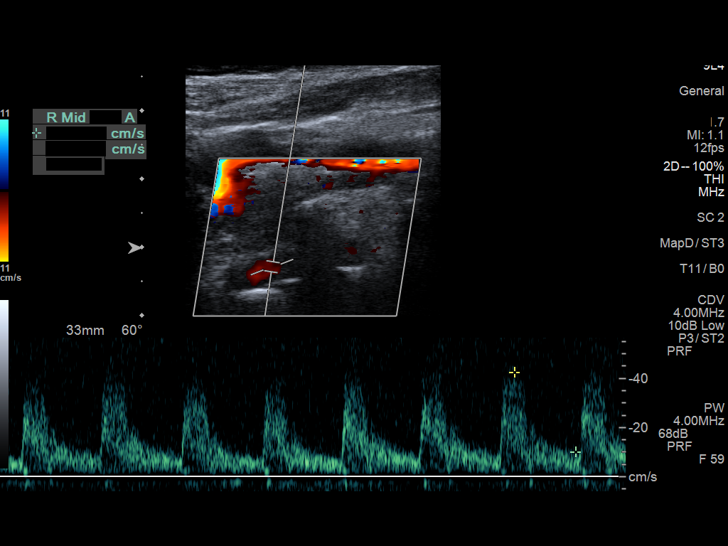
[im 42/75]
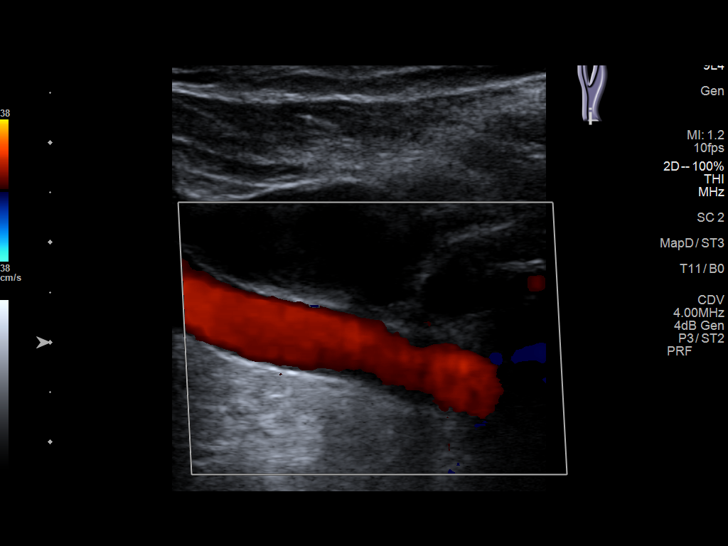
[im 49/75]
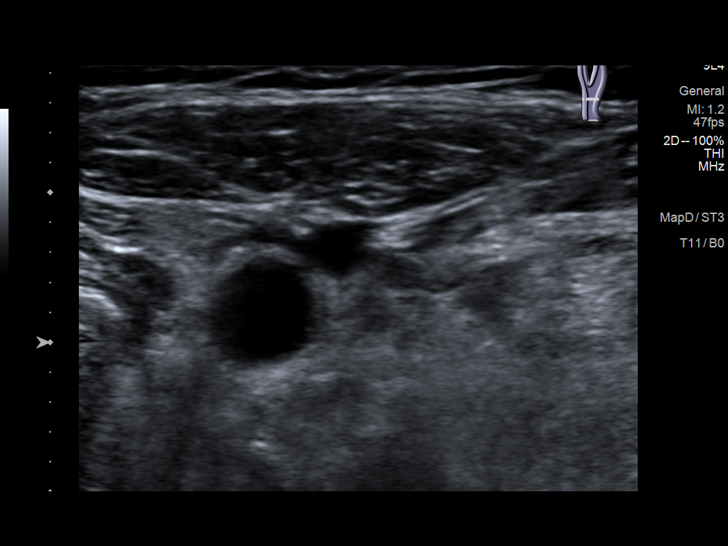
[im 55/75]
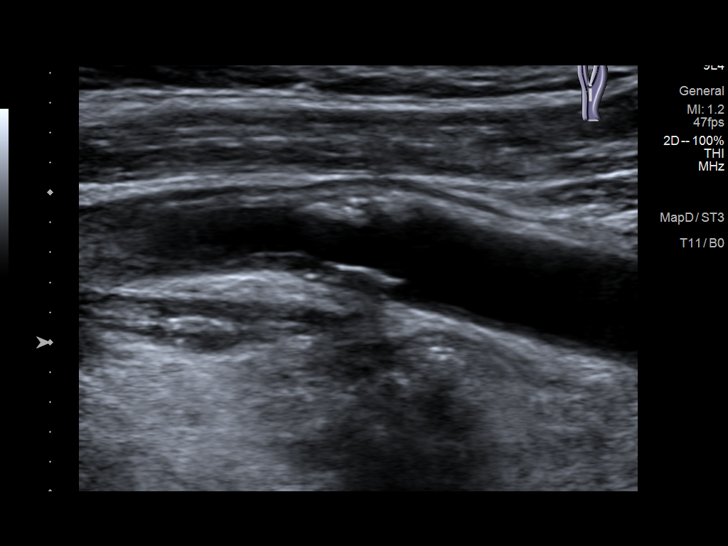
[im 62/75]
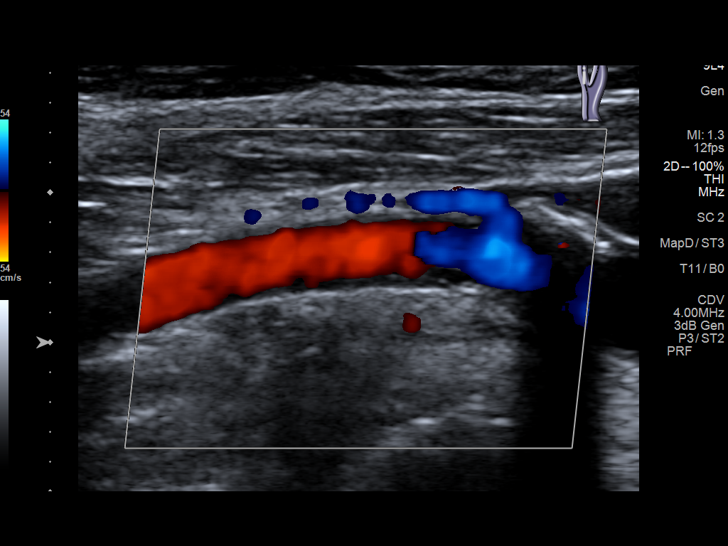
[im 68/75]
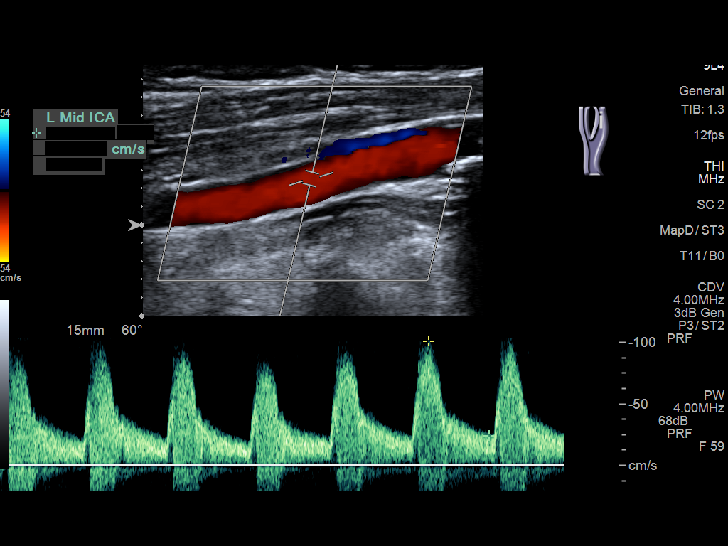
[im 75/75]
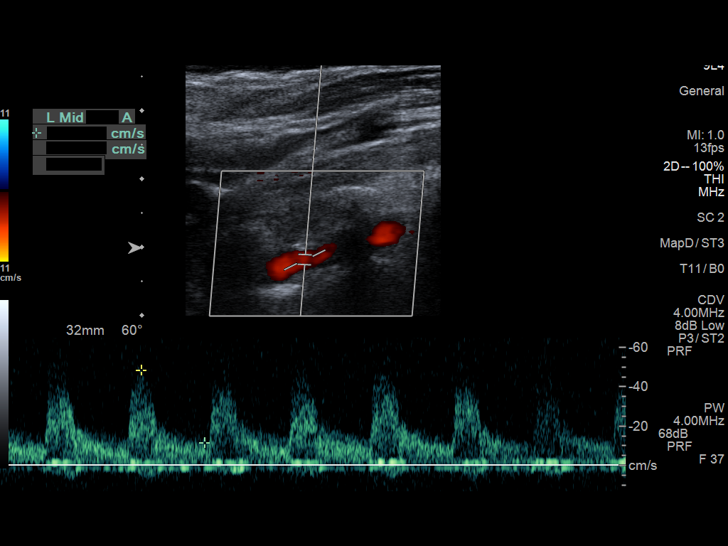

[13 of 24 positions shown; findings below may reference images not displayed]

FINDINGS: Criteria: Quantification of carotid stenosis is based on velocity
parameters that correlate the residual internal carotid diameter
with NASCET-based stenosis levels, using the diameter of the distal
internal carotid lumen as the denominator for stenosis measurement.

The following velocity measurements were obtained:

RIGHT

ICA: 116/22 cm/sec

CCA: 94/15 cm/sec

SYSTOLIC ICA/CCA RATIO:

ECA: 214 cm/sec

LEFT

ICA: 102/19 cm/sec

CCA: 89/16 cm/sec

SYSTOLIC ICA/CCA RATIO:

ECA: 111 cm/sec

RIGHT CAROTID ARTERY: Moderate intimal thickening, tortuosity, and
echogenic shadowing plaque formation. No hemodynamically significant
right ICA stenosis, velocity elevation, or turbulent flow. Degree of
narrowing less than 50%.

RIGHT VERTEBRAL ARTERY:  Normal antegrade flow

LEFT CAROTID ARTERY: Similar moderate intimal thickening, tortuosity
and echogenic plaque formation. No hemodynamically significant left
ICA stenosis, velocity elevation, or turbulent flow.

LEFT VERTEBRAL ARTERY:  Normal antegrade flow
IMPRESSION: Moderate bilateral carotid atherosclerosis. No significant ICA
stenosis. Degree of narrowing less than 50% bilaterally ultrasound
criteria.

Patent antegrade vertebral flow bilaterally

## 2022-02-20 DIAGNOSIS — M48062 Spinal stenosis, lumbar region with neurogenic claudication: Secondary | ICD-10-CM | POA: Diagnosis not present

## 2022-02-20 DIAGNOSIS — M5416 Radiculopathy, lumbar region: Secondary | ICD-10-CM | POA: Diagnosis not present

## 2022-02-20 DIAGNOSIS — Z79899 Other long term (current) drug therapy: Secondary | ICD-10-CM | POA: Diagnosis not present

## 2022-03-03 DIAGNOSIS — M67911 Unspecified disorder of synovium and tendon, right shoulder: Secondary | ICD-10-CM | POA: Diagnosis not present

## 2022-03-03 DIAGNOSIS — Z8739 Personal history of other diseases of the musculoskeletal system and connective tissue: Secondary | ICD-10-CM | POA: Diagnosis not present

## 2022-03-03 DIAGNOSIS — M25511 Pain in right shoulder: Secondary | ICD-10-CM | POA: Diagnosis not present

## 2022-03-03 DIAGNOSIS — S4991XA Unspecified injury of right shoulder and upper arm, initial encounter: Secondary | ICD-10-CM | POA: Diagnosis not present

## 2022-03-13 ENCOUNTER — Encounter (INDEPENDENT_AMBULATORY_CARE_PROVIDER_SITE_OTHER): Payer: Self-pay

## 2022-03-13 DIAGNOSIS — Z03818 Encounter for observation for suspected exposure to other biological agents ruled out: Secondary | ICD-10-CM | POA: Diagnosis not present

## 2022-03-15 DIAGNOSIS — J4540 Moderate persistent asthma, uncomplicated: Secondary | ICD-10-CM | POA: Diagnosis not present

## 2022-03-15 DIAGNOSIS — N1831 Chronic kidney disease, stage 3a: Secondary | ICD-10-CM | POA: Diagnosis not present

## 2022-03-15 DIAGNOSIS — U071 COVID-19: Secondary | ICD-10-CM | POA: Diagnosis not present

## 2022-03-27 DIAGNOSIS — N390 Urinary tract infection, site not specified: Secondary | ICD-10-CM | POA: Diagnosis not present

## 2022-03-27 DIAGNOSIS — R300 Dysuria: Secondary | ICD-10-CM | POA: Diagnosis not present

## 2022-04-10 ENCOUNTER — Encounter: Payer: Self-pay | Admitting: Emergency Medicine

## 2022-04-10 ENCOUNTER — Other Ambulatory Visit: Payer: Self-pay

## 2022-04-10 ENCOUNTER — Emergency Department: Payer: Medicare HMO

## 2022-04-10 ENCOUNTER — Emergency Department
Admission: EM | Admit: 2022-04-10 | Discharge: 2022-04-10 | Disposition: A | Discharge status: Home / Self Care | Payer: Medicare HMO | Attending: Emergency Medicine | Admitting: Emergency Medicine

## 2022-04-10 DIAGNOSIS — M25561 Pain in right knee: Secondary | ICD-10-CM | POA: Insufficient documentation

## 2022-04-10 DIAGNOSIS — Z96659 Presence of unspecified artificial knee joint: Secondary | ICD-10-CM | POA: Insufficient documentation

## 2022-04-10 DIAGNOSIS — Z471 Aftercare following joint replacement surgery: Secondary | ICD-10-CM | POA: Diagnosis not present

## 2022-04-10 DIAGNOSIS — E039 Hypothyroidism, unspecified: Secondary | ICD-10-CM | POA: Diagnosis not present

## 2022-04-10 DIAGNOSIS — I10 Essential (primary) hypertension: Secondary | ICD-10-CM | POA: Insufficient documentation

## 2022-04-10 DIAGNOSIS — Z96651 Presence of right artificial knee joint: Secondary | ICD-10-CM | POA: Diagnosis not present

## 2022-04-10 NOTE — ED Provider Notes (Signed)
[facility] Provider Note    Event Date/Time   First MD Initiated Contact with Patient 04/10/22 1045     (approximate)   History   Knee Injury   HPI   NAEEMAH Mahoney is a 87 y.o. female with a PMH of HTN, HLD who presents today for evaluation of knee pain.  Patient reports that she knelt on the couch and felt pain in her right knee.  She has been able to walk without difficulty.  She denies numbness or tingling.  She has not had any calf pain or leg swelling.  She wants to be sure that her knee replacement is okay.  Patient Active Problem List   Diagnosis Date Noted   Carotid stenosis 12/07/2020   Hyperlipidemia 12/07/2020   Hyponatremia 12/26/2019   Shortness of breath 12/26/2019   HTN (hypertension) 12/26/2019   Hypothyroidism 12/26/2019   Syncope 12/28/2016          Physical Exam   Triage Vital Signs: ED Triage Vitals [04/10/22 1042]  Enc Vitals Group     BP      Pulse      Resp      Temp      Temp src      SpO2      Weight      Height      Head Circumference      Peak Flow      Pain Score 8     Pain Loc      Pain Edu?      Excl. in [place]?     Most recent vital signs: Vitals:   04/10/22 1048  BP: (!) 157/85  Pulse: 63  Resp: 18  Temp: 98 F (36.7 C)  SpO2: 98%    Physical Exam Vitals and nursing note reviewed.  Constitutional:      General: Awake and alert. No acute distress.    Appearance: Normal appearance. The patient is normal weight.  HENT:     Head: Normocephalic and atraumatic.     Mouth: Mucous membranes are moist.  Eyes:     General: PERRL. Normal EOMs        Right eye: No discharge.        Left eye: No discharge.     Conjunctiva/sclera: Conjunctivae normal.  Cardiovascular:     Rate and Rhythm: Normal rate and regular rhythm.     Pulses: Normal pulses.     Heart sounds: Normal heart sounds Pulmonary:     Effort: Pulmonary effort is normal. No respiratory distress.     Breath sounds: Normal breath  sounds.  Abdominal:     Abdomen is soft. There is no abdominal tenderness. No rebound or guarding. No distention. Musculoskeletal:        General: No swelling. Normal range of motion.     Cervical back: Normal range of motion and neck supple.  Right knee: well healed surgical incision. No deformity, warmth, or rash. Mild inferio-medial joint line tenderness. No patellar tenderness, no ballotment Warm and well perfused extremity with 2+ pedal pulses 5/5 strength to dorsiflexion and plantarflexion at the ankle with intact sensation throughout extremity Normal range of motion of the knee, with intact flexion and extension to active and passive range of motion. Extensor mechanism intact. No ligamentous laxity. Negative anterior/posterior drawer/negative lachman, negative mcmurrays No effusion or warmth Intact quadriceps, hamstring function, patellar tendon function Pelvis stable Full ROM of ankle without pain or swelling Foot warm and well  perfused Skin:    General: Skin is warm and dry.     Capillary Refill: Capillary refill takes less than 2 seconds.     Findings: No rash.  Neurological:     Mental Status: The patient is awake and alert.      ED Results / Procedures / Treatments   Labs (all labs ordered are listed, but only abnormal results are displayed) Labs Reviewed - No data to display   EKG     RADIOLOGY I independently reviewed and interpreted imaging and agree with radiologists findings.     PROCEDURES:  Critical Care performed:   Procedures   MEDICATIONS ORDERED IN ED: Medications - No data to display   IMPRESSION / MDM / [place] / ED COURSE  I reviewed the triage vital signs and the nursing notes.   Differential diagnosis includes, but is not limited to, effusion, sprain, contusion, dislocation, fracture, joint infection, tendon rupture. No evidence of neurological deficit or vascular compromise on exam. No fracture/dislocation on X-Ray.  No  hardware complication on x-ray.  There is no warmth or erythema, patient has full and normal range of motion of her knee, no constitutional symptoms or effusion to suggest septic joint.  No deformity or obvious ligamentous laxity on exam. No history of immunosuppression. Overall well appearing, vital signs stable. No indication for diagnostic or therapeutic procedure such as arthrocentesis.  She was given an Ace bandage and instructed to wear during the day, remove it at night.  We also discussed rest, elevation, and cold compresses.  She is able to ambulate with a steady gait, unassisted.  Return precautions and care instructions discussed. Outpatient follow-up advised. Patient agrees with plan of care.   Patient's presentation is most consistent with acute complicated illness / injury requiring diagnostic workup.    FINAL CLINICAL IMPRESSION(S) / ED DIAGNOSES   Final diagnoses:  Acute pain of right knee     Rx / DC Orders   ED Discharge Orders     None        Note:  This document was prepared using Dragon voice recognition software and may include unintentional dictation errors.   Emeline Gins 04/10/22 1156    Lavonia Drafts, MD 04/10/22 1323

## 2022-04-10 NOTE — Discharge Instructions (Addendum)
Your xray was normal.  You may wear the Ace wrap during the day, take it off at night.  Rest, ice, elevate your leg.  Please return for any new, worsening, or change in symptoms or other concerns.  Please follow-up with orthopedics this week.  Is a pleasure caring for you today.

## 2022-04-10 NOTE — ED Triage Notes (Signed)
Patient to ED via POV for right knee injury. Hx of replacement in knee. Hurt knee Saturday when trying to get on bed. Ambulatory with cane. Took hydrocodone this AM- pain 8/10.

## 2022-04-19 ENCOUNTER — Telehealth: Payer: Self-pay

## 2022-04-19 NOTE — Telephone Encounter (Signed)
     Patient  visit on 11/27  at [place]   Have you been able to follow up with your primary care physician? Yes   The patient was or was not able to obtain any needed medicine or equipment. Yes   Are there diet recommendations that you are having difficulty following? Na   Patient expresses understanding of discharge instructions and education provided has no other needs at this time. Yes      [place], [facility], Care Management  [phone] [address], [place], [place] [address] Phone: [phone] Email: [email or web address]

## 2022-05-03 DIAGNOSIS — H40003 Preglaucoma, unspecified, bilateral: Secondary | ICD-10-CM | POA: Diagnosis not present

## 2022-05-03 DIAGNOSIS — Z0100 Encounter for examination of eyes and vision without abnormal findings: Secondary | ICD-10-CM | POA: Diagnosis not present

## 2022-07-26 DIAGNOSIS — K219 Gastro-esophageal reflux disease without esophagitis: Secondary | ICD-10-CM | POA: Diagnosis not present

## 2022-07-26 DIAGNOSIS — J4540 Moderate persistent asthma, uncomplicated: Secondary | ICD-10-CM | POA: Diagnosis not present

## 2022-08-07 DIAGNOSIS — R490 Dysphonia: Secondary | ICD-10-CM | POA: Diagnosis not present

## 2022-08-07 DIAGNOSIS — K219 Gastro-esophageal reflux disease without esophagitis: Secondary | ICD-10-CM | POA: Diagnosis not present

## 2022-10-23 DIAGNOSIS — M5136 Other intervertebral disc degeneration, lumbar region: Secondary | ICD-10-CM | POA: Diagnosis not present

## 2022-10-23 DIAGNOSIS — M5416 Radiculopathy, lumbar region: Secondary | ICD-10-CM | POA: Diagnosis not present

## 2022-10-23 DIAGNOSIS — M48062 Spinal stenosis, lumbar region with neurogenic claudication: Secondary | ICD-10-CM | POA: Diagnosis not present

## 2022-11-09 DIAGNOSIS — H402233 Chronic angle-closure glaucoma, bilateral, severe stage: Secondary | ICD-10-CM | POA: Diagnosis not present

## 2022-12-11 ENCOUNTER — Other Ambulatory Visit (INDEPENDENT_AMBULATORY_CARE_PROVIDER_SITE_OTHER): Payer: Self-pay | Admitting: Vascular Surgery

## 2022-12-11 DIAGNOSIS — I6523 Occlusion and stenosis of bilateral carotid arteries: Secondary | ICD-10-CM

## 2022-12-12 ENCOUNTER — Ambulatory Visit (INDEPENDENT_AMBULATORY_CARE_PROVIDER_SITE_OTHER): Payer: Medicare HMO | Admitting: Vascular Surgery

## 2022-12-12 ENCOUNTER — Encounter (INDEPENDENT_AMBULATORY_CARE_PROVIDER_SITE_OTHER): Payer: Medicare HMO

## 2022-12-12 DIAGNOSIS — E7800 Pure hypercholesterolemia, unspecified: Secondary | ICD-10-CM | POA: Diagnosis not present

## 2022-12-12 DIAGNOSIS — E039 Hypothyroidism, unspecified: Secondary | ICD-10-CM | POA: Diagnosis not present

## 2022-12-12 DIAGNOSIS — N1831 Chronic kidney disease, stage 3a: Secondary | ICD-10-CM | POA: Diagnosis not present

## 2022-12-13 ENCOUNTER — Encounter: Payer: Self-pay | Admitting: Dermatology

## 2022-12-13 ENCOUNTER — Ambulatory Visit: Payer: Medicare HMO | Admitting: Dermatology

## 2022-12-13 DIAGNOSIS — W908XXA Exposure to other nonionizing radiation, initial encounter: Secondary | ICD-10-CM | POA: Diagnosis not present

## 2022-12-13 DIAGNOSIS — L578 Other skin changes due to chronic exposure to nonionizing radiation: Secondary | ICD-10-CM | POA: Diagnosis not present

## 2022-12-13 DIAGNOSIS — L820 Inflamed seborrheic keratosis: Secondary | ICD-10-CM

## 2022-12-13 DIAGNOSIS — L821 Other seborrheic keratosis: Secondary | ICD-10-CM | POA: Diagnosis not present

## 2022-12-13 DIAGNOSIS — L570 Actinic keratosis: Secondary | ICD-10-CM | POA: Diagnosis not present

## 2022-12-13 DIAGNOSIS — L814 Other melanin hyperpigmentation: Secondary | ICD-10-CM | POA: Diagnosis not present

## 2022-12-13 NOTE — Progress Notes (Signed)
New Patient Visit   Subjective  Cynthia Mahoney is a 88 y.o. female who presents for the following: scaly spot behind left ear, present for at least a year. Bump at nose that gets scaly and peels, present for at least a year. Itchy scaly spot at mid chest, present for at least 1 year. Itchy spot at left arm that will not go away.   The patient has spots, moles and lesions to be evaluated, some may be new or changing and the patient may have concern these could be cancer.  No hx of skin cancer.   The following portions of the chart were reviewed this encounter and updated as appropriate: medications, allergies, medical history  Review of Systems:  No other skin or systemic complaints except as noted in HPI or Assessment and Plan.  Objective  Well appearing patient in no apparent distress; mood and affect are within normal limits.   A focused examination was performed of the following areas: Face, chest, arms, neck  Relevant exam findings are noted in the Assessment and Plan.  Nose x 2, left mandible x 1 (3) Erythematous thin papules/macules with gritty scale.   left forearm x 1, mid chest x 1 (2) Erythematous stuck-on, waxy papule or plaque       Assessment & Plan     AK (actinic keratosis) (3) Nose x 2, left mandible x 1  Actinic keratoses are precancerous spots that appear secondary to cumulative UV radiation exposure/sun exposure over time. They are chronic with expected duration over 1 year. A portion of actinic keratoses will progress to squamous cell carcinoma of the skin. It is not possible to reliably predict which spots will progress to skin cancer and so treatment is recommended to prevent development of skin cancer.  Recommend daily broad spectrum sunscreen SPF 30+ to sun-exposed areas, reapply every 2 hours as needed.  Recommend staying in the shade or wearing long sleeves, sun glasses (UVA+UVB protection) and wide brim hats (4-inch brim around the entire  circumference of the hat). Call for new or changing lesions.   Destruction of lesion - Nose x 2, left mandible x 1 (3) Complexity: simple   Destruction method: cryotherapy   Informed consent: discussed and consent obtained   Timeout:  patient name, date of birth, surgical site, and procedure verified Lesion destroyed using liquid nitrogen: Yes   Region frozen until ice ball extended beyond lesion: Yes   Outcome: patient tolerated procedure well with no complications   Post-procedure details: wound care instructions given    Inflamed seborrheic keratosis (2) left forearm x 1, mid chest x 1  Symptomatic, irritating, patient would like treated.  Benign-appearing.  Call clinic for new or changing lesions.    Destruction of lesion - left forearm x 1, mid chest x 1 (2) Complexity: simple   Destruction method: cryotherapy   Informed consent: discussed and consent obtained   Timeout:  patient name, date of birth, surgical site, and procedure verified Lesion destroyed using liquid nitrogen: Yes   Region frozen until ice ball extended beyond lesion: Yes   Outcome: patient tolerated procedure well with no complications   Post-procedure details: wound care instructions given    ACTINIC DAMAGE - chronic, secondary to cumulative UV radiation exposure/sun exposure over time - diffuse scaly erythematous macules with underlying dyspigmentation - Recommend daily broad spectrum sunscreen SPF 30+ to sun-exposed areas, reapply every 2 hours as needed.  - Recommend staying in the shade or wearing long sleeves, sun  glasses (UVA+UVB protection) and wide brim hats (4-inch brim around the entire circumference of the hat). - Call for new or changing lesions.  SEBORRHEIC KERATOSIS - Stuck-on, waxy, tan-brown papules and/or plaques  - Benign-appearing - Discussed benign etiology and prognosis. - Observe - Call for any changes  LENTIGINES Exam: scattered tan macules Due to sun exposure Treatment  Plan: Benign-appearing, observe. Recommend daily broad spectrum sunscreen SPF 30+ to sun-exposed areas, reapply every 2 hours as needed.  Call for any changes   Return in about 4 months (around 04/14/2023) for AK follow up, ISK follow up.  Anise Salvo, RMA, am acting as scribe for Armida Sans, MD .   Documentation: I have reviewed the above documentation for accuracy and completeness, and I agree with the above.  Armida Sans, MD

## 2022-12-13 NOTE — Patient Instructions (Signed)
Cryotherapy Aftercare  Wash gently with soap and water everyday.   Apply Vaseline and Band-Aid daily until healed.    Due to recent changes in healthcare laws, you may see results of your pathology and/or laboratory studies on MyChart before the doctors have had a chance to review them. We understand that in some cases there may be results that are confusing or concerning to you. Please understand that not all results are received at the same time and often the doctors may need to interpret multiple results in order to provide you with the best plan of care or course of treatment. Therefore, we ask that you please give [place] 2 business days to thoroughly review all your results before contacting the office for clarification. Should we see a critical lab result, you will be contacted sooner.   If You Need Anything After Your Visit  If you have any questions or concerns for your doctor, please call our main line at [phone] and press option 4 to reach your doctor's medical assistant. If no one answers, please leave a voicemail as directed and we will return your call as soon as possible. Messages left after 4 pm will be answered the following business day.   You may also send [place] a message via MyChart. We typically respond to MyChart messages within 1-2 business days.  For prescription refills, please ask your pharmacy to contact our office. Our fax number is [phone].  If you have an urgent issue when the clinic is closed that cannot wait until the next business day, you can page your doctor at the number below.    Please note that while we do our best to be available for urgent issues outside of office hours, we are not available 24/7.   If you have an urgent issue and are unable to reach [place], you may choose to seek medical care at your doctor's office, retail clinic, urgent care center, or emergency room.  If you have a medical emergency, please immediately call 911 or go to the emergency  department.  Pager Numbers  - Dr. Gwen Pounds: [phone]  - Dr. Roseanne Reno: [phone]  In the event of inclement weather, please call our main line at [phone] for an update on the status of any delays or closures.  Dermatology Medication Tips: Please keep the boxes that topical medications come in in order to help keep track of the instructions about where and how to use these. Pharmacies typically print the medication instructions only on the boxes and not directly on the medication tubes.   If your medication is too expensive, please contact our office at [phone] option 4 or send [place] a message through MyChart.   We are unable to tell what your co-pay for medications will be in advance as this is different depending on your insurance coverage. However, we may be able to find a substitute medication at lower cost or fill out paperwork to get insurance to cover a needed medication.   If a prior authorization is required to get your medication covered by your insurance company, please allow [place] 1-2 business days to complete this process.  Drug prices often vary depending on where the prescription is filled and some pharmacies may offer cheaper prices.  The website www.goodrx.com contains coupons for medications through different pharmacies. The prices here do not account for what the cost may be with help from insurance (it may be cheaper with your insurance), but the website can give you the price if you  did not use any insurance.  - You can print the associated coupon and take it with your prescription to the pharmacy.  - You may also stop by our office during regular business hours and pick up a GoodRx coupon card.  - If you need your prescription sent electronically to a different pharmacy, notify our office through [facility] or by phone at [phone] option 4.

## 2022-12-18 ENCOUNTER — Encounter: Payer: Self-pay | Admitting: Dermatology

## 2022-12-19 DIAGNOSIS — N189 Chronic kidney disease, unspecified: Secondary | ICD-10-CM | POA: Diagnosis not present

## 2022-12-19 DIAGNOSIS — Z79899 Other long term (current) drug therapy: Secondary | ICD-10-CM | POA: Diagnosis not present

## 2022-12-19 DIAGNOSIS — I129 Hypertensive chronic kidney disease with stage 1 through stage 4 chronic kidney disease, or unspecified chronic kidney disease: Secondary | ICD-10-CM | POA: Diagnosis not present

## 2022-12-19 DIAGNOSIS — E785 Hyperlipidemia, unspecified: Secondary | ICD-10-CM | POA: Diagnosis not present

## 2022-12-19 DIAGNOSIS — E039 Hypothyroidism, unspecified: Secondary | ICD-10-CM | POA: Diagnosis not present

## 2023-01-08 DIAGNOSIS — J4540 Moderate persistent asthma, uncomplicated: Secondary | ICD-10-CM | POA: Diagnosis not present

## 2023-01-10 DIAGNOSIS — I700 Atherosclerosis of aorta: Secondary | ICD-10-CM | POA: Diagnosis not present

## 2023-01-10 DIAGNOSIS — M4185 Other forms of scoliosis, thoracolumbar region: Secondary | ICD-10-CM | POA: Diagnosis not present

## 2023-01-10 DIAGNOSIS — M25571 Pain in right ankle and joints of right foot: Secondary | ICD-10-CM | POA: Diagnosis not present

## 2023-01-10 DIAGNOSIS — M7989 Other specified soft tissue disorders: Secondary | ICD-10-CM | POA: Diagnosis not present

## 2023-01-10 DIAGNOSIS — M79604 Pain in right leg: Secondary | ICD-10-CM | POA: Diagnosis not present

## 2023-01-10 DIAGNOSIS — M5136 Other intervertebral disc degeneration, lumbar region: Secondary | ICD-10-CM | POA: Diagnosis not present

## 2023-01-10 DIAGNOSIS — S82831A Other fracture of upper and lower end of right fibula, initial encounter for closed fracture: Secondary | ICD-10-CM | POA: Diagnosis not present

## 2023-01-10 DIAGNOSIS — M85851 Other specified disorders of bone density and structure, right thigh: Secondary | ICD-10-CM | POA: Diagnosis not present

## 2023-01-10 DIAGNOSIS — M47816 Spondylosis without myelopathy or radiculopathy, lumbar region: Secondary | ICD-10-CM | POA: Diagnosis not present

## 2023-01-10 DIAGNOSIS — M85871 Other specified disorders of bone density and structure, right ankle and foot: Secondary | ICD-10-CM | POA: Diagnosis not present

## 2023-01-31 DIAGNOSIS — M5432 Sciatica, left side: Secondary | ICD-10-CM | POA: Diagnosis not present

## 2023-01-31 DIAGNOSIS — M9905 Segmental and somatic dysfunction of pelvic region: Secondary | ICD-10-CM | POA: Diagnosis not present

## 2023-01-31 DIAGNOSIS — M4306 Spondylolysis, lumbar region: Secondary | ICD-10-CM | POA: Diagnosis not present

## 2023-01-31 DIAGNOSIS — M9903 Segmental and somatic dysfunction of lumbar region: Secondary | ICD-10-CM | POA: Diagnosis not present

## 2023-02-06 DIAGNOSIS — M5432 Sciatica, left side: Secondary | ICD-10-CM | POA: Diagnosis not present

## 2023-02-06 DIAGNOSIS — M9905 Segmental and somatic dysfunction of pelvic region: Secondary | ICD-10-CM | POA: Diagnosis not present

## 2023-02-06 DIAGNOSIS — M9903 Segmental and somatic dysfunction of lumbar region: Secondary | ICD-10-CM | POA: Diagnosis not present

## 2023-02-06 DIAGNOSIS — M4306 Spondylolysis, lumbar region: Secondary | ICD-10-CM | POA: Diagnosis not present

## 2023-02-07 DIAGNOSIS — M9905 Segmental and somatic dysfunction of pelvic region: Secondary | ICD-10-CM | POA: Diagnosis not present

## 2023-02-07 DIAGNOSIS — M5432 Sciatica, left side: Secondary | ICD-10-CM | POA: Diagnosis not present

## 2023-02-07 DIAGNOSIS — M9903 Segmental and somatic dysfunction of lumbar region: Secondary | ICD-10-CM | POA: Diagnosis not present

## 2023-02-07 DIAGNOSIS — M4306 Spondylolysis, lumbar region: Secondary | ICD-10-CM | POA: Diagnosis not present

## 2023-02-12 DIAGNOSIS — M5432 Sciatica, left side: Secondary | ICD-10-CM | POA: Diagnosis not present

## 2023-02-12 DIAGNOSIS — M4306 Spondylolysis, lumbar region: Secondary | ICD-10-CM | POA: Diagnosis not present

## 2023-02-12 DIAGNOSIS — M9905 Segmental and somatic dysfunction of pelvic region: Secondary | ICD-10-CM | POA: Diagnosis not present

## 2023-02-12 DIAGNOSIS — M9903 Segmental and somatic dysfunction of lumbar region: Secondary | ICD-10-CM | POA: Diagnosis not present

## 2023-04-19 DIAGNOSIS — H402233 Chronic angle-closure glaucoma, bilateral, severe stage: Secondary | ICD-10-CM | POA: Diagnosis not present

## 2023-04-24 ENCOUNTER — Ambulatory Visit: Payer: Medicare HMO | Admitting: Dermatology

## 2023-05-24 DIAGNOSIS — Z9889 Other specified postprocedural states: Secondary | ICD-10-CM | POA: Diagnosis not present

## 2023-05-24 DIAGNOSIS — H26493 Other secondary cataract, bilateral: Secondary | ICD-10-CM | POA: Diagnosis not present

## 2023-05-24 DIAGNOSIS — H401133 Primary open-angle glaucoma, bilateral, severe stage: Secondary | ICD-10-CM | POA: Diagnosis not present

## 2023-05-24 DIAGNOSIS — Z961 Presence of intraocular lens: Secondary | ICD-10-CM | POA: Diagnosis not present

## 2023-06-05 DIAGNOSIS — M19011 Primary osteoarthritis, right shoulder: Secondary | ICD-10-CM | POA: Diagnosis not present

## 2023-06-11 DIAGNOSIS — R052 Subacute cough: Secondary | ICD-10-CM | POA: Diagnosis not present

## 2023-06-11 DIAGNOSIS — J410 Simple chronic bronchitis: Secondary | ICD-10-CM | POA: Diagnosis not present

## 2023-06-14 DIAGNOSIS — M19012 Primary osteoarthritis, left shoulder: Secondary | ICD-10-CM | POA: Diagnosis not present

## 2023-07-02 DIAGNOSIS — Z0000 Encounter for general adult medical examination without abnormal findings: Secondary | ICD-10-CM | POA: Diagnosis not present

## 2023-07-02 DIAGNOSIS — M48062 Spinal stenosis, lumbar region with neurogenic claudication: Secondary | ICD-10-CM | POA: Diagnosis not present

## 2023-07-02 DIAGNOSIS — N1831 Chronic kidney disease, stage 3a: Secondary | ICD-10-CM | POA: Diagnosis not present

## 2023-07-02 DIAGNOSIS — Z79899 Other long term (current) drug therapy: Secondary | ICD-10-CM | POA: Diagnosis not present

## 2023-07-02 DIAGNOSIS — J410 Simple chronic bronchitis: Secondary | ICD-10-CM | POA: Diagnosis not present

## 2023-07-02 DIAGNOSIS — E7800 Pure hypercholesterolemia, unspecified: Secondary | ICD-10-CM | POA: Diagnosis not present

## 2023-07-02 DIAGNOSIS — I10 Essential (primary) hypertension: Secondary | ICD-10-CM | POA: Diagnosis not present

## 2023-07-02 DIAGNOSIS — M25511 Pain in right shoulder: Secondary | ICD-10-CM | POA: Diagnosis not present

## 2023-07-02 DIAGNOSIS — M5416 Radiculopathy, lumbar region: Secondary | ICD-10-CM | POA: Diagnosis not present

## 2023-07-02 DIAGNOSIS — M25512 Pain in left shoulder: Secondary | ICD-10-CM | POA: Diagnosis not present

## 2023-07-02 DIAGNOSIS — E039 Hypothyroidism, unspecified: Secondary | ICD-10-CM | POA: Diagnosis not present

## 2023-07-06 DIAGNOSIS — H26493 Other secondary cataract, bilateral: Secondary | ICD-10-CM | POA: Diagnosis not present

## 2023-07-06 DIAGNOSIS — H401133 Primary open-angle glaucoma, bilateral, severe stage: Secondary | ICD-10-CM | POA: Diagnosis not present

## 2023-07-06 DIAGNOSIS — Z961 Presence of intraocular lens: Secondary | ICD-10-CM | POA: Diagnosis not present

## 2023-07-11 DIAGNOSIS — M19012 Primary osteoarthritis, left shoulder: Secondary | ICD-10-CM | POA: Diagnosis not present

## 2023-08-01 DIAGNOSIS — M19012 Primary osteoarthritis, left shoulder: Secondary | ICD-10-CM | POA: Diagnosis not present

## 2023-09-11 DIAGNOSIS — F1190 Opioid use, unspecified, uncomplicated: Secondary | ICD-10-CM | POA: Diagnosis not present

## 2023-09-11 DIAGNOSIS — E039 Hypothyroidism, unspecified: Secondary | ICD-10-CM | POA: Diagnosis not present

## 2023-09-11 DIAGNOSIS — J45909 Unspecified asthma, uncomplicated: Secondary | ICD-10-CM | POA: Diagnosis not present

## 2023-09-11 DIAGNOSIS — E785 Hyperlipidemia, unspecified: Secondary | ICD-10-CM | POA: Diagnosis not present

## 2023-09-11 DIAGNOSIS — H401133 Primary open-angle glaucoma, bilateral, severe stage: Secondary | ICD-10-CM | POA: Diagnosis not present

## 2023-09-11 DIAGNOSIS — N1831 Chronic kidney disease, stage 3a: Secondary | ICD-10-CM | POA: Diagnosis not present

## 2023-09-11 DIAGNOSIS — G8929 Other chronic pain: Secondary | ICD-10-CM | POA: Diagnosis not present

## 2023-09-11 DIAGNOSIS — E261 Secondary hyperaldosteronism: Secondary | ICD-10-CM | POA: Diagnosis not present

## 2023-09-11 DIAGNOSIS — I509 Heart failure, unspecified: Secondary | ICD-10-CM | POA: Diagnosis not present

## 2023-09-11 DIAGNOSIS — I130 Hypertensive heart and chronic kidney disease with heart failure and stage 1 through stage 4 chronic kidney disease, or unspecified chronic kidney disease: Secondary | ICD-10-CM | POA: Diagnosis not present

## 2023-09-11 DIAGNOSIS — J309 Allergic rhinitis, unspecified: Secondary | ICD-10-CM | POA: Diagnosis not present

## 2023-09-28 DIAGNOSIS — M19012 Primary osteoarthritis, left shoulder: Secondary | ICD-10-CM | POA: Diagnosis not present

## 2023-10-02 ENCOUNTER — Encounter (INDEPENDENT_AMBULATORY_CARE_PROVIDER_SITE_OTHER): Payer: Self-pay

## 2023-10-10 DIAGNOSIS — Z1331 Encounter for screening for depression: Secondary | ICD-10-CM | POA: Diagnosis not present

## 2023-10-10 DIAGNOSIS — K219 Gastro-esophageal reflux disease without esophagitis: Secondary | ICD-10-CM | POA: Diagnosis not present

## 2023-10-10 DIAGNOSIS — J4540 Moderate persistent asthma, uncomplicated: Secondary | ICD-10-CM | POA: Diagnosis not present

## 2023-10-10 DIAGNOSIS — E669 Obesity, unspecified: Secondary | ICD-10-CM | POA: Diagnosis not present

## 2023-10-10 DIAGNOSIS — M5442 Lumbago with sciatica, left side: Secondary | ICD-10-CM | POA: Diagnosis not present

## 2023-10-10 DIAGNOSIS — G8929 Other chronic pain: Secondary | ICD-10-CM | POA: Diagnosis not present

## 2023-10-10 DIAGNOSIS — M5441 Lumbago with sciatica, right side: Secondary | ICD-10-CM | POA: Diagnosis not present

## 2023-11-05 DIAGNOSIS — M5416 Radiculopathy, lumbar region: Secondary | ICD-10-CM | POA: Diagnosis not present

## 2023-11-05 DIAGNOSIS — M48062 Spinal stenosis, lumbar region with neurogenic claudication: Secondary | ICD-10-CM | POA: Diagnosis not present

## 2023-11-05 DIAGNOSIS — Z79899 Other long term (current) drug therapy: Secondary | ICD-10-CM | POA: Diagnosis not present

## 2023-12-03 DIAGNOSIS — M47816 Spondylosis without myelopathy or radiculopathy, lumbar region: Secondary | ICD-10-CM | POA: Diagnosis not present

## 2023-12-03 DIAGNOSIS — M48062 Spinal stenosis, lumbar region with neurogenic claudication: Secondary | ICD-10-CM | POA: Diagnosis not present

## 2023-12-03 DIAGNOSIS — M5416 Radiculopathy, lumbar region: Secondary | ICD-10-CM | POA: Diagnosis not present

## 2023-12-19 DIAGNOSIS — M47816 Spondylosis without myelopathy or radiculopathy, lumbar region: Secondary | ICD-10-CM | POA: Diagnosis not present

## 2023-12-26 DIAGNOSIS — Z9889 Other specified postprocedural states: Secondary | ICD-10-CM | POA: Diagnosis not present

## 2023-12-26 DIAGNOSIS — Z961 Presence of intraocular lens: Secondary | ICD-10-CM | POA: Diagnosis not present

## 2023-12-26 DIAGNOSIS — R350 Frequency of micturition: Secondary | ICD-10-CM | POA: Diagnosis not present

## 2023-12-26 DIAGNOSIS — H401133 Primary open-angle glaucoma, bilateral, severe stage: Secondary | ICD-10-CM | POA: Diagnosis not present

## 2023-12-26 DIAGNOSIS — N390 Urinary tract infection, site not specified: Secondary | ICD-10-CM | POA: Diagnosis not present

## 2023-12-31 DIAGNOSIS — I10 Essential (primary) hypertension: Secondary | ICD-10-CM | POA: Diagnosis not present

## 2023-12-31 DIAGNOSIS — E7800 Pure hypercholesterolemia, unspecified: Secondary | ICD-10-CM | POA: Diagnosis not present

## 2023-12-31 DIAGNOSIS — J410 Simple chronic bronchitis: Secondary | ICD-10-CM | POA: Diagnosis not present

## 2023-12-31 DIAGNOSIS — E039 Hypothyroidism, unspecified: Secondary | ICD-10-CM | POA: Diagnosis not present

## 2023-12-31 DIAGNOSIS — Z79899 Other long term (current) drug therapy: Secondary | ICD-10-CM | POA: Diagnosis not present

## 2023-12-31 DIAGNOSIS — N1831 Chronic kidney disease, stage 3a: Secondary | ICD-10-CM | POA: Diagnosis not present

## 2024-01-03 DIAGNOSIS — M47816 Spondylosis without myelopathy or radiculopathy, lumbar region: Secondary | ICD-10-CM | POA: Diagnosis not present

## 2024-01-30 DIAGNOSIS — M47816 Spondylosis without myelopathy or radiculopathy, lumbar region: Secondary | ICD-10-CM | POA: Diagnosis not present

## 2024-03-06 DIAGNOSIS — Z79899 Other long term (current) drug therapy: Secondary | ICD-10-CM | POA: Diagnosis not present

## 2024-03-06 DIAGNOSIS — M5416 Radiculopathy, lumbar region: Secondary | ICD-10-CM | POA: Diagnosis not present

## 2024-03-06 DIAGNOSIS — M48062 Spinal stenosis, lumbar region with neurogenic claudication: Secondary | ICD-10-CM | POA: Diagnosis not present

## 2024-03-06 DIAGNOSIS — M47816 Spondylosis without myelopathy or radiculopathy, lumbar region: Secondary | ICD-10-CM | POA: Diagnosis not present

## 2024-04-02 DIAGNOSIS — R0689 Other abnormalities of breathing: Secondary | ICD-10-CM | POA: Diagnosis not present

## 2024-04-02 DIAGNOSIS — R0600 Dyspnea, unspecified: Secondary | ICD-10-CM | POA: Diagnosis not present

## 2024-04-02 DIAGNOSIS — J4540 Moderate persistent asthma, uncomplicated: Secondary | ICD-10-CM | POA: Diagnosis not present

## 2024-04-16 DIAGNOSIS — M48062 Spinal stenosis, lumbar region with neurogenic claudication: Secondary | ICD-10-CM | POA: Diagnosis not present

## 2024-04-16 DIAGNOSIS — Z79899 Other long term (current) drug therapy: Secondary | ICD-10-CM | POA: Diagnosis not present

## 2024-04-16 DIAGNOSIS — M5416 Radiculopathy, lumbar region: Secondary | ICD-10-CM | POA: Diagnosis not present

## 2024-04-16 DIAGNOSIS — M25531 Pain in right wrist: Secondary | ICD-10-CM | POA: Diagnosis not present

## 2024-04-23 ENCOUNTER — Emergency Department

## 2024-04-23 ENCOUNTER — Emergency Department
Admission: EM | Admit: 2024-04-23 | Discharge: 2024-04-23 | Disposition: A | Discharge status: Home / Self Care | Attending: Emergency Medicine | Admitting: Emergency Medicine

## 2024-04-23 DIAGNOSIS — S12390A Other displaced fracture of fourth cervical vertebra, initial encounter for closed fracture: Secondary | ICD-10-CM | POA: Insufficient documentation

## 2024-04-23 DIAGNOSIS — R519 Headache, unspecified: Secondary | ICD-10-CM | POA: Diagnosis not present

## 2024-04-23 DIAGNOSIS — S01112A Laceration without foreign body of left eyelid and periocular area, initial encounter: Secondary | ICD-10-CM | POA: Diagnosis not present

## 2024-04-23 DIAGNOSIS — W19XXXA Unspecified fall, initial encounter: Secondary | ICD-10-CM

## 2024-04-23 DIAGNOSIS — E039 Hypothyroidism, unspecified: Secondary | ICD-10-CM | POA: Diagnosis not present

## 2024-04-23 DIAGNOSIS — I10 Essential (primary) hypertension: Secondary | ICD-10-CM | POA: Insufficient documentation

## 2024-04-23 DIAGNOSIS — S61412A Laceration without foreign body of left hand, initial encounter: Secondary | ICD-10-CM | POA: Diagnosis not present

## 2024-04-23 DIAGNOSIS — S199XXA Unspecified injury of neck, initial encounter: Secondary | ICD-10-CM | POA: Diagnosis present

## 2024-04-23 DIAGNOSIS — W1809XA Striking against other object with subsequent fall, initial encounter: Secondary | ICD-10-CM | POA: Insufficient documentation

## 2024-04-23 MED ORDER — OXYCODONE HCL 5 MG PO TABS: 5.0000 mg | ORAL_TABLET | ORAL | 0 refills | Status: DC | PRN

## 2024-04-23 MED ORDER — LIDOCAINE HCL (PF) 1 % IJ SOLN
5.0000 mL | Freq: Once | INTRAMUSCULAR | Status: AC
  Administered 2024-04-23: 5 mL via INTRADERMAL
  Filled 2024-04-23: qty 5

## 2024-04-23 MED ORDER — BACITRACIN ZINC 500 UNIT/GM EX OINT
TOPICAL_OINTMENT | Freq: Once | CUTANEOUS | Status: AC
  Administered 2024-04-23: 1 via TOPICAL
  Filled 2024-04-23: qty 0.9

## 2024-04-23 NOTE — ED Triage Notes (Signed)
 Pt presents to the ED via POV from home mechnical after fall. Pt denies LOC. Denies use of blood thinners. Reports that she did hit her head. Pt has a bandage in place on head. Swelling noted to nose and brusing to left eye. Small laceration noted to left hand. Bleeding controlled. Pt A&Ox4.

## 2024-04-23 NOTE — ED Provider Notes (Signed)
 [facility] Provider Note   Event Date/Time   First MD Initiated Contact with Patient 04/23/24 1604     (approximate) History  Fall  HPI Cynthia Mahoney is a 89 y.o. female with a past medical history of hypertension, hypothyroidism, and hyperlipidemia who presents after mechanical fall from family which she states she hit her face onto a concrete driveway.  Patient denies any blood thinner use.  Patient endorses bleeding to the left eyebrow as well as the left hand that EMS bandaged immediately.  Patient denies any loss of consciousness.  Patient denies any subsequent loss of consciousness, lightheadedness, or nausea/vomiting.  Patient states she is up-to-date on her tetanus vaccination ROS: Patient currently denies any vision changes, tinnitus, difficulty speaking, facial droop, sore throat, chest pain, shortness of breath, abdominal pain, nausea/vomiting/diarrhea, dysuria, or weakness/numbness/paresthesias in any extremity   Physical Exam  Triage Vital Signs: ED Triage Vitals [04/23/24 1438]  Encounter Vitals Group     BP (!) 129/105     Girls Systolic BP Percentile      Girls Diastolic BP Percentile      Boys Systolic BP Percentile      Boys Diastolic BP Percentile      Pulse Rate 74     Resp 18     Temp 98.6 F (37 C)     Temp Source Oral     SpO2 93 %     Weight 176 lb (79.8 kg)     Height 5' 1 (1.549 m)     Head Circumference      Peak Flow      Pain Score 0     Pain Loc      Pain Education      Exclude from Growth Chart    Most recent vital signs: Vitals:   04/23/24 1438 04/23/24 1852  BP: (!) 129/105 (!) 195/88  Pulse: 74 85  Resp: 18 17  Temp: 98.6 F (37 C)   SpO2: 93% 96%   General: Awake, oriented x4. CV:  Good peripheral perfusion. Resp:  Normal effort. Abd:  No distention. Other:  Elderly obese Caucasian female resting comfortably in no acute distress.  There is a 3.5 cm curvilinear laceration to the left eyebrow and a 2 cm  vertical superficial skin tear to the dorsum of the left hand in between the middle and ring MCP joints ED Results / Procedures / Treatments  RADIOLOGY ED MD interpretation: CT of the cervical spine shows a small acute minimally displaced fracture through an anterior osteophyte off the inferior margin of the C4 vertebral body CT of the head without contrast interpreted by me shows no evidence of acute abnormalities including no intracerebral hemorrhage, obvious masses, or significant edema CT of the maxillofacial structures shows no evidence of acute abnormalities - All radiology independently interpreted and agree with radiology assessment Official radiology report(s): CT Cervical Spine Wo Contrast Result Date: 04/23/2024 CLINICAL DATA:  Clemens, hit head, nasal swelling EXAM: CT CERVICAL SPINE WITHOUT CONTRAST TECHNIQUE: Multidetector CT imaging of the cervical spine was performed without intravenous contrast. Multiplanar CT image reconstructions were also generated. RADIATION DOSE REDUCTION: This exam was performed according to the departmental dose-optimization program which includes automated exposure control, adjustment of the mA and/or kV according to patient size and/or use of iterative reconstruction technique. COMPARISON:  None Available. FINDINGS: Alignment: Alignment is grossly anatomic. Skull base and vertebrae: There is a cortical discontinuity through the anterior osteophyte off the inferior margin of the  C4 vertebral body, reference image 33/7, which could reflect an acute minimally displaced fracture. No other acute displaced fractures. No destructive bony abnormalities. Soft tissues and spinal canal: No prevertebral fluid or swelling. No visible canal hematoma. Disc levels: There is diffuse spondylosis and facet hypertrophy throughout the cervical spine. Disc space narrowing is greatest from C5-6 through T1-2. Facet hypertrophic changes are greatest from C2-3 through C5-6. Upper chest:  Airway is patent.  Lung apices are clear. Other: Reconstructed images demonstrate no additional findings. IMPRESSION: 1. Small acute minimally displaced fracture through an anterior osteophyte off the inferior margin of the C4 vertebral body, compatible with hyperextension type injury. 2. No other acute bony abnormalities. 3. Multilevel cervical degenerative changes as above. Critical Value/emergent results were called by telephone at the time of interpretation on 04/23/2024 at 3:55 pm to provider Artist Kerns MD, who verbally acknowledged these results. Electronically Signed   By: Ozell Daring M.D.   On: 04/23/2024 15:57   CT Maxillofacial Wo Contrast Result Date: 04/23/2024 CLINICAL DATA:  Clemens, periorbital ecchymosis EXAM: CT MAXILLOFACIAL WITHOUT CONTRAST TECHNIQUE: Multidetector CT imaging of the maxillofacial structures was performed. Multiplanar CT image reconstructions were also generated. RADIATION DOSE REDUCTION: This exam was performed according to the departmental dose-optimization program which includes automated exposure control, adjustment of the mA and/or kV according to patient size and/or use of iterative reconstruction technique. COMPARISON:  None Available. FINDINGS: Osseous: No fracture or mandibular dislocation. No destructive process. Orbits: Mild left periorbital soft tissue swelling. No other acute traumatic finding. The globes are intact. Sinuses: Clear. Soft tissues: Soft tissue swelling along the nasal bridge. Small left frontal scalp hematoma and laceration. No underlying fracture. Limited intracranial: No significant or unexpected finding. IMPRESSION: 1. Soft tissue swelling in the left frontal scalp, left periorbital region, and along the nasal bridge. 2. No acute displaced fracture. Electronically Signed   By: Ozell Daring M.D.   On: 04/23/2024 15:55   CT Head Wo Contrast Result Date: 04/23/2024 CLINICAL DATA:  Clemens, hit head, periorbital bruising EXAM: CT HEAD WITHOUT  CONTRAST TECHNIQUE: Contiguous axial images were obtained from the base of the skull through the vertex without intravenous contrast. RADIATION DOSE REDUCTION: This exam was performed according to the departmental dose-optimization program which includes automated exposure control, adjustment of the mA and/or kV according to patient size and/or use of iterative reconstruction technique. COMPARISON:  12/17/2019 FINDINGS: Brain: No acute infarct or hemorrhage. Dense calcifications of the basal ganglia are again noted. Lateral ventricles and remaining midline structures are unremarkable. There are no acute extra-axial fluid collections. No mass effect. Stable calcifications along the falx and tentorium. Vascular: No hyperdense vessel or unexpected calcification. Skull: Small left frontal scalp hematoma and laceration. No underlying fracture. The remainder of the calvarium is unremarkable. Sinuses/Orbits: Soft tissue swelling extending along the nasal bridge. Paranasal sinuses are unremarkable. No evidence of acute orbital injury. Other: None. IMPRESSION: 1. Small left frontal scalp hematoma and laceration. No underlying fracture. 2. Soft tissue swelling along the nasal bridge. 3. No acute intracranial process. Electronically Signed   By: Ozell Daring M.D.   On: 04/23/2024 15:53   DG Hand Complete Left Result Date: 04/23/2024 CLINICAL DATA:  Fall with laceration to fourth digit EXAM: LEFT HAND - COMPLETE 3+ VIEW COMPARISON:  None Available. FINDINGS: Limited by metallic ring artifact third digit. No malalignment. Advanced arthritis at the first [facility] joint and STT interval with moderate advanced radiocarpal degenerative changes. Moderate diffuse joint space narrowing involving the first MCP joint,  as well as the D IP and PIP joints. Periosteal lucency and irregularity involving the radial shaft of the fourth proximal phalanx with amorphous densities on the oblique view. Chondrocalcinosis. IMPRESSION: 1. Periosteal  lucency and irregularity involving the radial shaft of the fourth proximal phalanx with amorphous densities on the oblique view, findings are indeterminate for fracture versus infection. 2. Arthritis of the hand and wrist. Electronically Signed   By: Luke Bun M.D.   On: 04/23/2024 15:26   PROCEDURES: Critical Care performed: No .Laceration Repair  Date/Time: 04/23/2024 7:32 PM  Performed by: Jossie Artist POUR, MD Authorized by: Jossie Artist POUR, MD   Consent:    Consent obtained:  Verbal   Consent given by:  Patient   Risks, benefits, and alternatives were discussed: yes     Risks discussed:  Infection, pain, retained foreign body, need for additional repair, poor cosmetic result, tendon damage, vascular damage, poor wound healing and nerve damage   Alternatives discussed:  No treatment, delayed treatment, observation and referral Universal protocol:    Immediately prior to procedure, a time out was called: yes     Patient identity confirmed:  Verbally with patient Anesthesia:    Anesthesia method:  Local infiltration   Local anesthetic:  Lidocaine  1% w/o epi Laceration details:    Location:  Face   Face location:  L eyebrow   Length (cm):  3.5   Depth (mm):  7 Pre-procedure details:    Preparation:  Patient was prepped and draped in usual sterile fashion Exploration:    Wound exploration: entire depth of wound visualized     Contaminated: no   Treatment:    Area cleansed with:  Povidone-iodine and saline   Amount of cleaning:  Standard   Irrigation solution:  Sterile saline   Irrigation method:  Syringe Skin repair:    Repair method:  Sutures   Suture size:  5-0   Suture material:  Nylon   Suture technique:  Simple interrupted   Number of sutures:  8 Approximation:    Approximation:  Close Repair type:    Repair type:  Simple Post-procedure details:    Dressing:  Antibiotic ointment and non-adherent dressing   Procedure completion:  Tolerated well, no immediate  complications .Laceration Repair  Date/Time: 04/23/2024 7:34 PM  Performed by: Jossie Artist POUR, MD Authorized by: Jossie Artist POUR, MD   Consent:    Consent obtained:  Verbal   Consent given by:  Patient   Risks, benefits, and alternatives were discussed: yes     Risks discussed:  Infection, pain, retained foreign body, need for additional repair, poor cosmetic result, tendon damage, vascular damage, poor wound healing and nerve damage   Alternatives discussed:  No treatment, delayed treatment, observation and referral Universal protocol:    Immediately prior to procedure, a time out was called: yes     Patient identity confirmed:  Verbally with patient Anesthesia:    Anesthesia method:  Local infiltration Laceration details:    Location:  Hand   Hand location:  L hand, dorsum   Length (cm):  2   Depth (mm):  3 Pre-procedure details:    Preparation:  Patient was prepped and draped in usual sterile fashion Exploration:    Wound exploration: entire depth of wound visualized     Contaminated: no   Treatment:    Area cleansed with:  Povidone-iodine and saline   Amount of cleaning:  Standard   Irrigation solution:  Sterile saline   Irrigation method:  Syringe Skin repair:    Repair method:  Tissue adhesive and Steri-Strips   Number of Steri-Strips:  3 Approximation:    Approximation:  Close Post-procedure details:    Dressing:  Antibiotic ointment and non-adherent dressing   Procedure completion:  Tolerated well, no immediate complications  MEDICATIONS ORDERED IN ED: Medications  lidocaine  (PF) (XYLOCAINE ) 1 % injection 5 mL (5 mLs Intradermal Given by Other 04/23/24 1756)  bacitracin  ointment (1 Application Topical Given 04/23/24 1856)   IMPRESSION / MDM / ASSESSMENT AND PLAN / ED COURSE  I reviewed the triage vital signs and the nursing notes.                             The patient is on the cardiac monitor to evaluate for evidence of arrhythmia and/or significant  heart rate changes. Patient's presentation is most consistent with acute presentation with potential threat to life or bodily function. Patient is an 89 year old female with the above-stated past medical history presents after mechanical fall from standing.  Patient had lacerations above the left eyebrow and over the left hand that were repaired at bedside.  Please see procedure notes for full details.  Patient's CT of the head/C-spine/maxillofacial structures show a small anterior osteophyte fracture along the inferior portion of C4.  I have spoken to Dr. Penne Sharps in neurosurgery who recommends a soft collar and follow-up in his office for further clearance.  Will provide pain control with a short course of oxycodone .  Patient also given instructions for laceration care and all questions were answered prior to discharge.  Patient was given strict return precautions and patient agrees with plan for discharge at this time with outpatient neurosurgical follow-up  Dispo: Discharge home with PCP and neurosurgical follow-up   FINAL CLINICAL IMPRESSION(S) / ED DIAGNOSES   Final diagnoses:  Fall, initial encounter  Other closed displaced fracture of fourth cervical vertebra, initial encounter (HCC)  Eyebrow laceration, left, initial encounter  Laceration of left hand without foreign body, initial encounter   Rx / DC Orders   ED Discharge Orders          Ordered    oxyCODONE  (OXY IR/ROXICODONE ) 5 MG immediate release tablet  Every 4 hours PRN        04/23/24 1844           Note:  This document was prepared using Dragon voice recognition software and may include unintentional dictation errors.   Jossie Artist POUR, MD 04/23/24 [phone]

## 2024-04-24 NOTE — ED Notes (Addendum)
 04/24/2024--0915  [facility] pharmacy called to question the oxycodone  rx.  They said patient already takes norco 7.5/325 twice a day chronically.  Per Dr. Waymond cancel the oxycodone .  [facility] will inform patient and advise to follow up with pcp.

## 2024-05-26 ENCOUNTER — Other Ambulatory Visit: Payer: Self-pay

## 2024-05-26 DIAGNOSIS — T148XXA Other injury of unspecified body region, initial encounter: Secondary | ICD-10-CM

## 2024-05-26 NOTE — Progress Notes (Addendum)
 "  Referring Physician:  Diedra Lame, MD [phone] [address] [facility] [place],  [place] [address]  Primary Physician:  Diedra Lame, MD  History of Present Illness: 05/29/2024 Ms. Cynthia Mahoney has a history of HTN, carotid stenosis, hypothyroidism, hyperlipidemia, asthma, GERD.   Seen in ED on 04/23/24 after fall at home. CT showed small anterior osteophyte fracture of C4. ED reviewed with Dr. Claudene. He recommended soft collar.   She is here for follow up.   She has no neck pain. She has no arm pain. No numbness, tingling, or weakness. She has been wearing her collar- it is causing skin irritation.   She has chronic right shoulder pain- history of rotator cuff surgery. She notes some pain in left shoulder.   She takes hydrocodone  bid for her lower back pain.   Tobacco use:  Does not smoke.   Bowel/Bladder Dysfunction: none  Conservative measures:  Physical therapy: has not participated in Multimodal medical therapy including regular antiinflammatories:  Oxycodone , Hydrocodone , Gabapentin , Skelaxin Injections:   01/30/2024: RFA to the bilateral L5-S1 facet joints (no relief) 01/03/2024: MBB to the bilateral L5-S1 facet joints (8/10 to 0/10) 12/19/2023: MBB to the bilateral L5-S1 facet joints (8/10 to 0/10) 05/16/2022: Right L5-S1 and right S1 transforaminal ESI (no relief) 01/10/2021: MBB to the bilateral L5-S1 facet joints (10/10 to minimal relief) 06/22/2020: Bilateral L4-5 transforaminal ESI (minimal relief, ODI: 36%) 05/25/2020: Bilateral S1 transforaminal ESI (no relief, ODI: 27%) 05/20/2020: Right trochanteric bursa injection (no relief, Dr. Diedra) 11/19/2012: Right L5-S1 and right S1 transforaminal ESI 10/15/2012: Right L4-5 and right S1 transforaminal ESI 08/07/2012: Right L4-5 and right S1 transforaminal ESI   Past Surgery:back surgery  Cynthia Mahoney has no symptoms of cervical myelopathy.  The symptoms are causing a  significant impact on the patient's life.   Review of Systems:  A 10 point review of systems is negative, except for the pertinent positives and negatives detailed in the HPI.  Past Medical History: Past Medical History:  Diagnosis Date   Anxiety    Arthritis    Asthma    GERD (gastroesophageal reflux disease)    Headache(784.0)    stress   Hypertension    Hypothyroidism    PONV (postoperative nausea and vomiting)    Vertigo    Wears dentures    full upper    Past Surgical History: Past Surgical History:  Procedure Laterality Date   CARDIAC CATHETERIZATION     [facility]   CATARACT EXTRACTION W/PHACO Right 04/11/2021   Procedure: CATARACT EXTRACTION PHACO AND INTRAOCULAR LENS PLACEMENT (IOC) RIGHT;  Surgeon: Novak Adine Anes, MD;  Location: [facility];  Service: Ophthalmology;  Laterality: Right;  3.74 00:30.8   CATARACT EXTRACTION W/PHACO Left 04/25/2021   Procedure: CATARACT EXTRACTION PHACO AND INTRAOCULAR LENS PLACEMENT (IOC) LEFT;  Surgeon: Novak Adine Anes, MD;  Location: [facility];  Service: Ophthalmology;  Laterality: Left;  3.27 00:29.5   JOINT REPLACEMENT Bilateral 2008, 2013   knee   SHOULDER ARTHROSCOPY WITH OPEN ROTATOR CUFF REPAIR Right 04/2012   TONSILLECTOMY      Allergies: Allergies as of 05/29/2024 - Review Complete 05/29/2024  Allergen Reaction Noted   Azithromycin  12/07/2020   Clavulanic acid  12/07/2020   Amoxicillin Hives and Rash 01/06/2013   Erythromycin Hives and Rash 01/06/2013    Medications: Outpatient Encounter Medications as of 05/29/2024  Medication Sig   amLODipine  (NORVASC ) 5 MG tablet Take 5 mg by mouth  daily.   aspirin  81 MG tablet Take 81 mg by mouth daily.   brimonidine  (ALPHAGAN ) 0.2 % ophthalmic solution Place 1 drop into both eyes 2 (two) times daily.    cetirizine (ZYRTEC) 10 MG tablet TAKE 1 TABLET (10 MG TOTAL) BY MOUTH ONCE DAILY   dorzolamide -timolol  (COSOPT ) 22.3-6.8 MG/ML  ophthalmic solution Place 1 drop into both eyes 2 (two) times daily.   Doxylamine Succinate, Sleep, (SLEEP AID PO) Take 1 tablet by mouth at bedtime.   furosemide  (LASIX ) 40 MG tablet Take 0.5 tablets (20 mg total) by mouth every morning.   gabapentin  (NEURONTIN ) 300 MG capsule Take 300-600 mg by mouth 3 (three) times daily.    HYDROcodone -acetaminophen  (NORCO) 7.5-325 MG tablet Take 0.5-1 tablets by mouth 2 (two) times daily as needed.   latanoprost  (XALATAN ) 0.005 % ophthalmic solution Place 1 drop into both eyes at bedtime.   levothyroxine  (SYNTHROID , LEVOTHROID) 88 MCG tablet Take 100 mcg by mouth daily before breakfast.   losartan (COZAAR) 50 MG tablet Take 50 mg by mouth daily.   metoprolol  tartrate (LOPRESSOR ) 50 MG tablet Take 1 tablet (50 mg total) by mouth 2 (two) times daily.   montelukast  (SINGULAIR ) 10 MG tablet Take 1 tablet by mouth at bedtime.    pantoprazole  (PROTONIX ) 40 MG tablet Take 40 mg by mouth daily.   potassium chloride  (KLOR-CON ) 10 MEQ tablet Take 10 mEq by mouth daily.   pravastatin  (PRAVACHOL ) 40 MG tablet Take 40 mg by mouth at bedtime.    TRELEGY ELLIPTA 200-62.5-25 MCG/ACT AEPB INHALE 1 PUFF ONE TIME DAILY   [DISCONTINUED] albuterol  (VENTOLIN  HFA) 108 (90 Base) MCG/ACT inhaler SMARTSIG:2 Puff(s) By Mouth Every 6 Hours PRN   [DISCONTINUED] budesonide  (PULMICORT ) 0.5 MG/2ML nebulizer solution Take by nebulization.   [DISCONTINUED] busPIRone  (BUSPAR ) 10 MG tablet Take 10 mg by mouth 2 (two) times daily.   [DISCONTINUED] calcium  carbonate (OS-CAL) 600 MG TABS tablet Take 600 mg by mouth daily. (Patient not taking: Reported on 05/29/2024)   [DISCONTINUED] magnesium  oxide (MAG-OX) 400 MG tablet Take 400 mg by mouth daily.   [DISCONTINUED] Multiple Vitamin (MULTIVITAMIN WITH MINERALS) TABS tablet Take 1 tablet by mouth daily.   [DISCONTINUED] Multiple Vitamins-Minerals (VISION FORMULA/LUTEIN PO) Take 1 tablet by mouth daily.   [DISCONTINUED] omeprazole (PRILOSEC) 20 MG  capsule Take 20 mg by mouth 2 (two) times daily before a meal.    [DISCONTINUED] ondansetron  (ZOFRAN -ODT) 4 MG disintegrating tablet Take 4 mg by mouth every 8 (eight) hours as needed.   [DISCONTINUED] oxyCODONE  (OXY IR/ROXICODONE ) 5 MG immediate release tablet Take 1 tablet (5 mg total) by mouth every 4 (four) hours as needed for severe pain (pain score 7-10).   No facility-administered encounter medications on file as of 05/29/2024.    Social History: Social History[1]  Family Medical History: Family History  Problem Relation Age of Onset   Breast cancer Neg Hx     Physical Examination: Vitals:   05/29/24 1316  BP: 136/88    General: Patient is well developed, well nourished, calm, collected, and in no apparent distress. Attention to examination is appropriate.  Respiratory: Patient is breathing without any difficulty.   NEUROLOGICAL:     Awake, alert, oriented to person, place, and time.  Speech is clear and fluent. Fund of knowledge is appropriate.   Cranial Nerves: Pupils equal round and reactive to light.  Facial tone is symmetric.    No posterior cervical tenderness.   She has limited ROM of left shoulder with no pain. She  has limited ROM of right shoulder with mild pain.   No abnormal lesions on exposed skin.   Strength: Side Biceps Triceps Deltoid Interossei Grip Wrist Ext. Wrist Flex.  R 5 5 5 5 5 5 5   L 5 5 5 5 5 5 5    Side Iliopsoas Quads Hamstring PF DF EHL  R 5 5 5 5 5 5   L 5 5 5 5 5 5    Reflexes are 2+ and symmetric at the biceps, brachioradialis, patella and achilles.   Hoffman's is absent.  Clonus is not present.   Bilateral upper and lower extremity sensation is intact to light touch.     She ambulates slowly with a cane.   Medical Decision Making  Imaging: Cervical xrays dated 05/29/24:  Difficulty to visualize fracture at C4, no gross instability noted.   Report for above xrays not yet available.    Cervical spine CT dated 04/23/24:   FINDINGS: Alignment: Alignment is grossly anatomic.   Skull base and vertebrae: There is a cortical discontinuity through the anterior osteophyte off the inferior margin of the C4 vertebral body, reference image 33/7, which could reflect an acute minimally displaced fracture. No other acute displaced fractures. No destructive bony abnormalities.   Soft tissues and spinal canal: No prevertebral fluid or swelling. No visible canal hematoma.   Disc levels: There is diffuse spondylosis and facet hypertrophy throughout the cervical spine. Disc space narrowing is greatest from C5-6 through T1-2. Facet hypertrophic changes are greatest from C2-3 through C5-6.   Upper chest: Airway is patent.  Lung apices are clear.   Other: Reconstructed images demonstrate no additional findings.   IMPRESSION: 1. Small acute minimally displaced fracture through an anterior osteophyte off the inferior margin of the C4 vertebral body, compatible with hyperextension type injury. 2. No other acute bony abnormalities. 3. Multilevel cervical degenerative changes as above.   Critical Value/emergent results were called by telephone at the time of interpretation on 04/23/2024 at 3:55 pm to provider Artist Kerns MD, who verbally acknowledged these results.     Electronically Signed   By: Ozell Daring M.D.   On: 04/23/2024 15:57       I have personally reviewed the images and agree with the above interpretation.  Assessment and Plan: Ms. Spera had a fall on 04/23/24 and has known small anterior osteophyte fracture of C4.   She has no neck pain. She has no arm pain. No numbness, tingling, or weakness. She has been wearing her collar- it is causing skin irritation.   She has chronic right shoulder pain- history of rotator cuff surgery. She notes some pain in left shoulder.   Xrays from today show no gross instability.   Treatment options discussed with patient and following plan made:   - Will  review xrays with Dr. Claudene and call her tomorrow.  - She should continue to wear her collar when up and walking. No lifting over 10 pounds.  - She has hair appointment tomorrow and cannot hyperextend her neck to get hair washed yet.  - Since fall, she notes limited ROM of left shoulder but no pain. I recommend referral to ortho and she declines. Will let me know if she changes her mind.  - I will call her with an update tomorrow. If she does not answer okay to call her daughter Nathanel.   I spent a total of 30 minutes in face-to-face and non-face-to-face activities related to this patient's care today including review of outside records,  review of imaging, review of symptoms, physical exam, discussion of differential diagnosis, discussion of treatment options, and documentation.   Thank you for involving me in the care of this patient.   ADDENDUM 05/29/24:  Xrays reviewed with Dr. Claudene. She can d/c collar. Okay to get hair washed at hair salon. I called and let her know. She will f/u prn.   ADDENDUM 06/05/24:  Cervical xrays dated 05/29/24:  FINDINGS:   BONES: Vertebral body heights are maintained. 2 mm of retrolisthesis at C3-C4 is seen on neutral and extension imaging. The level is anatomic at this level during flexion.   DISCS AND DEGENERATIVE CHANGES: There is moderate disc space narrowing and endplate osteophyte formation at C4-C5, C5-C6 and C6-C7 compatible with degenerative change similar to prior.   SOFT TISSUES: No prevertebral soft tissue swelling. The visualized lungs appear clear.   IMPRESSION: 1. 2 mm retrolisthesis at C3-C4 on neutral and extension views, with anatomic alignment on flexion. 2. Moderate degenerative changes at C4-C5, C5-C6, and C6-C7, similar to prior.   Electronically signed by: Greig Pique MD 06/04/2024 06:49 PM EST RP Workstation: [id number]  Xrays reviewed with Dr. Claudene. He recommends patient follow up in 3 months to be sure retrolisthesis at  C3-C4 is not getting worse. Will call and let her know.     Glade Boys PA-C Dept. of Neurosurgery      [1]  Social History Tobacco Use   Smoking status: Never   Smokeless tobacco: Never  Vaping Use   Vaping status: Never Used  Substance Use Topics   Alcohol use: No   Drug use: No   "

## 2024-05-27 NOTE — Addendum Note (Signed)
 Addended by: GIRARD DON GAILS on: 05/27/2024 01:21 PM   Modules accepted: Orders

## 2024-05-29 ENCOUNTER — Encounter: Payer: Self-pay | Admitting: Orthopedic Surgery

## 2024-05-29 ENCOUNTER — Ambulatory Visit

## 2024-05-29 ENCOUNTER — Ambulatory Visit: Admitting: Orthopedic Surgery

## 2024-05-29 VITALS — BP 136/88 | Ht 61.0 in | Wt 164.0 lb

## 2024-05-29 DIAGNOSIS — T148XXA Other injury of unspecified body region, initial encounter: Secondary | ICD-10-CM

## 2024-05-29 DIAGNOSIS — S12390A Other displaced fracture of fourth cervical vertebra, initial encounter for closed fracture: Secondary | ICD-10-CM | POA: Diagnosis not present

## 2024-05-29 DIAGNOSIS — W19XXXA Unspecified fall, initial encounter: Secondary | ICD-10-CM | POA: Diagnosis not present

## 2024-05-29 NOTE — Patient Instructions (Signed)
 It was so nice to see you today. Thank you so much for coming in.    You have a broken bone in your neck.   I want the radiologist to review your xrays before we decide on removing your collar.   You still need to wear your collar when up and walking. Do not lay back for the hair dresser to wash your hair. No lifting more than 10 pounds.   I will call you tomorrow and update you.   Please do not hesitate to call if you have any questions or concerns. You can also message me in MyChart.   Glade Boys PA-C [phone]     The physicians and staff at [facility] at [facility] are committed to providing excellent care. You may receive a survey asking for feedback about your experience at our office. We value you your feedback and appreciate you taking the time to to fill it out. The [facility] leadership team is also available to discuss your experience in person, feel free to contact us  [phone].

## 2024-06-05 ENCOUNTER — Telehealth: Payer: Self-pay | Admitting: Orthopedic Surgery

## 2024-06-05 DIAGNOSIS — S12390A Other displaced fracture of fourth cervical vertebra, initial encounter for closed fracture: Secondary | ICD-10-CM

## 2024-06-05 NOTE — Telephone Encounter (Signed)
 Please call and let her know that her xray report is back and shows she has some wear and tear in her neck with slight slip.   Dr. Claudene recommends she follow up in 3 months with repeat cervical xrays with flex/ext. Please let her know and schedule. Can be with me on day Claudene is in clinic.

## 2024-06-06 NOTE — Addendum Note (Signed)
 Addended by: GIRARD DON GAILS on: 06/06/2024 09:16 AM   Modules accepted: Orders

## 2024-06-06 NOTE — Telephone Encounter (Signed)
 Called patient but voicemail is full. Called Kathy-ok per DPR on file and advised of this information. Appointment scheduled for follow up and xrays

## 2024-08-25 ENCOUNTER — Ambulatory Visit: Admitting: Orthopedic Surgery

## 2024-08-25 ENCOUNTER — Other Ambulatory Visit
